# Patient Record
Sex: Female | Born: 1965
Health system: Southern US, Community
[De-identification: ages and names within clinical notes are randomized; demographics above are authoritative.]

## PROBLEM LIST (undated history)

## (undated) DIAGNOSIS — I509 Heart failure, unspecified: Secondary | ICD-10-CM

## (undated) DIAGNOSIS — K219 Gastro-esophageal reflux disease without esophagitis: Secondary | ICD-10-CM

## (undated) DIAGNOSIS — I82409 Acute embolism and thrombosis of unspecified deep veins of unspecified lower extremity: Secondary | ICD-10-CM

## (undated) DIAGNOSIS — I2699 Other pulmonary embolism without acute cor pulmonale: Secondary | ICD-10-CM

## (undated) DIAGNOSIS — R197 Diarrhea, unspecified: Secondary | ICD-10-CM

## (undated) DIAGNOSIS — J302 Other seasonal allergic rhinitis: Secondary | ICD-10-CM

## (undated) DIAGNOSIS — C801 Malignant (primary) neoplasm, unspecified: Secondary | ICD-10-CM

## (undated) DIAGNOSIS — I10 Essential (primary) hypertension: Secondary | ICD-10-CM

## (undated) HISTORY — PX: WISDOM TOOTH EXTRACTION: SHX21

---

## 2000-03-06 ENCOUNTER — Other Ambulatory Visit: Admission: RE | Admit: 2000-03-06 | Discharge: 2000-03-06 | Payer: Self-pay | Admitting: *Deleted

## 2001-08-01 ENCOUNTER — Other Ambulatory Visit: Admission: RE | Admit: 2001-08-01 | Discharge: 2001-08-01 | Payer: Self-pay | Admitting: *Deleted

## 2014-10-20 ENCOUNTER — Emergency Department
Admission: EM | Admit: 2014-10-20 | Discharge: 2014-10-20 | Disposition: A | Discharge status: Home / Self Care | Payer: Self-pay | Source: Home / Self Care | Attending: Family Medicine | Admitting: Family Medicine

## 2014-10-20 ENCOUNTER — Encounter: Payer: Self-pay | Admitting: *Deleted

## 2014-10-20 DIAGNOSIS — R300 Dysuria: Secondary | ICD-10-CM

## 2014-10-20 DIAGNOSIS — N3090 Cystitis, unspecified without hematuria: Secondary | ICD-10-CM

## 2014-10-20 HISTORY — DX: Other seasonal allergic rhinitis: J30.2

## 2014-10-20 HISTORY — DX: Gastro-esophageal reflux disease without esophagitis: K21.9

## 2014-10-20 LAB — POCT URINALYSIS DIP (MANUAL ENTRY)
Nitrite, UA: POSITIVE
Protein Ur, POC: >=300
SPEC GRAV UA: 1.015 (ref 1.005–1.03)
Urobilinogen, UA: 2 (ref 0–1)
pH, UA: 5 (ref 5–8)

## 2014-10-20 MED ORDER — SULFAMETHOXAZOLE-TRIMETHOPRIM 800-160 MG PO TABS: 1.0000 | ORAL_TABLET | Freq: Two times a day (BID) | ORAL | Status: DC

## 2014-10-20 NOTE — Discharge Instructions (Signed)
Increase fluid intake. If symptoms become significantly worse during the night or over the weekend, proceed to the local emergency room.    Urinary Tract Infection Urinary tract infections (UTIs) can develop anywhere along your urinary tract. Your urinary tract is your body's drainage system for removing wastes and extra water. Your urinary tract includes two kidneys, two ureters, a bladder, and a urethra. Your kidneys are a pair of bean-shaped organs. Each kidney is about the size of your fist. They are located below your ribs, one on each side of your spine. CAUSES Infections are caused by microbes, which are microscopic organisms, including fungi, viruses, and bacteria. These organisms are so small that they can only be seen through a microscope. Bacteria are the microbes that most commonly cause UTIs. SYMPTOMS  Symptoms of UTIs may vary by age and gender of the patient and by the location of the infection. Symptoms in young women typically include a frequent and intense urge to urinate and a painful, burning feeling in the bladder or urethra during urination. Older women and men are more likely to be tired, shaky, and weak and have muscle aches and abdominal pain. A fever may mean the infection is in your kidneys. Other symptoms of a kidney infection include pain in your back or sides below the ribs, nausea, and vomiting. DIAGNOSIS To diagnose a UTI, your caregiver will ask you about your symptoms. Your caregiver also will ask to provide a urine sample. The urine sample will be tested for bacteria and white blood cells. White blood cells are made by your body to help fight infection. TREATMENT  Typically, UTIs can be treated with medication. Because most UTIs are caused by a bacterial infection, they usually can be treated with the use of antibiotics. The choice of antibiotic and length of treatment depend on your symptoms and the type of bacteria causing your infection. HOME CARE  INSTRUCTIONS  If you were prescribed antibiotics, take them exactly as your caregiver instructs you. Finish the medication even if you feel better after you have only taken some of the medication.  Drink enough water and fluids to keep your urine clear or pale yellow.  Avoid caffeine, tea, and carbonated beverages. They tend to irritate your bladder.  Empty your bladder often. Avoid holding urine for long periods of time.  Empty your bladder before and after sexual intercourse.  After a bowel movement, women should cleanse from front to back. Use each tissue only once. SEEK MEDICAL CARE IF:   You have back pain.  You develop a fever.  Your symptoms do not begin to resolve within 3 days. SEEK IMMEDIATE MEDICAL CARE IF:   You have severe back pain or lower abdominal pain.  You develop chills.  You have nausea or vomiting.  You have continued burning or discomfort with urination. MAKE SURE YOU:   Understand these instructions.  Will watch your condition.  Will get help right away if you are not doing well or get worse. Document Released: 07/04/2005 Document Revised: 03/25/2012 Document Reviewed: 11/02/2011 [facility] Patient Information 2015 [place], [place]. This information is not intended to replace advice given to you by your health care provider. Make sure you discuss any questions you have with your health care provider.

## 2014-10-20 NOTE — ED Notes (Signed)
Pt c/o dysuria, pressure in her lower abd and urinary frequency x 3 days. Denies fever.

## 2014-10-20 NOTE — ED Provider Notes (Signed)
CSN: [id number]     Arrival date & time 10/20/14  1620 History   First MD Initiated Contact with Patient 10/20/14 1647     Chief Complaint  Patient presents with  . Dysuria      HPI Comments: Patient complains of 3 day history of dysuria, frequency, nocturia, and lower abdominal pressure.  Patient's last menstrual period was 09/30/2014.   Patient is a 49 y.o. female presenting with dysuria. The history is provided by the patient.  Dysuria Pain quality:  Burning Pain severity:  Mild Onset quality:  Gradual Duration:  3 days Timing:  Constant Progression:  Worsening Chronicity:  New Recent urinary tract infections: yes   Relieved by:  Nothing Worsened by:  Nothing tried Ineffective treatments:  Cranberry juice Urinary symptoms: frequent urination and hesitancy   Urinary symptoms: no discolored urine, no foul-smelling urine, no hematuria and no bladder incontinence   Associated symptoms: no abdominal pain, no fever, no flank pain, no genital lesions, no nausea, no vaginal discharge and no vomiting   Risk factors: recurrent urinary tract infections     Past Medical History  Diagnosis Date  . GERD (gastroesophageal reflux disease)   . Seasonal allergies    History reviewed. No pertinent past surgical history. Family History  Problem Relation Age of Onset  . COPD Mother   . Rheum arthritis Mother   . Hypertension Father    History  Substance Use Topics  . Smoking status: Current Every Day Smoker -- 1.00 packs/day    Types: Cigarettes  . Smokeless tobacco: Not on file  . Alcohol Use: Yes   OB History    No data available     Review of Systems  Constitutional: Negative for fever.  Gastrointestinal: Negative for nausea, vomiting and abdominal pain.  Genitourinary: Positive for dysuria. Negative for flank pain and vaginal discharge.  All other systems reviewed and are negative.   Allergies  Review of patient's allergies indicates no known allergies.  Home  Medications   Prior to Admission medications   Medication Sig Start Date End Date Taking? Authorizing Provider  lansoprazole (PREVACID) 30 MG capsule Take 30 mg by mouth daily at 12 noon.   Yes Historical Provider, MD  loratadine-pseudoephedrine (CLARITIN-D 24-HOUR) 10-240 MG per 24 hr tablet Take 1 tablet by mouth daily.   Yes Historical Provider, MD  sulfamethoxazole-trimethoprim (BACTRIM DS,SEPTRA DS) 800-160 MG per tablet Take 1 tablet by mouth 2 (two) times daily. 10/20/14   Kandra Nicolas, MD   BP 189/133 mmHg  Pulse 115  Temp(Src) 98 F (36.7 C) (Oral)  Resp 18  Ht 5\' 7"  (1.702 m)  Wt 184 lb (83.462 kg)  BMI 28.81 kg/m2  SpO2 98%  LMP 09/30/2014 Physical Exam Nursing notes and Vital Signs reviewed. Appearance:  Patient appears stated age, and in no acute distress Eyes:  Pupils are equal, round, and reactive to light and accomodation.  Extraocular movement is intact.  Conjunctivae are not inflamed  Pharynx:  Normal Neck:  Supple.  No adenopathy  Lungs:  Clear to auscultation.  Breath sounds are equal.  Heart:  Regular rate and rhythm without murmurs, rubs, or gallops.  Abdomen:  Nontender without masses or hepatosplenomegaly.  Bowel sounds are present.  No CVA or flank tenderness.  Extremities:  No edema.  No calf tenderness Skin:  No rash present.   ED Course  Procedures  None    Labs Reviewed  URINE CULTURE  POCT URINALYSIS DIP (MANUAL ENTRY):  Note that urinalysis dipstick  results may be inaccurate because of patient's use of AZO       MDM   1. Dysuria   2. Cystitis    Urine culture pending. Begin Bactrim DS BID for one week. Increase fluid intake. If symptoms become significantly worse during the night or over the weekend, proceed to the local emergency room.  Followup with Family Doctor if not improved in one week.     Kandra Nicolas, MD 10/24/14 2036

## 2014-10-22 LAB — URINE CULTURE

## 2014-10-25 ENCOUNTER — Telehealth: Payer: Self-pay | Admitting: *Deleted

## 2017-05-12 DIAGNOSIS — R030 Elevated blood-pressure reading, without diagnosis of hypertension: Secondary | ICD-10-CM | POA: Diagnosis not present

## 2017-05-12 DIAGNOSIS — J209 Acute bronchitis, unspecified: Secondary | ICD-10-CM | POA: Diagnosis not present

## 2017-05-12 DIAGNOSIS — R0609 Other forms of dyspnea: Secondary | ICD-10-CM | POA: Diagnosis not present

## 2017-05-12 DIAGNOSIS — R600 Localized edema: Secondary | ICD-10-CM | POA: Diagnosis not present

## 2017-05-21 ENCOUNTER — Encounter (HOSPITAL_BASED_OUTPATIENT_CLINIC_OR_DEPARTMENT_OTHER): Payer: Self-pay | Admitting: Respiratory Therapy

## 2017-05-21 ENCOUNTER — Inpatient Hospital Stay (HOSPITAL_BASED_OUTPATIENT_CLINIC_OR_DEPARTMENT_OTHER)
Admission: EM | Admit: 2017-05-21 | Discharge: 2017-06-01 | DRG: 286 | Disposition: A | Discharge status: Home / Self Care | Payer: Medicaid Other | Attending: Family Medicine | Admitting: Family Medicine

## 2017-05-21 ENCOUNTER — Emergency Department (HOSPITAL_BASED_OUTPATIENT_CLINIC_OR_DEPARTMENT_OTHER): Payer: Medicaid Other

## 2017-05-21 DIAGNOSIS — I5043 Acute on chronic combined systolic (congestive) and diastolic (congestive) heart failure: Secondary | ICD-10-CM | POA: Diagnosis present

## 2017-05-21 DIAGNOSIS — I82C11 Acute embolism and thrombosis of right internal jugular vein: Secondary | ICD-10-CM | POA: Diagnosis present

## 2017-05-21 DIAGNOSIS — N179 Acute kidney failure, unspecified: Secondary | ICD-10-CM | POA: Diagnosis present

## 2017-05-21 DIAGNOSIS — R490 Dysphonia: Secondary | ICD-10-CM

## 2017-05-21 DIAGNOSIS — J181 Lobar pneumonia, unspecified organism: Secondary | ICD-10-CM

## 2017-05-21 DIAGNOSIS — I5041 Acute combined systolic (congestive) and diastolic (congestive) heart failure: Secondary | ICD-10-CM | POA: Diagnosis not present

## 2017-05-21 DIAGNOSIS — N182 Chronic kidney disease, stage 2 (mild): Secondary | ICD-10-CM | POA: Diagnosis present

## 2017-05-21 DIAGNOSIS — I2699 Other pulmonary embolism without acute cor pulmonale: Secondary | ICD-10-CM | POA: Diagnosis present

## 2017-05-21 DIAGNOSIS — R599 Enlarged lymph nodes, unspecified: Secondary | ICD-10-CM | POA: Diagnosis not present

## 2017-05-21 DIAGNOSIS — I420 Dilated cardiomyopathy: Secondary | ICD-10-CM | POA: Diagnosis present

## 2017-05-21 DIAGNOSIS — E1122 Type 2 diabetes mellitus with diabetic chronic kidney disease: Secondary | ICD-10-CM | POA: Diagnosis present

## 2017-05-21 DIAGNOSIS — S91302A Unspecified open wound, left foot, initial encounter: Secondary | ICD-10-CM | POA: Diagnosis present

## 2017-05-21 DIAGNOSIS — Z1239 Encounter for other screening for malignant neoplasm of breast: Secondary | ICD-10-CM

## 2017-05-21 DIAGNOSIS — R590 Localized enlarged lymph nodes: Secondary | ICD-10-CM | POA: Diagnosis present

## 2017-05-21 DIAGNOSIS — I5021 Acute systolic (congestive) heart failure: Secondary | ICD-10-CM | POA: Diagnosis not present

## 2017-05-21 DIAGNOSIS — Z803 Family history of malignant neoplasm of breast: Secondary | ICD-10-CM

## 2017-05-21 DIAGNOSIS — I2510 Atherosclerotic heart disease of native coronary artery without angina pectoris: Secondary | ICD-10-CM | POA: Diagnosis not present

## 2017-05-21 DIAGNOSIS — R570 Cardiogenic shock: Secondary | ICD-10-CM | POA: Diagnosis not present

## 2017-05-21 DIAGNOSIS — Z79899 Other long term (current) drug therapy: Secondary | ICD-10-CM

## 2017-05-21 DIAGNOSIS — I509 Heart failure, unspecified: Secondary | ICD-10-CM

## 2017-05-21 DIAGNOSIS — I428 Other cardiomyopathies: Secondary | ICD-10-CM

## 2017-05-21 DIAGNOSIS — C50912 Malignant neoplasm of unspecified site of left female breast: Secondary | ICD-10-CM | POA: Diagnosis present

## 2017-05-21 DIAGNOSIS — K219 Gastro-esophageal reflux disease without esophagitis: Secondary | ICD-10-CM | POA: Diagnosis present

## 2017-05-21 DIAGNOSIS — I248 Other forms of acute ischemic heart disease: Secondary | ICD-10-CM | POA: Diagnosis present

## 2017-05-21 DIAGNOSIS — N171 Acute kidney failure with acute cortical necrosis: Secondary | ICD-10-CM | POA: Diagnosis not present

## 2017-05-21 DIAGNOSIS — E871 Hypo-osmolality and hyponatremia: Secondary | ICD-10-CM | POA: Diagnosis present

## 2017-05-21 DIAGNOSIS — Z8249 Family history of ischemic heart disease and other diseases of the circulatory system: Secondary | ICD-10-CM

## 2017-05-21 DIAGNOSIS — Z825 Family history of asthma and other chronic lower respiratory diseases: Secondary | ICD-10-CM

## 2017-05-21 DIAGNOSIS — Z6833 Body mass index (BMI) 33.0-33.9, adult: Secondary | ICD-10-CM | POA: Diagnosis not present

## 2017-05-21 DIAGNOSIS — I82409 Acute embolism and thrombosis of unspecified deep veins of unspecified lower extremity: Secondary | ICD-10-CM | POA: Diagnosis not present

## 2017-05-21 DIAGNOSIS — T17908A Unspecified foreign body in respiratory tract, part unspecified causing other injury, initial encounter: Secondary | ICD-10-CM

## 2017-05-21 DIAGNOSIS — R609 Edema, unspecified: Secondary | ICD-10-CM | POA: Diagnosis not present

## 2017-05-21 DIAGNOSIS — I82B11 Acute embolism and thrombosis of right subclavian vein: Secondary | ICD-10-CM | POA: Diagnosis present

## 2017-05-21 DIAGNOSIS — M7989 Other specified soft tissue disorders: Secondary | ICD-10-CM | POA: Diagnosis not present

## 2017-05-21 DIAGNOSIS — J189 Pneumonia, unspecified organism: Secondary | ICD-10-CM | POA: Diagnosis present

## 2017-05-21 DIAGNOSIS — R0602 Shortness of breath: Secondary | ICD-10-CM

## 2017-05-21 DIAGNOSIS — D6859 Other primary thrombophilia: Secondary | ICD-10-CM | POA: Diagnosis not present

## 2017-05-21 DIAGNOSIS — I2602 Saddle embolus of pulmonary artery with acute cor pulmonale: Secondary | ICD-10-CM | POA: Diagnosis not present

## 2017-05-21 DIAGNOSIS — I82629 Acute embolism and thrombosis of deep veins of unspecified upper extremity: Secondary | ICD-10-CM

## 2017-05-21 DIAGNOSIS — R000 Tachycardia, unspecified: Secondary | ICD-10-CM | POA: Diagnosis not present

## 2017-05-21 DIAGNOSIS — I341 Nonrheumatic mitral (valve) prolapse: Secondary | ICD-10-CM | POA: Diagnosis not present

## 2017-05-21 DIAGNOSIS — I82A11 Acute embolism and thrombosis of right axillary vein: Secondary | ICD-10-CM | POA: Diagnosis not present

## 2017-05-21 DIAGNOSIS — I130 Hypertensive heart and chronic kidney disease with heart failure and stage 1 through stage 4 chronic kidney disease, or unspecified chronic kidney disease: Secondary | ICD-10-CM | POA: Diagnosis present

## 2017-05-21 DIAGNOSIS — S91301A Unspecified open wound, right foot, initial encounter: Secondary | ICD-10-CM | POA: Diagnosis present

## 2017-05-21 DIAGNOSIS — I82A12 Acute embolism and thrombosis of left axillary vein: Secondary | ICD-10-CM | POA: Diagnosis not present

## 2017-05-21 DIAGNOSIS — Z87891 Personal history of nicotine dependence: Secondary | ICD-10-CM

## 2017-05-21 DIAGNOSIS — I472 Ventricular tachycardia: Secondary | ICD-10-CM | POA: Diagnosis present

## 2017-05-21 DIAGNOSIS — I5082 Biventricular heart failure: Secondary | ICD-10-CM | POA: Diagnosis present

## 2017-05-21 LAB — CBC
HEMATOCRIT: 41.5 % (ref 36.0–46.0)
HEMOGLOBIN: 12.7 g/dL (ref 12.0–15.0)
MCH: 27.1 pg (ref 26.0–34.0)
MCHC: 30.6 g/dL (ref 30.0–36.0)
MCV: 88.5 fL (ref 78.0–100.0)
Platelets: 269 10*3/uL (ref 150–400)
RBC: 4.69 MIL/uL (ref 3.87–5.11)
RDW: 16.2 % — AB (ref 11.5–15.5)
WBC: 22.7 10*3/uL — AB (ref 4.0–10.5)

## 2017-05-21 LAB — BASIC METABOLIC PANEL
Anion gap: 15 (ref 5–15)
BUN: 26 mg/dL — ABNORMAL HIGH (ref 6–20)
CO2: 25 mmol/L (ref 22–32)
Calcium: 8.6 mg/dL — ABNORMAL LOW (ref 8.9–10.3)
Chloride: 94 mmol/L — ABNORMAL LOW (ref 101–111)
Creatinine, Ser: 1.23 mg/dL — ABNORMAL HIGH (ref 0.44–1.00)
GFR calc Af Amer: 57 mL/min — ABNORMAL LOW (ref >60)
GFR, EST NON AFRICAN AMERICAN: 50 mL/min — AB (ref >60)
GLUCOSE: 166 mg/dL — AB (ref 65–99)
POTASSIUM: 4.3 mmol/L (ref 3.5–5.1)
Sodium: 134 mmol/L — ABNORMAL LOW (ref 135–145)

## 2017-05-21 LAB — URINALYSIS, ROUTINE W REFLEX MICROSCOPIC
Bilirubin Urine: NEGATIVE
Glucose, UA: NEGATIVE mg/dL
Ketones, ur: NEGATIVE mg/dL
Nitrite: NEGATIVE
Protein, ur: 30 mg/dL — AB
SPECIFIC GRAVITY, URINE: 1.01 (ref 1.005–1.030)
pH: 5.5 (ref 5.0–8.0)

## 2017-05-21 LAB — BRAIN NATRIURETIC PEPTIDE: B NATRIURETIC PEPTIDE 5: 2921.6 pg/mL — AB (ref 0.0–100.0)

## 2017-05-21 LAB — TROPONIN I: Troponin I: 0.48 ng/mL (ref <0.03)

## 2017-05-21 LAB — URINALYSIS, MICROSCOPIC (REFLEX)

## 2017-05-21 MED ORDER — FUROSEMIDE 10 MG/ML IJ SOLN
40.0000 mg | Freq: Once | INTRAMUSCULAR | Status: AC
  Administered 2017-05-21: 40 mg via INTRAVENOUS
  Filled 2017-05-21: qty 4

## 2017-05-21 NOTE — ED Notes (Signed)
Pt on monitor 

## 2017-05-21 NOTE — ED Provider Notes (Signed)
[facility] Provider Note   CSN: [id number] Arrival date & time: 05/21/17  2041     History   Chief Complaint Chief Complaint  Patient presents with  . Shortness of Breath    HPI Kylie Bennett is a 51 y.o. female.  Patient is a 51 year old female with no significant past medical history presenting for evaluation of shortness of breath and leg swelling. This has apparently worsened over the past 10 days. She is reporting dyspnea on exertion and orthopnea. She was initially seen in urgent care and diagnosed with bronchitis. She was treated with Augmentin, however has not improved. She returned to urgent care this evening, then sent here for further evaluation. She denies any fevers, chills, productive cough. She denies any chest pain.   The history is provided by the patient.  Shortness of Breath  This is a new problem. The average episode lasts 10 days. The problem occurs continuously.The problem has been gradually worsening. Pertinent negatives include no fever, no cough and no sputum production. Treatments tried: augmentin. Associated medical issues do not include asthma, COPD, PE or CAD.    Past Medical History:  Diagnosis Date  . GERD (gastroesophageal reflux disease)   . Seasonal allergies     There are no active problems to display for this patient.   History reviewed. No pertinent surgical history.  OB History    No data available       Home Medications    Prior to Admission medications   Medication Sig Start Date End Date Taking? Authorizing Provider  amoxicillin (AMOXIL) 500 MG capsule Take 500 mg by mouth 3 (three) times daily.   Yes [provider]  Phenylephrine-Chlorphen-DM (TUSSAFED PO) Take by mouth.   Yes [provider]  lansoprazole (PREVACID) 30 MG capsule Take 30 mg by mouth daily at 12 noon.    [provider]  loratadine-pseudoephedrine (CLARITIN-D 24-HOUR) 10-240 MG per 24 hr tablet Take 1 tablet by mouth  daily.    [provider]  sulfamethoxazole-trimethoprim (BACTRIM DS,SEPTRA DS) 800-160 MG per tablet Take 1 tablet by mouth 2 (two) times daily. 10/20/14   Kandra Nicolas, MD    Family History Family History  Problem Relation Age of Onset  . COPD Mother   . Rheum arthritis Mother   . Hypertension Father     Social History Social History  Substance Use Topics  . Smoking status: Former Smoker    Packs/day: 1.00    Types: Cigarettes  . Smokeless tobacco: Never Used  . Alcohol use Yes     Allergies   Patient has no known allergies.   Review of Systems Review of Systems  Constitutional: Negative for fever.  Respiratory: Positive for shortness of breath. Negative for cough and sputum production.   All other systems reviewed and are negative.    Physical Exam Updated Vital Signs BP (!) 145/90   Pulse (!) 125   Temp 99.7 F (37.6 C) (Oral)   Resp (!) 22   Ht 5\' 7"  (1.702 m)   SpO2 99%   Physical Exam  Constitutional: She is oriented to person, place, and time. She appears well-developed and well-nourished. No distress.  HENT:  Head: Normocephalic and atraumatic.  Neck: Normal range of motion. Neck supple.  Cardiovascular: Normal rate and regular rhythm.  Exam reveals no gallop and no friction rub.   No murmur heard. Pulmonary/Chest: Effort normal. No respiratory distress. She has no wheezes. She has rales.  There are rales in the  bases bilaterally.  Abdominal: Soft. Bowel sounds are normal. She exhibits no distension. There is no tenderness.  Musculoskeletal: Normal range of motion. She exhibits edema.  There is 3+ pitting edema of both lower extremities.  Neurological: She is alert and oriented to person, place, and time.  Skin: Skin is warm and dry. She is not diaphoretic.  Nursing note and vitals reviewed.    ED Treatments / Results  Labs (all labs ordered are listed, but only abnormal results are displayed) Labs Reviewed  CBC - Abnormal;  Notable for the following:       Result Value   WBC 22.7 (*)    RDW 16.2 (*)    All other components within normal limits  BASIC METABOLIC PANEL  TROPONIN I  BRAIN NATRIURETIC PEPTIDE    EKG  EKG Interpretation  Date/Time:  Tuesday May 21 2017 20:57:24 EDT Ventricular Rate:  129 PR Interval:    QRS Duration: 96 QT Interval:  323 QTC Calculation: 474 R Axis:   11 Text Interpretation:  Sinus tachycardia LAE, consider biatrial enlargement Borderline T wave abnormalities Confirmed by Veryl Speak [phone]) on 05/21/2017 9:01:03 PM       Radiology No results found.  Procedures Procedures (including critical care time)  Medications Ordered in ED Medications - No data to display   Initial Impression / Assessment and Plan / ED Course  I have reviewed the triage vital signs and the nursing notes.  Pertinent labs & imaging results that were available during my care of the patient were reviewed by me and considered in my medical decision making (see chart for details).  Patient sent from urgent care for further evaluation of leg swelling and shortness of breath. She has marked edema and crackles in the bases. She has an elevated troponin, markedly elevated BNP, and findings on her chest x-ray consistent with CHF/pulmonary edema. She has no history of this and I am uncertain as to what has caused it. She apparent has some sort of cardiomyopathy that I feel will require further workup. I've spoken with Dr. Loleta Books from the hospitalist service who agrees to admit. She was given IV Lasix here in the emergency department.  Final Clinical Impressions(s) / ED Diagnoses   Final diagnoses:  None    New Prescriptions New Prescriptions   No medications on file     Veryl Speak, MD 05/22/17 0001

## 2017-05-21 NOTE — Progress Notes (Signed)
51 yo F previously healthy presents with 10d progressive exertional SOB, orthopnea.  No chest pain.  Has significant new edema, sent from UC.  BP (!) 141/112   Pulse (!) 122   Temp 99.7 F (37.6 C) (Oral)   Resp (!) 22   Ht 5\' 7"  (1.702 m)   SpO2 98%   -Na 134, K 4.3, Cr 1.2 (baseline unknown), WBC 22.7K, Hgb 12.7 -CXR with effusions, cardiomegaly -Trop 0.48, BNP >3000 -To tele, inpatient for new CHF.  Got Lasix x1 at [facility]

## 2017-05-21 NOTE — ED Triage Notes (Signed)
Pt states dx with bronchitis 9 days ago, still feels SOB. She has had increased swelling in her legs since just before she was dx with bronchitis.  She went to [facility] today, they did a chest xray and said she has some fluid on her lungs.

## 2017-05-22 ENCOUNTER — Inpatient Hospital Stay (HOSPITAL_COMMUNITY): Payer: Medicaid Other

## 2017-05-22 ENCOUNTER — Encounter (HOSPITAL_COMMUNITY): Payer: Self-pay | Admitting: Family Medicine

## 2017-05-22 DIAGNOSIS — N171 Acute kidney failure with acute cortical necrosis: Secondary | ICD-10-CM

## 2017-05-22 DIAGNOSIS — I509 Heart failure, unspecified: Secondary | ICD-10-CM

## 2017-05-22 DIAGNOSIS — I341 Nonrheumatic mitral (valve) prolapse: Secondary | ICD-10-CM

## 2017-05-22 DIAGNOSIS — R000 Tachycardia, unspecified: Secondary | ICD-10-CM

## 2017-05-22 DIAGNOSIS — J181 Lobar pneumonia, unspecified organism: Secondary | ICD-10-CM

## 2017-05-22 DIAGNOSIS — E871 Hypo-osmolality and hyponatremia: Secondary | ICD-10-CM | POA: Diagnosis present

## 2017-05-22 DIAGNOSIS — R570 Cardiogenic shock: Secondary | ICD-10-CM

## 2017-05-22 DIAGNOSIS — J189 Pneumonia, unspecified organism: Secondary | ICD-10-CM | POA: Diagnosis present

## 2017-05-22 DIAGNOSIS — I420 Dilated cardiomyopathy: Secondary | ICD-10-CM

## 2017-05-22 LAB — CBC
HEMATOCRIT: 42.2 % (ref 36.0–46.0)
HEMOGLOBIN: 12.9 g/dL (ref 12.0–15.0)
MCH: 26.9 pg (ref 26.0–34.0)
MCHC: 30.6 g/dL (ref 30.0–36.0)
MCV: 88.1 fL (ref 78.0–100.0)
Platelets: 510 10*3/uL — ABNORMAL HIGH (ref 150–400)
RBC: 4.79 MIL/uL (ref 3.87–5.11)
RDW: 16 % — ABNORMAL HIGH (ref 11.5–15.5)
WBC: 23.6 10*3/uL — ABNORMAL HIGH (ref 4.0–10.5)

## 2017-05-22 LAB — ECHOCARDIOGRAM COMPLETE
CHL CUP RV SYS PRESS: 39 mmHg
E decel time: 119 msec
E/e' ratio: 11.24
FS: 12 % — AB (ref 28–44)
HEIGHTINCHES: 68 in
IVS/LV PW RATIO, ED: 1.02
LA ID, A-P, ES: 52 mm
LA diam end sys: 52 mm
LA vol A4C: 79.6 ml
LA vol index: 152.5 mL/m2
LADIAMINDEX: 8.48 cm/m2
LAVOL: 93.5 mL
LV E/e'average: 11.24
LV dias vol: 171 mL — AB (ref 46–106)
LV sys vol index: 196 mL/m2
LV sys vol: 120 mL — AB (ref 14–42)
LVDIAVOLIN: 279 mL/m2
LVEEMED: 11.24
LVELAT: 11.3 cm/s
LVOT VTI: 11.2 cm
LVOT area: 2.54 cm2
LVOT diameter: 18 mm
LVOT peak vel: 87.7 cm/s
LVOTSV: 28 mL
MV Dec: 119
MV pk E vel: 127 m/s
MVPG: 6 mmHg
PW: 10.4 mm — AB (ref 0.6–1.1)
RV LATERAL S' VELOCITY: 9.82 cm/s
RV TAPSE: 10.6 mm
Reg peak vel: 280 cm/s
Simpson's disk: 30
Stroke v: 51 ml
TDI e' lateral: 11.3
TRMAXVEL: 280 cm/s
WEIGHTICAEL: 3523.2 [oz_av]

## 2017-05-22 LAB — TSH: TSH: 4.246 u[IU]/mL (ref 0.350–4.500)

## 2017-05-22 LAB — MAGNESIUM: MAGNESIUM: 1.3 mg/dL — AB (ref 1.7–2.4)

## 2017-05-22 LAB — HIV ANTIBODY (ROUTINE TESTING W REFLEX): HIV Screen 4th Generation wRfx: NONREACTIVE

## 2017-05-22 LAB — PROCALCITONIN: Procalcitonin: 0.18 ng/mL

## 2017-05-22 LAB — TROPONIN I
Troponin I: 0.51 ng/mL (ref <0.03)
Troponin I: 0.46 ng/mL (ref <0.03)

## 2017-05-22 MED ORDER — ONDANSETRON HCL 4 MG PO TABS: 4.0000 mg | ORAL_TABLET | Freq: Four times a day (QID) | ORAL | Status: DC | PRN

## 2017-05-22 MED ORDER — PANTOPRAZOLE SODIUM 20 MG PO TBEC
20.0000 mg | DELAYED_RELEASE_TABLET | Freq: Every day | ORAL | Status: DC
  Administered 2017-05-22 – 2017-05-23 (×2): 20 mg via ORAL
  Filled 2017-05-22 (×3): qty 1

## 2017-05-22 MED ORDER — BISACODYL 10 MG RE SUPP: 10.0000 mg | Freq: Every day | RECTAL | Status: DC | PRN

## 2017-05-22 MED ORDER — CARVEDILOL 3.125 MG PO TABS: 3.1250 mg | ORAL_TABLET | Freq: Two times a day (BID) | ORAL | Status: DC

## 2017-05-22 MED ORDER — MAGNESIUM SULFATE 2 GM/50ML IV SOLN
2.0000 g | Freq: Once | INTRAVENOUS | Status: AC
  Administered 2017-05-22: 2 g via INTRAVENOUS
  Filled 2017-05-22: qty 50

## 2017-05-22 MED ORDER — PNEUMOCOCCAL VAC POLYVALENT 25 MCG/0.5ML IJ INJ
0.5000 mL | INJECTION | INTRAMUSCULAR | Status: DC
  Filled 2017-05-22: qty 0.5

## 2017-05-22 MED ORDER — GI COCKTAIL ~~LOC~~
30.0000 mL | Freq: Two times a day (BID) | ORAL | Status: DC | PRN
  Administered 2017-05-22: 30 mL via ORAL
  Filled 2017-05-22: qty 30

## 2017-05-22 MED ORDER — SPIRONOLACTONE 25 MG PO TABS
25.0000 mg | ORAL_TABLET | Freq: Every day | ORAL | Status: DC
  Administered 2017-05-22 – 2017-06-01 (×11): 25 mg via ORAL
  Filled 2017-05-22 (×11): qty 1

## 2017-05-22 MED ORDER — ONDANSETRON HCL 4 MG/2ML IJ SOLN: 4.0000 mg | Freq: Four times a day (QID) | INTRAMUSCULAR | Status: DC | PRN

## 2017-05-22 MED ORDER — ENOXAPARIN SODIUM 40 MG/0.4ML ~~LOC~~ SOLN: 40.0000 mg | SUBCUTANEOUS | Status: DC

## 2017-05-22 MED ORDER — SENNOSIDES-DOCUSATE SODIUM 8.6-50 MG PO TABS: 1.0000 | ORAL_TABLET | Freq: Every evening | ORAL | Status: DC | PRN

## 2017-05-22 MED ORDER — LISINOPRIL 5 MG PO TABS
5.0000 mg | ORAL_TABLET | Freq: Every day | ORAL | Status: DC
  Administered 2017-05-22: 5 mg via ORAL
  Filled 2017-05-22: qty 1

## 2017-05-22 MED ORDER — DIGOXIN 125 MCG PO TABS
0.1250 mg | ORAL_TABLET | Freq: Every day | ORAL | Status: DC
  Administered 2017-05-22 – 2017-06-01 (×11): 0.125 mg via ORAL
  Filled 2017-05-22 (×11): qty 1

## 2017-05-22 MED ORDER — ACETAMINOPHEN 650 MG RE SUPP: 650.0000 mg | Freq: Four times a day (QID) | RECTAL | Status: DC | PRN

## 2017-05-22 MED ORDER — FUROSEMIDE 10 MG/ML IJ SOLN
40.0000 mg | Freq: Two times a day (BID) | INTRAMUSCULAR | Status: DC
  Administered 2017-05-22: 40 mg via INTRAVENOUS
  Filled 2017-05-22: qty 4

## 2017-05-22 MED ORDER — PERFLUTREN LIPID MICROSPHERE
1.0000 mL | INTRAVENOUS | Status: AC | PRN
  Administered 2017-05-22: 2 mL via INTRAVENOUS
  Filled 2017-05-22: qty 10

## 2017-05-22 MED ORDER — POTASSIUM CHLORIDE CRYS ER 20 MEQ PO TBCR
20.0000 meq | EXTENDED_RELEASE_TABLET | Freq: Two times a day (BID) | ORAL | Status: DC
  Administered 2017-05-22 – 2017-05-27 (×13): 20 meq via ORAL
  Filled 2017-05-22 (×13): qty 1

## 2017-05-22 MED ORDER — FUROSEMIDE 10 MG/ML IJ SOLN
80.0000 mg | Freq: Three times a day (TID) | INTRAMUSCULAR | Status: DC
  Administered 2017-05-22 – 2017-05-26 (×12): 80 mg via INTRAVENOUS
  Filled 2017-05-22 (×13): qty 8

## 2017-05-22 MED ORDER — AMOXICILLIN 500 MG PO CAPS
500.0000 mg | ORAL_CAPSULE | Freq: Three times a day (TID) | ORAL | Status: AC
  Administered 2017-05-22 (×4): 500 mg via ORAL
  Filled 2017-05-22 (×4): qty 1

## 2017-05-22 MED ORDER — ACETAMINOPHEN 325 MG PO TABS
650.0000 mg | ORAL_TABLET | Freq: Four times a day (QID) | ORAL | Status: DC | PRN
  Administered 2017-05-26 – 2017-05-27 (×3): 650 mg via ORAL
  Filled 2017-05-22 (×3): qty 2

## 2017-05-22 NOTE — Progress Notes (Signed)
CRITICAL VALUE ALERT  Critical Value:  Troponin 0.51  Date & Time Notied:  05/22/2017 0321  Provider Notified: Lamar Blinks  Orders Received/Actions taken: awaiting

## 2017-05-22 NOTE — H&P (Addendum)
History and Physical  Patient Name: Kylie Bennett     GMW:[id number]    DOB: [birth year]    DOA: 05/21/2017 PCP: Patient, No Pcp Per  Patient coming from: Home --> [facility]  Chief Complaint: Dyspnea on exertion, leg swelling, orthopnea      HPI: Kylie Bennett is a 51 y.o. female with a past medical history significant for smoking who presents with subacute dyspnea on exertion.  She was in her usual state of health until about 6 weeks ago when she quit smoking. Initially she had some expected post brokers productive cough, but after about a week this developed into fever, malaise, and pretty bad cough productive of sometimes blood streaked sputum.  Around this time, she also started to notice leg swelling and dyspnea on exertion.  Over a week or two, this progressed to orthopnea, relieved with sleeping on pillows at 45 degrees.  Finally, the dyspnea progressed to be severe, she went to urgent care, diagnosed with bronchitis and started on Augmentin (no CXR was done).  Since then, her cough and sputum production resolved, but she has been left with a persistent dry cough, orthopnea, worsening leg swelling (to the point of weeping) and dyspnea on exertion, so finally she came to the ER today.  ED course: -Temp 99.44F, heart rate 122, respirations 22, blood pressure 141/112, pulse ox 98% on room air -Na 134, K 4.3, Cr 1.2 (baseline unknown), WBC 22.7K, Hgb 12.7 -CXR with effusions, cardiomegaly, small right sided opacity -ECG showed sinus tachycardia -Trop 0.48, BNP >3000 -She was given Lasix 40 mg IV and TRH were asked to evaluate for new onset CHF    She was previously a smoker, has no prior heart disease, never diagnozed with HTN, DM, stroke, MI.  Father had MI age 51s.  No family history of autoimmune disease, other CHF.         ROS: Review of Systems  Constitutional: Positive for fever (during previous episode).  Respiratory: Positive for cough, sputum production (during previous episode)  and shortness of breath.   Cardiovascular: Positive for orthopnea, leg swelling and PND. Negative for chest pain and palpitations.  All other systems reviewed and are negative.         Past Medical History:  Diagnosis Date  . GERD (gastroesophageal reflux disease)   . Seasonal allergies     History reviewed. No pertinent surgical history.  Social History: Patient lives with her father.  The patient walks unassisted.  She is unemployed.  Recent former smoker.  Drinks whiskey several times per week, maybe 1 drink, with coca-cola.  No IV drugs, no cocaine.  No Known Allergies  Family history: family history includes COPD in her mother; Hypertension in her father; Rheum arthritis in her mother.  Prior to Admission medications   Medication Sig Start Date End Date Taking? Authorizing Provider  amoxicillin (AMOXIL) 500 MG capsule Take 500 mg by mouth 3 (three) times daily.   Yes [provider]  Phenylephrine-Chlorphen-DM (TUSSAFED PO) Take by mouth.   Yes [provider]  lansoprazole (PREVACID) 30 MG capsule Take 30 mg by mouth daily at 12 noon.    [provider]  loratadine-pseudoephedrine (CLARITIN-D 24-HOUR) 10-240 MG per 24 hr tablet Take 1 tablet by mouth daily.    [provider]  sulfamethoxazole-trimethoprim (BACTRIM DS,SEPTRA DS) 800-160 MG per tablet Take 1 tablet by mouth 2 (two) times daily. 10/20/14   Kandra Nicolas, MD       Physical Exam: BP (!) 145/92 (  BP Location: Right Arm)   Pulse (!) 122   Temp 98.3 F (36.8 C) (Oral)   Resp 18   Ht 5\' 8"  (1.727 m)   Wt 99.9 kg (220 lb 3.2 oz) Comment: scale c  SpO2 100%   BMI 33.48 kg/m  General appearance: Well-developed, overweight adult female, alert and in no acute distress.   Eyes: Anicteric, conjunctiva pink, lids and lashes normal. PERRL.    ENT: No nasal deformity, discharge, epistaxis.  Hearing normal. OP moist without lesions.   Neck: No neck masses.  Trachea midline.  No  thyromegaly/tenderness. Lymph: No cervical or supraclavicular lymphadenopathy. Skin: Warm and dry.  No jaundice.  No suspicious rashes or lesions. Cardiac: Tachycardic, regular, nl S1-S2, no murmurs appreciated.  Capillary refill is brisk.  JVP not visible.  3+ LE edema.  Radial and DP pulses 2+ and symmetric. Respiratory: Tachypneic.  CTAB without rales or wheezes. Abdomen: Abdomen soft.  No TTP. No ascites, distension, hepatosplenomegaly.   MSK: No deformities or effusions.  No cyanosis or clubbing. Neuro: Cranial nerves normal.  Sensation intact to light touch. Speech is fluent.  Muscle strength normal.    Psych: Sensorium intact and responding to questions, attention normal.  Behavior appropriate.  Affect normal.  Judgment and insight appear normal.     Labs on Admission:  I have personally reviewed following labs and imaging studies: CBC:  Recent Labs Lab 05/21/17 2100  WBC 22.7*  HGB 12.7  HCT 41.5  MCV 88.5  PLT 026   Basic Metabolic Panel:  Recent Labs Lab 05/21/17 2100  NA 134*  K 4.3  CL 94*  CO2 25  GLUCOSE 166*  BUN 26*  CREATININE 1.23*  CALCIUM 8.6*   GFR: Estimated Creatinine Clearance: 66.1 mL/min (A) (by C-G formula based on SCr of 1.23 mg/dL (H)).  Cardiac Enzymes:  Recent Labs Lab 05/21/17 2100  TROPONINI 0.48*   BNP (last 3 results) BNP >3000      Radiological Exams on Admission: Personally reviewed CXR shows bilateral effusion, small, trace right lower opacity: Dg Chest 2 View  Result Date: 05/21/2017 CLINICAL DATA:  Shortness of breath EXAM: CHEST  2 VIEW COMPARISON:  None. FINDINGS: Small bilateral pleural effusions. Patchy atelectasis at the right base. Partial consolidation at the left lung base. Cardiomegaly with central vascular congestion and mild perihilar edema. No pneumothorax. IMPRESSION: 1. Small bilateral pleural effusions with left lung base atelectasis or pneumonia 2. Cardiomegaly with central vascular congestion and mild  perihilar edema Electronically Signed   By: Donavan Foil M.D.   On: 05/21/2017 22:00    EKG: Independently reviewed. Rate 129, QTc normal, lateral TW flattening, biatrial enlargement.        Assessment/Plan  1. Acute CHF:  EF unknown. Trop elevated but suspect type 2, never any chest pain, story seems nonischemic.  There does sound as if there was a bronchitis/viral URI preceding this, although it was being treated as pneumonia.  Also possible she was minimizing alcohol use.   -Furosemide 40 mg IV twice a day  -K supplement -Strict I/Os, daily weights, telemetry  -Daily monitoring renal function -Echocardiogram ordered -Check UDS -Check TSH, HIV -Trend troponin -Cardiology consultation for ischemic workup if needed, evaluation of new onset CHF   2. Community acquired pneumonia, right lower lobe:  -Continue Augmentin for 3 more doses -Check procalcitonin  3. Hyponatremia: Hypervolemic. -Monitor electrolytes with diuresis        DVT prophylaxis: Lovenox  Code Status: FULL  Family Communication: Boyfriend  and friends at bedside  Disposition Plan: Anticipate IV diuresis, echo, consult to Cardiology. Consults called: Cardiology, via inbasket Admission status: INPATIENT    Medical decision making: Patient seen at 12:30 AM on 05/22/2017.  The patient was discussed with Dr. Stark Jock.  What exists of the patient's chart was reviewed in depth and summarized above.  Clinical condition: stable.        Edwin Dada [facility] Pager [phone]     At the time of admission, it appears that the appropriate admission status for this patient is INPATIENT. This is judged to be reasonable and necessary in order to provide the required intensity of service to ensure the patient's safety given the presenting symptoms, physical exam findings, and initial radiographic and laboratory data in the context of their chronic comorbidities.  Together, these circumstances are  felt to place her at high risk for further clinical deterioration threatening life, limb, or organ.   Patient requires inpatient status due to high intensity of service, high risk for further deterioration and high frequency of surveillance required because of this new organ failure.  I certify that at the point of admission it is my clinical judgment that the patient will require inpatient hospital care spanning beyond 2 midnights from the point of admission and that early discharge would result in unnecessary risk of decompensation and readmission or threat to life, limb or bodily function.

## 2017-05-22 NOTE — Consult Note (Signed)
Cardiology Consultation:   Patient ID: Kylie Bennett; [id number]; 02-28-1966   Admit date: 05/21/2017 Date of Consult: 05/22/2017  Primary Care Provider: Patient, No Pcp Per Primary Cardiologist: new - Dr. Meda Coffee Primary Electrophysiologist:     Patient Profile:   Kylie Bennett is a 51 y.o. female with a hx of GERD who is being seen today for the evaluation of CHF at the request of Dr. Verlon Au.  History of Present Illness:   Ms. Gilcrest stopped smoking approximately 6 weeks ago. She developed productive cough, fever, malaise, occasional blood streaked sputum. She also reported bilateral leg swelling and worsening dyspnea on exertion. She reported to Urgent Care and was started on augmentin for bronchitis. This resolved her sputum production, but she continued with a dry cough, orthopnea, and worsening leg swelling and DOE. He reported to [facility].   On arrival to ED, BNP was elevated 2921.6, troponin 0.48 > 0.51 > 0.46, and CXR with cardiomegaly and vascular congestion.   Cardiology was consulted for evaluation of suspected new onset heart failure. On my interview, she states that she has had leg swelling for approximately 14 days and cough for approximately 5 weeks. She denies chest pain, palpitations, dizziness, and feelings of syncope. She states that she has had worsening dyspnea on exertion. She is currently unemployed, but completes house chores daily.    Past Medical History:  Diagnosis Date  . GERD (gastroesophageal reflux disease)   . Seasonal allergies     History reviewed. No pertinent surgical history.   Inpatient Medications: Scheduled Meds: . amoxicillin  500 mg Oral TID  . enoxaparin (LOVENOX) injection  40 mg Subcutaneous Q24H  . furosemide  40 mg Intravenous BID  . pantoprazole  20 mg Oral Daily  . [START ON 05/23/2017] pneumococcal 23 valent vaccine  0.5 mL Intramuscular Tomorrow-1000  . potassium chloride  20 mEq Oral BID   Continuous Infusions:  PRN  Meds: acetaminophen **OR** acetaminophen, bisacodyl, ondansetron **OR** ondansetron (ZOFRAN) IV, senna-docusate  Allergies:   No Known Allergies  Social History:   Social History   Social History  . Marital status: Single    Spouse name: N/A  . Number of children: N/A  . Years of education: N/A   Occupational History  . Not on file.   Social History Main Topics  . Smoking status: Former Smoker    Packs/day: 1.00    Types: Cigarettes  . Smokeless tobacco: Never Used  . Alcohol use Yes  . Drug use: No  . Sexual activity: Not on file   Other Topics Concern  . Not on file   Social History Narrative  . No narrative on file    Family History:   Family History  Problem Relation Age of Onset  . COPD Mother   . Rheum arthritis Mother   . Breast cancer Mother   . Hypertension Father   . Skin cancer Father   . Heart attack Father        age 98s  . Heart attack Paternal Grandfather   . Stroke Maternal Grandfather      ROS:  Please see the history of present illness.  ROS  All other ROS reviewed and negative.     Physical Exam/Data:   Vitals:   05/21/17 2300 05/22/17 0026 05/22/17 0555 05/22/17 0900  BP: (!) 144/112 (!) 145/92 (!) 130/91 133/88  Pulse: (!) 123 (!) 122 (!) 120 (!) 121  Resp: (!) 26 18 18 18   Temp:  98.3 F (36.8 C) 98.2  F (36.8 C) 98.9 F (37.2 C)  TempSrc:  Oral Oral Oral  SpO2: 100% 100% 100% 90%  Weight:  220 lb 3.2 oz (99.9 kg)    Height:  5\' 8"  (1.727 m)      Intake/Output Summary (Last 24 hours) at 05/22/17 1010 Last data filed at 05/22/17 0900  Gross per 24 hour  Intake              600 ml  Output              450 ml  Net              150 ml   Filed Weights   05/22/17 0026  Weight: 220 lb 3.2 oz (99.9 kg)   Body mass index is 33.48 kg/m.  General:  Well nourished, well developed, in no acute distress HEENT: normal Lymph: no adenopathy Neck: no JVD Endocrine:  No thryomegaly Vascular: No carotid bruits; FA pulses 2+  bilaterally without bruits  Cardiac:  normal rhythm, tachycardic rate, S1, S2; RRR; no murmur  Lungs:  Scattered crackles throughout, diminished in bases, respirations unlabored Abd: soft, nontender, no hepatomegaly  Ext: 2+ B LE edema Musculoskeletal:  No deformities, BUE and BLE strength normal and equal Skin: warm and dry  Neuro:  CNs 2-12 intact, no focal abnormalities noted Psych:  Normal affect   EKG:  The EKG was personally reviewed and demonstrates:  Sinus tachycardia Telemetry:  Telemetry was personally reviewed and demonstrates:  Sinus tachycardia in the 120s  Relevant CV Studies:  Echocardiogram 05/22/17: pending    Laboratory Data:  Chemistry Recent Labs Lab 05/21/17 2100  NA 134*  K 4.3  CL 94*  CO2 25  GLUCOSE 166*  BUN 26*  CREATININE 1.23*  CALCIUM 8.6*  GFRNONAA 50*  GFRAA 57*  ANIONGAP 15    No results for input(s): PROT, ALBUMIN, AST, ALT, ALKPHOS, BILITOT in the last 168 hours. Hematology Recent Labs Lab 05/21/17 2100 05/22/17 0508  WBC 22.7* 23.6*  RBC 4.69 4.79  HGB 12.7 12.9  HCT 41.5 42.2  MCV 88.5 88.1  MCH 27.1 26.9  MCHC 30.6 30.6  RDW 16.2* 16.0*  PLT 269 510*   Cardiac Enzymes Recent Labs Lab 05/21/17 2100 05/22/17 0203 05/22/17 0508  TROPONINI 0.48* 0.51* 0.46*   No results for input(s): TROPIPOC in the last 168 hours.  BNP Recent Labs Lab 05/21/17 2100  BNP 2,921.6*    DDimer No results for input(s): DDIMER in the last 168 hours.  Radiology/Studies:  Dg Chest 2 View  Result Date: 05/21/2017 CLINICAL DATA:  Shortness of breath EXAM: CHEST  2 VIEW COMPARISON:  None. FINDINGS: Small bilateral pleural effusions. Patchy atelectasis at the right base. Partial consolidation at the left lung base. Cardiomegaly with central vascular congestion and mild perihilar edema. No pneumothorax. IMPRESSION: 1. Small bilateral pleural effusions with left lung base atelectasis or pneumonia 2. Cardiomegaly with central vascular  congestion and mild perihilar edema Electronically Signed   By: Donavan Foil M.D.   On: 05/21/2017 22:00    Assessment and Plan:   1. B LE swelling - echo pending - suspect volume overload from new onset heart failure - echo will help guide medication selection - continue diuresing with 40 mg IV lasix BID - weight on admission was 220 lbs; dry weight is unknown - overall net negative 200 cc with little urine output yesterday - has only received one dose of lasix so far. Monitor output and adjust as needed -  daily BMP, weights, and strict I&Os - she denies chest pain with exertion and at rest decreasing suspicion for an ischemic cause; no arrhythmias noted on telemetry and she denies palpitations - question viral illness as a cause for this possible new onset cardiomyopathy - will start low dose of coreg for possible systolic heart failure and tachycardia - if echo has new wall motion abnormality, will consider heart catheterization tomorrow - NPO at MN until we are abel to evaluate her tomorrow  2. Elevated troponin - troponin  0.48 > 0.51 > 0.46, likely demand ischemia in the setting of elevated BNP  3. AKI - sCr 1.23 - baseline unknown - she denies previous kidney problems  4. CAP - right lower lobe - finish augmentin   5. Sinus tachycardia, HTN - tachycardia likely related to infection and anxiety (she is highly anxious) - start coreg as above - d-dimer pending  6. Elevated platelets - PLT 510 today from 269 - monitor daily labs and start ASA if they continue to trend up  Signed, Ledora Bottcher, PA  05/22/2017 10:10 AM   The patient was seen, examined and discussed with Minette Brine , PA-C and I agree with the above.   A very pleasant female with no significant PMH, FH of MI in her father and 30 years of smoking, quit 6 weeks ago. Her symptoms started approximately 6 weeks ago when she started to feel worsening shortness of breath, approximately 2 weeks ago she  went to urgent care complaining of cough lower extremity edema and fever, she is prescribed antibiotics that didn't improve any of her symptoms, she became progressively more short of breath to the point she can walk to the bathroom, she had 2 pillow orthopnea and paroxysmal nocturnal dyspnea, lower extremity edema to the point that they're losing fluid. She had mild pressure-like chest pain no palpitations or syncope.  On physical exam this is a female in mild respiratory distress while talking, sitting upright appears tachypneic, with elevated JVDs afterward jaws, crackles bilaterally up to the mid lungs, worse right than left, severe 3+ lower extremity edema with fluid leakage around her heels bilaterally.  Chest x-ray shows pulmonary edema. Labs show creatinine of 1.23, potassium 4.3, troponin 0.48, 0.51, 0.46. BNP of 2900. Her white count is elevated at 23,000. TSH 4.2.  She denies any family history of dilated cardiomyopathy no family history of sudden cardiac death.  I have personally reviewed her echocardiogram that shows: - Severe LV dilatation and systolic dysfunction with LVEF 15-20%,   diffuse hypokinesis, grade 3 diastolic dysfunction with elevated   filling pressures.   Severe RV dilatation with moderate systolic dysfunction, RVSP 46   mmHg.     Severely dilated left ventricle and left atrium together with   grade 3 diastolic dysfunction consistent with a chronic process   rather than acute etiology.   Assessment and plan:  Acute combined systolic and diastolic heart failure, vitals stable at this point but patient is at high risk for cardiogenic stroke, we will discontinue carvedilol, start Lasix 80 mg IV every 8 hours, start spironolactone 25 mg by mouth daily, start digoxin 0.125 mg by mouth daily. We will reevaluate in the morning and if she is able to lay flat we'll plan for left and right cardiac catheterization tomorrow.  Ena Dawley, MD 05/22/2017

## 2017-05-22 NOTE — Progress Notes (Signed)
  Echocardiogram 2D Echocardiogram has been performed.  Kaye Luoma L Androw 05/22/2017, 1:31 PM

## 2017-05-22 NOTE — Progress Notes (Signed)
Agree with history and physical as per Dr. and deferred earlier this morning 1/67   51 year old female with sparse medical history Recent smoker until 04/2017 Note that patient declined labs or chest x-ray at that visit Hypertension  For the past 5 weeks patient has been feeling poorly since quitting smoking She developed bronchitic-like symptoms Recently seen at no vomiting treated for bronchitis? Antibiotics-Augmentin  Documentation notes lower leg edema shortness of breath with activity and yellow-green sputum  Return to urgent care 8/15 a.m. x-ray revealed fluid in lungs Function be in sinus tachycardia and borderline T-wave abnormalities on EKG in the emergency room BUN/creatinine 26/1.2 ProBNP 2.9 K POC troponin 0.4 with flat trend WBC 22 Thrombocytosis of 510 TSH 4.24 CXR 2 view = cardiomegaly central vascular congestion or she marked atelectasis pneumonia left lung base   On exam ,BP (!) 130/91 (BP Location: Left Arm)   Pulse (!) 120   Temp 98.2 F (36.8 C) (Oral)   Resp 18   Ht 5\' 8"  (1.727 m)   Wt 99.9 kg (220 lb 3.2 oz) Comment: scale c  SpO2 100%   BMI 33.48 kg/m  Does not have JVD Anasarca Rales posteriorly at the bases Abdomen soft  Plan Acute undetermined CHF  Lasix 40 twice a day as per admitting physician, follow echocardiogram, follow UDS, cardiology consult  Pending  Expect will need at least 2-3 days of aggressive diuresis    Community-acquired pneumonia  Augmentin and continued-procalcitonin 0.186 can safely continue by mouth  Mild hyponatremia-probably related to CHF  Aquaresis with Lasix will help  Prior smoker  Congratulation on recent cessation efforts and encouraged to continue the same  Morbid obesity  Body mass index is 33.48 kg/m.   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   Discussed with fiance at  bedside Expect she will be here at least till the weekend diuresis actively   Verneita Griffes, [place] [phone]

## 2017-05-22 NOTE — Progress Notes (Signed)
Seen and examined by Dr. Loleta Books.

## 2017-05-22 NOTE — Progress Notes (Addendum)
Heart Failure Navigator Consult Note  Presentation: per Dr Loleta Books Kylie Bennett is a 51 y.o. female with a past medical history significant for smoking who presents with subacute dyspnea on exertion.  She was in her usual state of health until about 6 weeks ago when she quit smoking. Initially she had some expected post brokers productive cough, but after about a week this developed into fever, malaise, and pretty bad cough productive of sometimes blood streaked sputum.  Around this time, she also started to notice leg swelling and dyspnea on exertion.  Over a week or two, this progressed to orthopnea, relieved with sleeping on pillows at 45 degrees.  Finally, the dyspnea progressed to be severe, she went to urgent care, diagnosed with bronchitis and started on Augmentin (no CXR was done).  Since then, her cough and sputum production resolved, but she has been left with a persistent dry cough, orthopnea, worsening leg swelling (to the point of weeping) and dyspnea on exertion, so finally she came to the ER today.  Past Medical History:  Diagnosis Date  . GERD (gastroesophageal reflux disease)   . Seasonal allergies     Social History   Social History  . Marital status: Single    Spouse name: N/A  . Number of children: N/A  . Years of education: N/A   Social History Main Topics  . Smoking status: Former Smoker    Packs/day: 1.00    Types: Cigarettes  . Smokeless tobacco: Never Used  . Alcohol use Yes  . Drug use: No  . Sexual activity: Not Asked   Other Topics Concern  . None   Social History Narrative  . None    ECHO: pending  BNP    Component Value Date/Time   BNP 2,921.6 (H) 05/21/2017 2100    ProBNP No results found for: PROBNP   Education Assessment and Provision:  Detailed education and instructions provided on heart failure disease management including the following:  Signs and symptoms of Heart Failure When to call the physician Importance of daily  weights Low sodium diet Fluid restriction Medication management Anticipated future follow-up appointments  Patient education given on each of the above topics.  Patient acknowledges understanding and acceptance of all instructions.  I spoke with Kylie Bennett regarding her new HF diagnosis.  She tells me that she is quite overwhelmed with all the new information and "trying to process it as it comes".  She is awaiting an Echo today.  She asked appropriate questions and seems very motivated to make changes necessary for her health.  I reviewed the diagnosis and HF recommendations for home.  She has a scale at home and we discussed the importance of daily weights/ when to call the physician.  I reviewed a low sodium diet and high sodium foods to avoid.  She denies any foreseen issues getting or taking prescribed medications-(although she has no insurance noted).  She will follow at [facility].  Education Materials:  "Living Better With Heart Failure" Booklet, Daily Weight Tracker Tool .   High Risk Criteria for Readmission and/or Poor Patient Outcomes:    EF <30%- pending  2 or more admissions in 6 months- No new HF  Difficult social situation- No  Demonstrates medication noncompliance- No denies  Barriers of Care:  New HF- Knowledge and compliance  Discharge Planning:   Plans to return home with husband to [place]

## 2017-05-23 ENCOUNTER — Inpatient Hospital Stay (HOSPITAL_COMMUNITY): Payer: Medicaid Other

## 2017-05-23 LAB — HCG, SERUM, QUALITATIVE: PREG SERUM: NEGATIVE

## 2017-05-23 LAB — CBC WITH DIFFERENTIAL/PLATELET
BASOS PCT: 0 %
Basophils Absolute: 0 10*3/uL (ref 0.0–0.1)
EOS PCT: 0 %
Eosinophils Absolute: 0 10*3/uL (ref 0.0–0.7)
HCT: 41.5 % (ref 36.0–46.0)
Hemoglobin: 12.9 g/dL (ref 12.0–15.0)
LYMPHS ABS: 3.5 10*3/uL (ref 0.7–4.0)
Lymphocytes Relative: 16 %
MCH: 27.4 pg (ref 26.0–34.0)
MCHC: 31.1 g/dL (ref 30.0–36.0)
MCV: 88.1 fL (ref 78.0–100.0)
MONO ABS: 2.2 10*3/uL — AB (ref 0.1–1.0)
Monocytes Relative: 10 %
Neutro Abs: 16.4 10*3/uL — ABNORMAL HIGH (ref 1.7–7.7)
Neutrophils Relative %: 74 %
PLATELETS: 478 10*3/uL — AB (ref 150–400)
RBC: 4.71 MIL/uL (ref 3.87–5.11)
RDW: 16.1 % — AB (ref 11.5–15.5)
WBC: 22.1 10*3/uL — AB (ref 4.0–10.5)

## 2017-05-23 LAB — D-DIMER, QUANTITATIVE: D-Dimer, Quant: 19.27 ug/mL-FEU — ABNORMAL HIGH (ref 0.00–0.50)

## 2017-05-23 LAB — BASIC METABOLIC PANEL
Anion gap: 17 — ABNORMAL HIGH (ref 5–15)
BUN: 19 mg/dL (ref 6–20)
CHLORIDE: 93 mmol/L — AB (ref 101–111)
CO2: 28 mmol/L (ref 22–32)
CREATININE: 0.88 mg/dL (ref 0.44–1.00)
Calcium: 8.7 mg/dL — ABNORMAL LOW (ref 8.9–10.3)
GFR calc Af Amer: >60 mL/min (ref >60)
GFR calc non Af Amer: >60 mL/min (ref >60)
Glucose, Bld: 121 mg/dL — ABNORMAL HIGH (ref 65–99)
Potassium: 3.5 mmol/L (ref 3.5–5.1)
SODIUM: 138 mmol/L (ref 135–145)

## 2017-05-23 LAB — PROTIME-INR
INR: 1.33
Prothrombin Time: 16.6 seconds — ABNORMAL HIGH (ref 11.4–15.2)

## 2017-05-23 LAB — HEPARIN LEVEL (UNFRACTIONATED)

## 2017-05-23 LAB — MAGNESIUM: Magnesium: 1.5 mg/dL — ABNORMAL LOW (ref 1.7–2.4)

## 2017-05-23 MED ORDER — IOPAMIDOL (ISOVUE-370) INJECTION 76%
INTRAVENOUS | Status: AC
  Filled 2017-05-23: qty 100

## 2017-05-23 MED ORDER — TECHNETIUM TC 99M DIETHYLENETRIAME-PENTAACETIC ACID
30.0000 | Freq: Once | INTRAVENOUS | Status: AC | PRN
  Administered 2017-05-23: 30 via RESPIRATORY_TRACT

## 2017-05-23 MED ORDER — HEPARIN BOLUS VIA INFUSION
2000.0000 [IU] | Freq: Once | INTRAVENOUS | Status: AC
Start: 2017-05-23 — End: 2017-05-23
  Administered 2017-05-23: 2000 [IU] via INTRAVENOUS
  Filled 2017-05-23: qty 2000

## 2017-05-23 MED ORDER — HEPARIN (PORCINE) IN NACL 100-0.45 UNIT/ML-% IJ SOLN
1750.0000 [IU]/h | INTRAMUSCULAR | Status: DC
  Administered 2017-05-23: 1400 [IU]/h via INTRAVENOUS
  Administered 2017-05-24: 1700 [IU]/h via INTRAVENOUS
  Administered 2017-05-24 – 2017-05-25 (×2): 2000 [IU]/h via INTRAVENOUS
  Administered 2017-05-27: 1750 [IU]/h via INTRAVENOUS
  Filled 2017-05-23 (×7): qty 250

## 2017-05-23 MED ORDER — TECHNETIUM TC 99M MERTIATIDE
4.0000 | Freq: Once | INTRAVENOUS | Status: AC | PRN
  Administered 2017-05-23: 4 via INTRAVENOUS

## 2017-05-23 MED ORDER — HEPARIN BOLUS VIA INFUSION
4000.0000 [IU] | INTRAVENOUS | Status: AC
  Administered 2017-05-23: 4000 [IU] via INTRAVENOUS
  Filled 2017-05-23: qty 4000

## 2017-05-23 MED ORDER — AMOXICILLIN-POT CLAVULANATE 875-125 MG PO TABS
1.0000 | ORAL_TABLET | Freq: Two times a day (BID) | ORAL | Status: DC
  Administered 2017-05-23 – 2017-05-25 (×5): 1 via ORAL
  Filled 2017-05-23 (×5): qty 1

## 2017-05-23 MED ORDER — LORAZEPAM 2 MG/ML IJ SOLN
0.5000 mg | Freq: Four times a day (QID) | INTRAMUSCULAR | Status: DC | PRN
  Administered 2017-05-23: 0.5 mg via INTRAVENOUS
  Filled 2017-05-23 (×2): qty 1

## 2017-05-23 NOTE — Progress Notes (Signed)
ANTICOAGULATION CONSULT NOTE - Follow Up Consult  Pharmacy Consult for Heparin  Indication: High prob PE per VQ No Known Allergies  Patient Measurements: Height: 5\' 8"  (172.7 cm) Weight: 212 lb 4.8 oz (96.3 kg) (scale c) IBW/kg (Calculated) : 63.9 Heparin Dosing Weight: 85 kg  Vital Signs: Temp: 98 F (36.7 C) (08/16 2020) Temp Source: Oral (08/16 2020) BP: 138/93 (08/16 2020) Pulse Rate: 124 (08/16 2020)  Labs:  Recent Labs  05/21/17 2100 05/22/17 0203 05/22/17 0508 05/23/17 0408 05/23/17 1958  HGB 12.7  --  12.9 12.9  --   HCT 41.5  --  42.2 41.5  --   PLT 269  --  510* 478*  --   LABPROT  --   --   --  16.6*  --   INR  --   --   --  1.33  --   HEPARINUNFRC  --   --   --   --  <0.10*  CREATININE 1.23*  --   --  0.88  --   TROPONINI 0.48* 0.51* 0.46*  --   --     Estimated Creatinine Clearance: 90.8 mL/min (by C-G formula based on SCr of 0.88 mg/dL).   Medical History: Past Medical History:  Diagnosis Date  . GERD (gastroesophageal reflux disease)   . Seasonal allergies     Medications:  Prescriptions Prior to Admission  Medication Sig Dispense Refill Last Dose  . [EXPIRED] amoxicillin-clavulanate (AUGMENTIN) 875-125 MG tablet Take 1 tablet by mouth every 12 (twelve) hours.   05/21/2017 at 1000  . fluticasone (FLONASE) 50 MCG/ACT nasal spray Place 2 sprays into both nostrils daily as needed for allergies.    Past Week at Unknown time  . omeprazole (PRILOSEC) 20 MG capsule Take 20 mg by mouth daily.   05/21/2017 at Unknown time  . [DISCONTINUED] loratadine-pseudoephedrine (CLARITIN-D 24-HOUR) 10-240 MG per 24 hr tablet Take 1 tablet by mouth daily.     . [DISCONTINUED] Phenylephrine-Chlorphen-DM (TUSSAFED PO) Take by mouth.     . [DISCONTINUED] sulfamethoxazole-trimethoprim (BACTRIM DS,SEPTRA DS) 800-160 MG per tablet Take 1 tablet by mouth 2 (two) times daily. 14 tablet 0    Scheduled:  . amoxicillin-clavulanate  1 tablet Oral Q12H  . digoxin  0.125 mg Oral  Daily  . furosemide  80 mg Intravenous Q8H  . iopamidol      . pantoprazole  20 mg Oral Daily  . pneumococcal 23 valent vaccine  0.5 mL Intramuscular Tomorrow-1000  . potassium chloride  20 mEq Oral BID  . spironolactone  25 mg Oral Daily    Assessment: 51 y.o female started on IV heparin  VTE Treatment for High prob PE per VQ.  D-Dimer is 19.27.    INR baseline is 1.33, Patient was not on anticoagulation PTA. H/H is wnl, pltc is elevated.  No bleeding reported currently.  Recent h/o occasional blood streaked sputum noted with productive cough, fever, malaise.  Heparin drip 1400 uts/hr running with no problem per RN - HL < 0.1 less than goal  Goal of Therapy:  Heparin level 0.3-0.7 units/ml Monitor platelets by anticoagulation protocol: Yes   Plan:  rebolus Heparin 2000 units IV x1 Increase Heparin drip rate 1700 units/hr 6 hr heparin level Daily HL, CBC while on IV heparin infusion.  Bonnita Nasuti Pharm.D. CPP, BCPS Clinical Pharmacist [phone] 05/23/2017 9:41 PM

## 2017-05-23 NOTE — Progress Notes (Signed)
Progress Note  Patient Name: Kylie Bennett Date of Encounter: 05/23/2017  Primary Cardiologist: new patient  Subjective   She feels better today, mildly improved breathing and less tightness in legs, she was finally able to sleep.    Inpatient Medications    Scheduled Meds: . digoxin  0.125 mg Oral Daily  . enoxaparin (LOVENOX) injection  40 mg Subcutaneous Q24H  . furosemide  80 mg Intravenous Q8H  . pantoprazole  20 mg Oral Daily  . pneumococcal 23 valent vaccine  0.5 mL Intramuscular Tomorrow-1000  . potassium chloride  20 mEq Oral BID  . spironolactone  25 mg Oral Daily   Continuous Infusions:  PRN Meds: acetaminophen **OR** acetaminophen, bisacodyl, gi cocktail, ondansetron **OR** ondansetron (ZOFRAN) IV, senna-docusate   Vital Signs    Vitals:   05/22/17 1955 05/22/17 2338 05/23/17 0630 05/23/17 0811  BP: (!) 136/97 131/89 (!) 130/97 131/88  Pulse: (!) 121 (!) 123 (!) 117 (!) 120  Resp: 20 18 18 20   Temp: 98.8 F (37.1 C)  98.5 F (36.9 C) 97.6 F (36.4 C)  TempSrc: Oral  Oral Oral  SpO2: 95% 94% 95% 96%  Weight:   212 lb 4.8 oz (96.3 kg)   Height:        Intake/Output Summary (Last 24 hours) at 05/23/17 0844 Last data filed at 05/23/17 0411  Gross per 24 hour  Intake              840 ml  Output             4300 ml  Net            -3460 ml   Filed Weights   05/22/17 0026 05/23/17 0630  Weight: 220 lb 3.2 oz (99.9 kg) 212 lb 4.8 oz (96.3 kg)    Telemetry    ST - Personally Reviewed  ECG    ST - Personally Reviewed  Physical Exam   GEN: No acute distress.   Neck: No JVD Cardiac: RRR, no murmurs, rubs, or gallops.  Respiratory: crackles up to mid lungs bilaterally. GI: Soft, nontender, distended  MS: B/L 3+ edema with oozing; No deformity. Neuro:  Nonfocal  Psych: Normal affect   Labs    Chemistry Recent Labs Lab 05/21/17 2100 05/23/17 0408  NA 134* 138  K 4.3 3.5  CL 94* 93*  CO2 25 28  GLUCOSE 166* 121*  BUN 26* 19    CREATININE 1.23* 0.88  CALCIUM 8.6* 8.7*  GFRNONAA 50* >60  GFRAA 57* >60  ANIONGAP 15 17*     Hematology Recent Labs Lab 05/21/17 2100 05/22/17 0508 05/23/17 0408  WBC 22.7* 23.6* 22.1*  RBC 4.69 4.79 4.71  HGB 12.7 12.9 12.9  HCT 41.5 42.2 41.5  MCV 88.5 88.1 88.1  MCH 27.1 26.9 27.4  MCHC 30.6 30.6 31.1  RDW 16.2* 16.0* 16.1*  PLT 269 510* 478*    Cardiac Enzymes Recent Labs Lab 05/21/17 2100 05/22/17 0203 05/22/17 0508  TROPONINI 0.48* 0.51* 0.46*   No results for input(s): TROPIPOC in the last 168 hours.   BNP Recent Labs Lab 05/21/17 2100  BNP 2,921.6*     DDimer  Recent Labs Lab 05/23/17 0133  DDIMER 19.27*     Radiology    Dg Chest 2 View  Result Date: 05/21/2017 CLINICAL DATA:  Shortness of breath EXAM: CHEST  2 VIEW COMPARISON:  None. FINDINGS: Small bilateral pleural effusions. Patchy atelectasis at the right base. Partial consolidation at the left lung  base. Cardiomegaly with central vascular congestion and mild perihilar edema. No pneumothorax. IMPRESSION: 1. Small bilateral pleural effusions with left lung base atelectasis or pneumonia 2. Cardiomegaly with central vascular congestion and mild perihilar edema Electronically Signed   By: Donavan Foil M.D.   On: 05/21/2017 22:00    Cardiac Studies   - Left ventricle: The cavity size was severely dilated. There was   mild concentric hypertrophy. Systolic function was normal. The   estimated ejection fraction was in the range of 15% to 20%.   Diffuse hypokinesis. Doppler parameters are consistent with   restrictive physiology, indicative of decreased left ventricular   diastolic compliance and/or increased left atrial pressure.   Doppler parameters are consistent with elevated ventricular   end-diastolic filling pressure. - Mitral valve: There was mild regurgitation. - Left atrium: The atrium was severely dilated. - Right ventricle: The cavity size was severely dilated. Wall   thickness  was normal. Systolic function was moderately reduced. - Right atrium: The atrium was moderately dilated. - Tricuspid valve: There was severe regurgitation. - Pulmonary arteries: Systolic pressure was moderately increased.   PA peak pressure: 46 mm Hg (S).  Impressions:  - Severe LV dilatation and systolic dysfunction with LVEF 15-20%,   diffuse hypokinesis, grade 3 diastolic dysfunction with elevated   filling pressures.   Severe RV dilatation with moderate systolic dysfunction, RVSP 46   mmHg.     Severely dilated left ventricle and left atrium together with   grade 3 diastolic dysfunction consistent with a chronic process   rather than acute etiology.  Patient Profile     51 y.o. 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 Her symptoms started approximately 6 weeks ago when she started to feel worsening shortness of breath, approximately 2 weeks ago she went to urgent care complaining of cough lower extremity edema and fever, she is prescribed antibiotics that didn't improve any of her symptoms, she became progressively more short of breath to the point she can walk to the bathroom, she had 2 pillow orthopnea and paroxysmal nocturnal dyspnea, lower extremity edema to the point that they're losing fluid. She had mild pressure-like chest pain no palpitations or syncope.  On physical exam this is a female in mild respiratory distress while talking, sitting upright appears tachypneic, with elevated JVDs afterward jaws, crackles bilaterally up to the mid lungs, worse right than left, severe 3+ lower extremity edema with fluid leakage around her heels bilaterally.  Chest x-ray shows pulmonary edema. Labs show creatinine of 1.23, potassium 4.3, troponin 0.48, 0.51, 0.46. BNP of 2900. Her white count is elevated at 23,000. TSH  4.2.  She denies any family history of dilated cardiomyopathy no family history of sudden cardiac death.  I have personally reviewed her echocardiogram that shows: - Severe LV dilatation and systolic dysfunction with LVEF 15-20%, diffuse hypokinesis, grade 3 diastolic dysfunction with elevated filling pressures. Severe RV dilatation with moderate systolic dysfunction, RVSP 46 mmHg.  Severely dilated left ventricle and left atrium together with grade 3 diastolic dysfunction consistent with a chronic process rather than acute etiology.   Assessment and plan:  Acute combined systolic and diastolic heart failure, RV failure  She was started on iv Lasix with good response overnight - 8 lbs 220-->212 lbs. Some improvement in symptoms, still  significant fluid overload (at least 20 lbs) and symptoms. D dimet 19, plan is for VQ scan and cardiac cath R+ L based on PE presence. Start Heparin for now. Continue lasix 80 mg iv Q6H, spironolactone, avoid BB for now, continue digoxin.   Signed, Ena Dawley, MD  05/23/2017, 8:44 AM

## 2017-05-23 NOTE — Care Management Note (Signed)
Case Management Note  Patient Details  Name: Iliza Blankenbeckler MRN: [id number] Date of Birth: [birth year]  Subjective/Objective:                 Spoke w patient at bedside. Admitted from home for CHF, new onset. Lives in [place]. Patient follow up appointment made at Patient care Center ([facility]) Aug 28 at 1:00. Patient will likely need [place] letter, however can follow up with [facility] for Rx at time of DC if M-F by 5:00. CM provided patient with Medicaid application. Watch for oxygen needs at DC.    Action/Plan:   Expected Discharge Date:  05/25/17               Expected Discharge Plan:     In-House Referral:     Discharge planning Services  CM Consult  Post Acute Care Choice:    Choice offered to:     DME Arranged:    DME Agency:     HH Arranged:    HH Agency:     Status of Service:  In process, will continue to follow  If discussed at Long Length of Stay Meetings, dates discussed:    Additional Comments:  Carles Collet, RN 05/23/2017, 11:54 AM

## 2017-05-23 NOTE — Progress Notes (Signed)
ANTICOAGULATION CONSULT NOTE - Initial Consult  Pharmacy Consult for Heparin  Indication: r/o PE No Known Allergies  Patient Measurements: Height: 5\' 8"  (172.7 cm) Weight: 212 lb 4.8 oz (96.3 kg) (scale c) IBW/kg (Calculated) : 63.9 Heparin Dosing Weight: 85 kg  Vital Signs: Temp: 97.9 F (36.6 C) (08/16 1207) Temp Source: Oral (08/16 1207) BP: 138/90 (08/16 1207) Pulse Rate: 110 (08/16 1207)  Labs:  Recent Labs  05/21/17 2100 05/22/17 0203 05/22/17 0508 05/23/17 0408  HGB 12.7  --  12.9 12.9  HCT 41.5  --  42.2 41.5  PLT 269  --  510* 478*  LABPROT  --   --   --  16.6*  INR  --   --   --  1.33  CREATININE 1.23*  --   --  0.88  TROPONINI 0.48* 0.51* 0.46*  --     Estimated Creatinine Clearance: 90.8 mL/min (by C-G formula based on SCr of 0.88 mg/dL).   Medical History: Past Medical History:  Diagnosis Date  . GERD (gastroesophageal reflux disease)   . Seasonal allergies     Medications:  Prescriptions Prior to Admission  Medication Sig Dispense Refill Last Dose  . [EXPIRED] amoxicillin-clavulanate (AUGMENTIN) 875-125 MG tablet Take 1 tablet by mouth every 12 (twelve) hours.   05/21/2017 at 1000  . fluticasone (FLONASE) 50 MCG/ACT nasal spray Place 2 sprays into both nostrils daily as needed for allergies.    Past Week at Unknown time  . omeprazole (PRILOSEC) 20 MG capsule Take 20 mg by mouth daily.   05/21/2017 at Unknown time  . [DISCONTINUED] loratadine-pseudoephedrine (CLARITIN-D 24-HOUR) 10-240 MG per 24 hr tablet Take 1 tablet by mouth daily.     . [DISCONTINUED] Phenylephrine-Chlorphen-DM (TUSSAFED PO) Take by mouth.     . [DISCONTINUED] sulfamethoxazole-trimethoprim (BACTRIM DS,SEPTRA DS) 800-160 MG per tablet Take 1 tablet by mouth 2 (two) times daily. 14 tablet 0    Scheduled:  . digoxin  0.125 mg Oral Daily  . enoxaparin (LOVENOX) injection  40 mg Subcutaneous Q24H  . furosemide  80 mg Intravenous Q8H  . iopamidol      . pantoprazole  20 mg Oral  Daily  . pneumococcal 23 valent vaccine  0.5 mL Intramuscular Tomorrow-1000  . potassium chloride  20 mEq Oral BID  . spironolactone  25 mg Oral Daily    Assessment: 51 y.o female to start on IV heparin  VTE Treatment for presumed PE.  D-Dimer is 19.27.  VQ scan pending.  INR baseline is 1.33, Patient was not on anticoagulation PTA. H/H is wnl, pltc is elevated.  No bleeding reported currently.  Recent h/o occasional blood streaked sputum noted with productive cough, fever, malaise.    Goal of Therapy:  Heparin level 0.3-0.7 units/ml Monitor platelets by anticoagulation protocol: Yes   Plan:  Heparin 4000 units IV x1 Heparin drip rate 1400 units/hr 6 hr heparin level Daily HL, CBC while on IV heparin infusion.  Thank you for allowing pharmacy to be part of this patients care team. Nicole Cella, [place] Clinical Pharmacist Pager: [phone] 8A-4P [phone]-10P [phone] [place] [phone] 05/23/2017,1:09 PM

## 2017-05-23 NOTE — Progress Notes (Signed)
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

## 2017-05-23 NOTE — Progress Notes (Signed)
PROGRESS NOTE    Kylie Bennett  MOQ:[id number] DOB: 1966-07-29 DOA: 05/21/2017 PCP: Patient, No Pcp Per  Outpatient Specialists:     Brief Narrative:   51 year old female with sparse medical history Recent smoker until 04/2017 Note that patient declined labs or chest x-ray at that visit Hypertension  For the past 5 weeks patient has been feeling poorly since quitting smoking She developed bronchitic-like symptoms Recently seen at no vomiting treated for bronchitis? Antibiotics-Augmentin Has had multiple short trips out of town, none specifically > 4 hours duration             Documentation notes lower leg edema shortness of breath with activity and yellow-green sputum   Assessment & Plan:   Principal Problem:   Acute CHF (congestive heart failure) ([place]) Active Problems:   Hyponatremia   Community acquired pneumonia of right lower lobe of lung ([place])   Acute systolic + diastolic CHF Ef 35%--? NICM 2/2 to PNA? Grade 3 Diastolic parameter   Lasix 40 twice a day as per admitting physician,   Cardiac cath r/o ischemia when able to lie flat [unable to so far]  I/o so far = -5.3, weight uncleaR  D Dimer 19 Recent long travels, smoker-not on estrogen replacement  R/o PE  Vq scan> CT chest as will be exposed to contrast for Cardiac cath  --if neg would initiate possible AutoImune vs malignancy work-up--PNA on its own usually wouldn't be etiology    Reasonable to start low-dose IV heparin for presumed pulmonary embolism and monitor             Community-acquired pneumonia  Still afebrile             Augmentin and continued-procalcitonin 0.186 can safely continue by mouth  Mild hyponatremia-probably related to CHF             Aquaresis with Lasix will help  Sodium 133--138 now  Prior smoker             Congratulation on recent cessation efforts and encouraged to continue the same  Morbid obesity  Body mass index is 33.48 kg/m.              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   Objective: Vitals:   05/22/17 1955 05/22/17 2338 05/23/17 0630 05/23/17 0811  BP: (!) 136/97 131/89 (!) 130/97 131/88  Pulse: (!) 121 (!) 123 (!) 117 (!) 120  Resp: 20 18 18 20   Temp: 98.8 F (37.1 C)  98.5 F (36.9 C) 97.6 F (36.4 C)  TempSrc: Oral  Oral Oral  SpO2: 95% 94% 95% 96%  Weight:   96.3 kg (212 lb 4.8 oz)   Height:        Intake/Output Summary (Last 24 hours) at 05/23/17 1137 Last data filed at 05/23/17 1022  Gross  per 24 hour  Intake              480 ml  Output             5100 ml  Net            -4620 ml   Filed Weights   05/22/17 0026 05/23/17 0630  Weight: 99.9 kg (220 lb 3.2 oz) 96.3 kg (212 lb 4.8 oz)    Examination:  General exam: Appears calm and comfortable  Respiratory system: Clear to auscultation. Respiratory effort normal. Cardiovascular system: S1 & S2 heard, RRR. No JVD, murmurs, rubs, gallops or clicks. No pedal edema. Gastrointestinal system: Abdomen is nondistended, soft and nontender. No organomegaly or masses felt. Normal bowel sounds heard. Central nervous system: Alert and oriented. No focal neurological deficits. Extremities: Symmetric 5 x 5 power. Skin: No rashes, lesions or ulcers Psychiatry: Judgement and insight appear normal. Mood & affect appropriate.     Data Reviewed: I  have personally reviewed following labs and imaging studies  CBC:  Recent Labs Lab 05/21/17 2100 05/22/17 0508 05/23/17 0408  WBC 22.7* 23.6* 22.1*  NEUTROABS  --   --  16.4*  HGB 12.7 12.9 12.9  HCT 41.5 42.2 41.5  MCV 88.5 88.1 88.1  PLT 269 510* 063*   Basic Metabolic Panel:  Recent Labs Lab 05/21/17 2100 05/22/17 0203 05/23/17 0408  NA 134*  --  138  K 4.3  --  3.5  CL 94*  --  93*  CO2 25  --  28  GLUCOSE 166*  --  121*  BUN 26*  --  19  CREATININE 1.23*  --  0.88  CALCIUM 8.6*  --  8.7*  MG  --  1.3* 1.5*   GFR: Estimated Creatinine Clearance: 90.8 mL/min (by C-G formula based on SCr of 0.88 mg/dL). Liver Function Tests: No results for input(s): AST, ALT, ALKPHOS, BILITOT, PROT, ALBUMIN in the last 168 hours. No results for input(s): LIPASE, AMYLASE in the last 168 hours. No results for input(s): AMMONIA in the last 168 hours. Coagulation Profile:  Recent Labs Lab 05/23/17 0408  INR 1.33   Cardiac Enzymes:  Recent Labs Lab 05/21/17 2100 05/22/17 0203 05/22/17 0508  TROPONINI 0.48* 0.51* 0.46*   BNP (last 3 results) No results for input(s): PROBNP in the last 8760 hours. HbA1C: No results for input(s): HGBA1C in the last 72 hours. CBG: No results for input(s): GLUCAP in the last 168 hours. Lipid Profile: No results for input(s): CHOL, HDL, LDLCALC, TRIG, CHOLHDL, LDLDIRECT in the last 72 hours. Thyroid Function Tests:  Recent Labs  05/22/17 0203  TSH 4.246   Anemia Panel: No results for input(s): VITAMINB12, FOLATE, FERRITIN, TIBC, IRON, RETICCTPCT in the last 72 hours. Urine analysis:    Component Value Date/Time   COLORURINE YELLOW 05/21/2017 2320   APPEARANCEUR CLOUDY (A) 05/21/2017 2320   LABSPEC 1.010 05/21/2017 2320   PHURINE 5.5 05/21/2017 2320   GLUCOSEU NEGATIVE 05/21/2017 2320   HGBUR SMALL (A) 05/21/2017 2320   BILIRUBINUR NEGATIVE 05/21/2017 2320   BILIRUBINUR moderate 10/20/2014 1654   KETONESUR NEGATIVE 05/21/2017  2320   PROTEINUR 30 (A) 05/21/2017 2320   UROBILINOGEN 2.0 10/20/2014 1654   NITRITE NEGATIVE 05/21/2017 2320   LEUKOCYTESUR SMALL (A) 05/21/2017 2320   Sepsis Labs: @LABRCNTIP (procalcitonin:4,lacticidven:4)  )No results found for this or any previous visit (from the past 240 hour(s)).       Radiology Studies: Dg Chest 2 View  Result Date: 05/21/2017  CLINICAL DATA:  Shortness of breath EXAM: CHEST  2 VIEW COMPARISON:  None. FINDINGS: Small bilateral pleural effusions. Patchy atelectasis at the right base. Partial consolidation at the left lung base. Cardiomegaly with central vascular congestion and mild perihilar edema. No pneumothorax. IMPRESSION: 1. Small bilateral pleural effusions with left lung base atelectasis or pneumonia 2. Cardiomegaly with central vascular congestion and mild perihilar edema Electronically Signed   By: Donavan Foil M.D.   On: 05/21/2017 22:00        Scheduled Meds: . digoxin  0.125 mg Oral Daily  . enoxaparin (LOVENOX) injection  40 mg Subcutaneous Q24H  . furosemide  80 mg Intravenous Q8H  . iopamidol      . pantoprazole  20 mg Oral Daily  . pneumococcal 23 valent vaccine  0.5 mL Intramuscular Tomorrow-1000  . potassium chloride  20 mEq Oral BID  . spironolactone  25 mg Oral Daily   Continuous Infusions:   LOS: 2 days    Time spent: [place], MD [facility] ([facility]   If 7PM-7AM, please contact night-coverage [email or web address] Password [facility] 05/23/2017, 11:37 AM

## 2017-05-24 DIAGNOSIS — I2602 Saddle embolus of pulmonary artery with acute cor pulmonale: Secondary | ICD-10-CM

## 2017-05-24 LAB — COMPREHENSIVE METABOLIC PANEL
ALBUMIN: 2 g/dL — AB (ref 3.5–5.0)
ALT: 22 U/L (ref 14–54)
ANION GAP: 19 — AB (ref 5–15)
AST: 47 U/L — AB (ref 15–41)
Alkaline Phosphatase: 75 U/L (ref 38–126)
BUN: 19 mg/dL (ref 6–20)
CHLORIDE: 93 mmol/L — AB (ref 101–111)
CO2: 26 mmol/L (ref 22–32)
Calcium: 8.2 mg/dL — ABNORMAL LOW (ref 8.9–10.3)
Creatinine, Ser: 0.88 mg/dL (ref 0.44–1.00)
GFR calc Af Amer: >60 mL/min (ref >60)
GFR calc non Af Amer: >60 mL/min (ref >60)
GLUCOSE: 124 mg/dL — AB (ref 65–99)
POTASSIUM: 3.7 mmol/L (ref 3.5–5.1)
Sodium: 138 mmol/L (ref 135–145)
TOTAL PROTEIN: 5.9 g/dL — AB (ref 6.5–8.1)
Total Bilirubin: 1.8 mg/dL — ABNORMAL HIGH (ref 0.3–1.2)

## 2017-05-24 LAB — HEPARIN LEVEL (UNFRACTIONATED)
HEPARIN UNFRACTIONATED: 0.42 [IU]/mL (ref 0.30–0.70)
Heparin Unfractionated: 0.47 IU/mL (ref 0.30–0.70)

## 2017-05-24 LAB — CBC
HCT: 42 % (ref 36.0–46.0)
Hemoglobin: 12.8 g/dL (ref 12.0–15.0)
MCH: 26.6 pg (ref 26.0–34.0)
MCHC: 30.5 g/dL (ref 30.0–36.0)
MCV: 87.3 fL (ref 78.0–100.0)
PLATELETS: 473 10*3/uL — AB (ref 150–400)
RBC: 4.81 MIL/uL (ref 3.87–5.11)
RDW: 16.1 % — AB (ref 11.5–15.5)
WBC: 18.1 10*3/uL — AB (ref 4.0–10.5)

## 2017-05-24 LAB — MAGNESIUM: MAGNESIUM: 1.2 mg/dL — AB (ref 1.7–2.4)

## 2017-05-24 LAB — PROCALCITONIN: PROCALCITONIN: 0.17 ng/mL

## 2017-05-24 MED ORDER — MAGNESIUM OXIDE 400 (241.3 MG) MG PO TABS
400.0000 mg | ORAL_TABLET | Freq: Two times a day (BID) | ORAL | Status: DC
  Administered 2017-05-24 (×2): 400 mg via ORAL
  Filled 2017-05-24 (×2): qty 1

## 2017-05-24 MED ORDER — MAGNESIUM SULFATE 2 GM/50ML IV SOLN
2.0000 g | Freq: Once | INTRAVENOUS | Status: AC
Start: 2017-05-24 — End: 2017-05-24
  Administered 2017-05-24: 2 g via INTRAVENOUS
  Filled 2017-05-24: qty 50

## 2017-05-24 MED ORDER — OMEPRAZOLE 20 MG PO CPDR
20.0000 mg | DELAYED_RELEASE_CAPSULE | Freq: Every day | ORAL | Status: DC
  Administered 2017-05-24 – 2017-06-01 (×5): 20 mg via ORAL
  Filled 2017-05-24 (×9): qty 1

## 2017-05-24 MED ORDER — HEPARIN BOLUS VIA INFUSION
2000.0000 [IU] | Freq: Once | INTRAVENOUS | Status: AC
  Administered 2017-05-24: 2000 [IU] via INTRAVENOUS
  Filled 2017-05-24: qty 2000

## 2017-05-24 MED ORDER — METOPROLOL TARTRATE 12.5 MG HALF TABLET: 12.5000 mg | ORAL_TABLET | Freq: Two times a day (BID) | ORAL | Status: DC

## 2017-05-24 NOTE — Progress Notes (Addendum)
Progress Note  Patient Name: Kylie Bennett Date of Encounter: 05/24/2017  Primary Cardiologist: new patient  Subjective   She feels better today, improvement of swelling in the legs.    Inpatient Medications    Scheduled Meds: . amoxicillin-clavulanate  1 tablet Oral Q12H  . digoxin  0.125 mg Oral Daily  . furosemide  80 mg Intravenous Q8H  . pantoprazole  20 mg Oral Daily  . pneumococcal 23 valent vaccine  0.5 mL Intramuscular Tomorrow-1000  . potassium chloride  20 mEq Oral BID  . spironolactone  25 mg Oral Daily   Continuous Infusions: . heparin 2,000 Units/hr (05/24/17 0700)   PRN Meds: acetaminophen **OR** acetaminophen, bisacodyl, gi cocktail, LORazepam, ondansetron **OR** ondansetron (ZOFRAN) IV, senna-docusate   Vital Signs    Vitals:   05/23/17 0811 05/23/17 1207 05/23/17 2020 05/24/17 0446  BP: 131/88 138/90 (!) 138/93 (!) 141/98  Pulse: (!) 120 (!) 110 (!) 124 (!) 103  Resp: 20 16 18 20   Temp: 97.6 F (36.4 C) 97.9 F (36.6 C) 98 F (36.7 C) 99.1 F (37.3 C)  TempSrc: Oral Oral Oral Oral  SpO2: 96% 91% 96% 96%  Weight:    204 lb 9.6 oz (92.8 kg)  Height:        Intake/Output Summary (Last 24 hours) at 05/24/17 0816 Last data filed at 05/24/17 0804  Gross per 24 hour  Intake            54.83 ml  Output             5350 ml  Net         -5295.17 ml   Filed Weights   05/22/17 0026 05/23/17 0630 05/24/17 0446  Weight: 220 lb 3.2 oz (99.9 kg) 212 lb 4.8 oz (96.3 kg) 204 lb 9.6 oz (92.8 kg)    Telemetry    ST, 9 beats of nsVT  ECG    ST - Personally Reviewed  Physical Exam   GEN: No acute distress.   Neck: No JVD Cardiac: RRR, no murmurs, rubs, or gallops.  Respiratory: crackles up to mid lungs bilaterally. GI: Soft, nontender, distended  MS: B/L 2+ edema no oozing; No deformity. Neuro:  Nonfocal  Psych: Normal affect   Labs    Chemistry  Recent Labs Lab 05/21/17 2100 05/23/17 0408 05/24/17 0457  NA 134* 138 138  K 4.3 3.5  3.7  CL 94* 93* 93*  CO2 25 28 26   GLUCOSE 166* 121* 124*  BUN 26* 19 19  CREATININE 1.23* 0.88 0.88  CALCIUM 8.6* 8.7* 8.2*  PROT  --   --  5.9*  ALBUMIN  --   --  2.0*  AST  --   --  47*  ALT  --   --  22  ALKPHOS  --   --  75  BILITOT  --   --  1.8*  GFRNONAA 50* >60 >60  GFRAA 57* >60 >60  ANIONGAP 15 17* 19*     Hematology  Recent Labs Lab 05/22/17 0508 05/23/17 0408 05/24/17 0457  WBC 23.6* 22.1* 18.1*  RBC 4.79 4.71 4.81  HGB 12.9 12.9 12.8  HCT 42.2 41.5 42.0  MCV 88.1 88.1 87.3  MCH 26.9 27.4 26.6  MCHC 30.6 31.1 30.5  RDW 16.0* 16.1* 16.1*  PLT 510* 478* 473*    Cardiac Enzymes  Recent Labs Lab 05/21/17 2100 05/22/17 0203 05/22/17 0508  TROPONINI 0.48* 0.51* 0.46*   No results for input(s): TROPIPOC in the last  168 hours.   BNP  Recent Labs Lab 05/21/17 2100  BNP 2,921.6*     DDimer   Recent Labs Lab 05/23/17 0133  DDIMER 19.27*     Radiology    Dg Chest 2 View  Result Date: 05/23/2017 CLINICAL DATA:  Shortness of breath EXAM: CHEST  2 VIEW COMPARISON:  05/21/2017 FINDINGS: Small bilateral effusions.  Right lung grossly clear. Persistent consolidation in the left lower lobe with streaky opacity at the lingula. No significant change. Borderline cardiomegaly. No pneumothorax. IMPRESSION: 1. Small bilateral pleural effusions 2. No significant interval change in lingular and left lower lobe consolidation Electronically Signed   By: Donavan Foil M.D.   On: 05/23/2017 19:03   Nm Pulmonary Perf And Vent  Result Date: 05/23/2017 CLINICAL DATA:  Positive D-dimer suspect pulmonary embolus EXAM: NUCLEAR MEDICINE VENTILATION - PERFUSION LUNG SCAN TECHNIQUE: Ventilation images were obtained in multiple projections using inhaled aerosol Tc-66m DTPA. Perfusion images were obtained in multiple projections after intravenous injection of Tc-38m MAA. RADIOPHARMACEUTICALS:  30 mCi Technetium-69m DTPA aerosol inhalation and 4 mCi Technetium-83m MAA IV  COMPARISON:  Chest radiograph 05/23/2017 FINDINGS: Ventilation: Large defect in the left lower lobe, corresponding to radiographic abnormality. Perfusion: At least 2 large peripheral defects in the apical and lateral aspect of the right lung with relatively normal perfusion and no corresponding opacity on radiograph. Large matched defect in the left lower lobe as well. IMPRESSION: Findings are consistent with high probability for pulmonary embolus. Critical Value/emergent results were called by telephone at the time of interpretation on 05/23/2017 at 7:14 pm to Dr. Lovey Newcomer, who verbally acknowledged these results. Electronically Signed   By: Donavan Foil M.D.   On: 05/23/2017 19:14    Cardiac Studies   - Left ventricle: The cavity size was severely dilated. There was   mild concentric hypertrophy. Systolic function was normal. The   estimated ejection fraction was in the range of 15% to 20%.   Diffuse hypokinesis. Doppler parameters are consistent with   restrictive physiology, indicative of decreased left ventricular   diastolic compliance and/or increased left atrial pressure.   Doppler parameters are consistent with elevated ventricular   end-diastolic filling pressure. - Mitral valve: There was mild regurgitation. - Left atrium: The atrium was severely dilated. - Right ventricle: The cavity size was severely dilated. Wall   thickness was normal. Systolic function was moderately reduced. - Right atrium: The atrium was moderately dilated. - Tricuspid valve: There was severe regurgitation. - Pulmonary arteries: Systolic pressure was moderately increased.   PA peak pressure: 46 mm Hg (S).  Impressions:  - Severe LV dilatation and systolic dysfunction with LVEF 15-20%,   diffuse hypokinesis, grade 3 diastolic dysfunction with elevated   filling pressures.   Severe RV dilatation with moderate systolic dysfunction, RVSP 46   mmHg.     Severely dilated left ventricle and left atrium  together with   grade 3 diastolic dysfunction consistent with a chronic process   rather than acute etiology.  Patient Profile     51 y.o. 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 Her symptoms started approximately 6 weeks ago when she started to feel worsening shortness of breath, approximately 2 weeks ago she went to urgent care complaining of cough lower extremity edema and fever, she is  prescribed antibiotics that didn't improve any of her symptoms, she became progressively more short of breath to the point she can walk to the bathroom, she had 2 pillow orthopnea and paroxysmal nocturnal dyspnea, lower extremity edema to the point that they're losing fluid. She had mild pressure-like chest pain no palpitations or syncope.  On physical exam this is a female in mild respiratory distress while talking, sitting upright appears tachypneic, with elevated JVDs afterward jaws, crackles bilaterally up to the mid lungs, worse right than left, severe 3+ lower extremity edema with fluid leakage around her heels bilaterally.  Chest x-ray shows pulmonary edema. Labs show creatinine of 1.23, potassium 4.3, troponin 0.48, 0.51, 0.46. BNP of 2900. Her white count is elevated at 23,000. TSH 4.2.  She denies any family history of dilated cardiomyopathy no family history of sudden cardiac death.  I have personally reviewed her echocardiogram that shows: - Severe LV dilatation and systolic dysfunction with LVEF 15-20%, diffuse hypokinesis, grade 3 diastolic dysfunction with elevated filling pressures. Severe RV dilatation with moderate systolic dysfunction, RVSP 46 mmHg.  Severely dilated left ventricle and left atrium together with grade 3 diastolic dysfunction consistent with a chronic process rather than acute  etiology.   Assessment and plan:  Acute combined systolic and diastolic heart failure, RV failure  She was started on iv Lasix with good response overnight - 8 lbs 220-->212 -->204 lbs. Some improvement in symptoms, still significant fluid overload (at least 20 lbs) and symptoms.  D dimet 19, VQ scan consistent with PE, she was started on Heparin drip, we will continue till Monday, when we will plan for a left heart catheterization.  Continue lasix 80 mg iv Q6H, spironolactone, avoid BB for now, continue digoxin. Add losartan 12.5 mg po daily. She had an episode of 9 beats of nsVT this am, hold BB in te settings of an acute decompensated CHF, if ongoing frequent ectopy, we will arrange for a Lifevest at discharge. K, MG will be corrected.  Signed, Ena Dawley, MD  05/24/2017, 8:16 AM

## 2017-05-24 NOTE — Progress Notes (Addendum)
PROGRESS NOTE    Kylie Bennett  DJM:[id number] DOB: [birth year] DOA: 05/21/2017 PCP: Patient, No Pcp Per  Outpatient Specialists:     Brief Narrative:   51 year old female with sparse medical history Recent smoker until 04/2017 Note that patient declined labs or chest x-ray at that visit Hypertension  For the past 5 weeks patient has been feeling poorly since quitting smoking She developed bronchitic-like symptoms Recently seen at no vomiting treated for bronchitis? Antibiotics-Augmentin Has had multiple short trips out of town, none specifically > 4 hours duration             Documentation notes lower leg edema shortness of breath with activity and yellow-green sputum   Assessment & Plan:   Principal Problem:   Acute CHF (congestive heart failure) ([place]) Active Problems:   Hyponatremia   Community acquired pneumonia of right lower lobe of lung ([place])   Acute systolic + diastolic CHF Ef 22%--? NICM 2/2 to PNA? Grade 3 Diastolic parameter  Lasix 40 ii/day-->80ii/day/cardiology 8/17  Aldactone 25 started 8/50  Digoxin 0.125 started 8/15--patient also has had episodic 9 beats of V. tach-agree patient should probably not be on a beta blocker in no EF status acutely but cardiac irritability may be worsened by low magnesium-replacing aggressively with IV magnesium 2 g today plus by mouth 400 twice a day with recheck a.m. 8/18  Cardiac cath planned for 8/20?  I/o so far = -8.7--weight 212-->204  Creatinine is stable with diuresis however magnesium is 1.2 so will replace relatively aggressively as below  Appreciate cardiology input  D Dimer 19 Recent long travels, smoker-not on estrogen replacement + PE on V/Q scan    Continue  IV heparin for pulmonary embolism   Will need noveal oral anti-coagulant after catheterization which will probably be on 8/20  Would consider this as a provoked DVT given recent travel , immobility -given her young age however a 90 , recommend as an out  ration  autoimmune panel and screening as well as workup for more rare causes of procoagulant state such as malignancy etc. etc.              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              Needs outpatient counseling regarding the same  might benefit from bariatric referral versus structured weight loss program   Discussed with fiance at bedside We'll be here over the weekend diuresis and will probably need cardiac including potential other workup 8/20   Consultants:   Cardiology  Procedures:   VQ scan8/16 high probability PE   Cardiac cath is pending  Antimicrobials:   Augmentin    Subjective:  Desats to high 80s but has improved  Lower extremity swelling is slightly better A little bit anxious and somewhat frustrated No chest pain No nausea nor vomiting but is requesting omeprazole >pantoprazole as this is her home medication No sputum No diarrhea nor dark stool   Objective: Vitals:   05/23/17 0811 05/23/17 1207 05/23/17 2020 05/24/17 0446  BP: 131/88 138/90 (!) 138/93 (!) 141/98  Pulse: (!) 120 (!) 110 (!) 124 (!) 103  Resp: 20 16 18 20   Temp: 97.6 F (36.4 C) 97.9 F (36.6 C) 98 F (36.7 C) 99.1 F (37.3 C)  TempSrc: Oral Oral Oral Oral  SpO2: 96% 91% 96% 96%  Weight:    92.8 kg (204  lb 9.6 oz)  Height:        Intake/Output Summary (Last 24 hours) at 05/24/17 1016 Last data filed at 05/24/17 0930  Gross per 24 hour  Intake           294.83 ml  Output             4850 ml  Net         -4555.17 ml   Filed Weights   05/22/17 0026 05/23/17 0630 05/24/17 0446  Weight: 99.9 kg (220 lb 3.2 oz) 96.3 kg (212 lb 4.8 oz) 92.8 kg (204 lb 9.6 oz)    Examination:  Alert obese pleasant no distress EOMI Mallampati 3 Cannot appreciate JVD secondary to habitus-no bruit M3-N3 holosystolic murmur Chest is clinically clear without added sound Abdomen soft nontender no rebound no guarding Grade 3 lower extremity edema Range of motion intact euthymic and congruent  Data Reviewed: I have personally reviewed following labs and imaging studies  CBC:  Recent Labs Lab 05/21/17 2100 05/22/17 0508 05/23/17 0408 05/24/17 0457  WBC 22.7* 23.6* 22.1* 18.1*  NEUTROABS  --   --  16.4*  --   HGB 12.7 12.9 12.9 12.8  HCT 41.5 42.2 41.5 42.0  MCV 88.5 88.1 88.1 87.3  PLT 269 510* 478* 614*   Basic Metabolic Panel:  Recent Labs Lab 05/21/17 2100 05/22/17 0203 05/23/17 0408 05/24/17 0457  NA 134*  --  138 138  K 4.3  --  3.5 3.7  CL 94*  --  93* 93*  CO2 25  --  28 26  GLUCOSE 166*  --  121* 124*  BUN 26*  --  19 19  CREATININE 1.23*  --  0.88 0.88  CALCIUM 8.6*  --  8.7* 8.2*  MG  --  1.3* 1.5* 1.2*   GFR: Estimated Creatinine Clearance: 89.1 mL/min (by C-G formula based on SCr of 0.88 mg/dL). Liver Function Tests:  Recent Labs Lab 05/24/17 0457  AST 47*  ALT 22  ALKPHOS 75  BILITOT 1.8*  PROT 5.9*  ALBUMIN 2.0*   No results for input(s): LIPASE, AMYLASE in the last 168 hours. No results for input(s): AMMONIA in the last 168 hours. Coagulation Profile:  Recent Labs Lab 05/23/17 0408  INR 1.33   Cardiac Enzymes:  Recent Labs Lab 05/21/17 2100 05/22/17 0203 05/22/17 0508  TROPONINI 0.48* 0.51* 0.46*   BNP (last 3 results) No results for  input(s): PROBNP in the last 8760 hours. HbA1C: No results for input(s): HGBA1C in the last 72 hours. CBG: No results for input(s): GLUCAP in the last 168 hours. Lipid Profile: No results for input(s): CHOL, HDL, LDLCALC, TRIG, CHOLHDL, LDLDIRECT in the last 72 hours. Thyroid Function Tests:  Recent Labs  05/22/17 0203  TSH 4.246   Anemia Panel: No results for input(s): VITAMINB12,  FOLATE, FERRITIN, TIBC, IRON, RETICCTPCT in the last 72 hours. Urine analysis:    Component Value Date/Time   COLORURINE YELLOW 05/21/2017 2320   APPEARANCEUR CLOUDY (A) 05/21/2017 2320   LABSPEC 1.010 05/21/2017 2320   PHURINE 5.5 05/21/2017 2320   GLUCOSEU NEGATIVE 05/21/2017 2320   HGBUR SMALL (A) 05/21/2017 2320   BILIRUBINUR NEGATIVE 05/21/2017 2320   BILIRUBINUR moderate 10/20/2014 1654   KETONESUR NEGATIVE 05/21/2017 2320   PROTEINUR 30 (A) 05/21/2017 2320   UROBILINOGEN 2.0 10/20/2014 1654   NITRITE NEGATIVE 05/21/2017 2320   LEUKOCYTESUR SMALL (A) 05/21/2017 2320   Sepsis Labs: @LABRCNTIP (procalcitonin:4,lacticidven:4)  )No results found for this or any previous visit (from the past 240 hour(s)).       Radiology Studies: Dg Chest 2 View  Result Date: 05/23/2017 CLINICAL DATA:  Shortness of breath EXAM: CHEST  2 VIEW COMPARISON:  05/21/2017 FINDINGS: Small bilateral effusions.  Right lung grossly clear. Persistent consolidation in the left lower lobe with streaky opacity at the lingula. No significant change. Borderline cardiomegaly. No pneumothorax. IMPRESSION: 1. Small bilateral pleural effusions 2. No significant interval change in lingular and left lower lobe consolidation Electronically Signed   By: Donavan Foil M.D.   On: 05/23/2017 19:03   Nm Pulmonary Perf And Vent  Result Date: 05/23/2017 CLINICAL DATA:  Positive D-dimer suspect pulmonary embolus EXAM: NUCLEAR MEDICINE VENTILATION - PERFUSION LUNG SCAN TECHNIQUE: Ventilation images were obtained in multiple projections  using inhaled aerosol Tc-71m DTPA. Perfusion images were obtained in multiple projections after intravenous injection of Tc-64m MAA. RADIOPHARMACEUTICALS:  30 mCi Technetium-7m DTPA aerosol inhalation and 4 mCi Technetium-18m MAA IV COMPARISON:  Chest radiograph 05/23/2017 FINDINGS: Ventilation: Large defect in the left lower lobe, corresponding to radiographic abnormality. Perfusion: At least 2 large peripheral defects in the apical and lateral aspect of the right lung with relatively normal perfusion and no corresponding opacity on radiograph. Large matched defect in the left lower lobe as well. IMPRESSION: Findings are consistent with high probability for pulmonary embolus. Critical Value/emergent results were called by telephone at the time of interpretation on 05/23/2017 at 7:14 pm to Dr. Lovey Newcomer, who verbally acknowledged these results. Electronically Signed   By: Donavan Foil M.D.   On: 05/23/2017 19:14        Scheduled Meds: . amoxicillin-clavulanate  1 tablet Oral Q12H  . digoxin  0.125 mg Oral Daily  . furosemide  80 mg Intravenous Q8H  . metoprolol tartrate  12.5 mg Oral BID  . omeprazole  20 mg Oral Daily  . pneumococcal 23 valent vaccine  0.5 mL Intramuscular Tomorrow-1000  . potassium chloride  20 mEq Oral BID  . spironolactone  25 mg Oral Daily   Continuous Infusions: . heparin 2,000 Units/hr (05/24/17 0700)     LOS: 3 days    Time spent: [place], MD [facility] ([facility]   If 7PM-7AM, please contact night-coverage [email or web address] Password [email or web address] 05/24/2017, 10:16 AM

## 2017-05-24 NOTE — Psychosocial Assessment (Signed)
Patient says protonix does not work for her at all and wants to know if we carry omeprazole. If not she would like to bring her own from home. Nursing will notify MD.

## 2017-05-24 NOTE — Progress Notes (Signed)
ANTICOAGULATION CONSULT NOTE - Follow Up Consult  Pharmacy Consult for Heparin  Indication: pulmonary embolus  No Known Allergies  Patient Measurements: Height: 5\' 8"  (172.7 cm) Weight: 204 lb 9.6 oz (92.8 kg) IBW/kg (Calculated) : 63.9  Vital Signs: Temp: 99.1 F (37.3 C) (08/17 0446) Temp Source: Oral (08/17 0446) BP: 141/98 (08/17 0446) Pulse Rate: 103 (08/17 0446)  Labs:  Recent Labs  05/21/17 2100 05/22/17 0203 05/22/17 0508 05/23/17 0408 05/23/17 1958 05/24/17 0457  HGB 12.7  --  12.9 12.9  --  12.8  HCT 41.5  --  42.2 41.5  --  42.0  PLT 269  --  510* 478*  --  473*  LABPROT  --   --   --  16.6*  --   --   INR  --   --   --  1.33  --   --   HEPARINUNFRC  --   --   --   --  <0.10* <0.10*  CREATININE 1.23*  --   --  0.88  --  0.88  TROPONINI 0.48* 0.51* 0.46*  --   --   --     Estimated Creatinine Clearance: 89.1 mL/min (by C-G formula based on SCr of 0.88 mg/dL).   Assessment: Heparin for new onset PE, heparin level remains low, no issues per RN.   Goal of Therapy:  Heparin level 0.3-0.7 units/ml Monitor platelets by anticoagulation protocol: Yes   Plan:  Heparin 2000 units [employer] heparin drip to 2000 units/hr 1300 HL  Kylie Bennett 05/24/2017,6:30 AM

## 2017-05-24 NOTE — Progress Notes (Signed)
Called by RN for NSVT run 9 beats. Resting HR > 100. Will add low dose beta blocker.  Kerin Ransom PA-C 05/24/2017 9:50 AM

## 2017-05-24 NOTE — Progress Notes (Signed)
Patient states she wants her night dose of lasix scheduled earlier than 10pm, pharmacy notified.

## 2017-05-24 NOTE — Progress Notes (Signed)
ANTICOAGULATION CONSULT NOTE - Follow Up Consult  Pharmacy Consult for Heparin  Indication: pulmonary embolus  No Known Allergies  Patient Measurements: Height: 5\' 8"  (172.7 cm) Weight: 204 lb 9.6 oz (92.8 kg) IBW/kg (Calculated) : 63.9 kg Heparin Dosing Weight: 85 kg  Vital Signs: Temp: 98 F (36.7 C) (08/17 1356) Temp Source: Oral (08/17 1356) BP: 124/82 (08/17 1356) Pulse Rate: 121 (08/17 1356)  Labs:  Recent Labs  05/21/17 2100 05/22/17 0203 05/22/17 0508 05/23/17 0408 05/23/17 1958 05/24/17 0457 05/24/17 1345  HGB 12.7  --  12.9 12.9  --  12.8  --   HCT 41.5  --  42.2 41.5  --  42.0  --   PLT 269  --  510* 478*  --  473*  --   LABPROT  --   --   --  16.6*  --   --   --   INR  --   --   --  1.33  --   --   --   HEPARINUNFRC  --   --   --   --  <0.10* <0.10* 0.47  CREATININE 1.23*  --   --  0.88  --  0.88  --   TROPONINI 0.48* 0.51* 0.46*  --   --   --   --     Estimated Creatinine Clearance: 89.1 mL/min (by C-G formula based on SCr of 0.88 mg/dL).   Assessment: Heparin for new onset PE, heparin levels were low <0.1 previously and heparin drip rebolus given & rate increased to 2000 units/hr this AM.   RN later notes IV possibly infiltrated and started a new IV site with heparin infusing at new site ~ 10:30 this AM.   Heparin level = 0.47 on 2000 units/hr, level drawn ~ 3.25 hr after new IV site obtained. This level is therapeutic.  Will recheck level at 18:00 tonight for ~ 6hr level.   Goal of Therapy:  Heparin level 0.3-0.7 units/ml Monitor platelets by anticoagulation protocol: Yes   Plan:  Continue IV Heparin drip to 2000 units/hr Recheck heparin level @ 18:00 tonight (~ 6hr after new IV site obtained) Daily HL, CBC.   Nicole Cella, RPh Clinical Pharmacist Pager: [phone] 05/24/2017,4:18 PM

## 2017-05-24 NOTE — Progress Notes (Signed)
Patient off monitor due to telemetry box not working, will find another box for patient.

## 2017-05-24 NOTE — Progress Notes (Signed)
ANTICOAGULATION CONSULT NOTE - Follow Up Consult  Pharmacy Consult for Heparin  Indication: pulmonary embolus  No Known Allergies  Patient Measurements: Height: 5\' 8"  (172.7 cm) Weight: 204 lb 9.6 oz (92.8 kg) IBW/kg (Calculated) : 63.9 kg Heparin Dosing Weight: 85 kg  Vital Signs: Temp: 98 F (36.7 C) (08/17 1356) Temp Source: Oral (08/17 1356) BP: 124/82 (08/17 1356) Pulse Rate: 121 (08/17 1356)  Labs:  Recent Labs  05/22/17 0203  05/22/17 0508 05/23/17 0408  05/24/17 0457 05/24/17 1345 05/24/17 1938  HGB  --   < > 12.9 12.9  --  12.8  --   --   HCT  --   --  42.2 41.5  --  42.0  --   --   PLT  --   --  510* 478*  --  473*  --   --   LABPROT  --   --   --  16.6*  --   --   --   --   INR  --   --   --  1.33  --   --   --   --   HEPARINUNFRC  --   --   --   --   < > <0.10* 0.47 0.42  CREATININE  --   --   --  0.88  --  0.88  --   --   TROPONINI 0.51*  --  0.46*  --   --   --   --   --   < > = values in this interval not displayed.  Estimated Creatinine Clearance: 89.1 mL/min (by C-G formula based on SCr of 0.88 mg/dL).   Assessment: Heparin for new onset PE, heparin levels were low <0.1 previously and heparin drip rebolus given & rate increased to 2000 units/hr this AM.   RN later notes IV possibly infiltrated and started a new IV site with heparin infusing at new site ~ 10:30 this AM.   Heparin level = 0.42 on 2000 units/hr, This level is therapeutic.   Goal of Therapy:  Heparin level 0.3-0.7 units/ml Monitor platelets by anticoagulation protocol: Yes   Plan:  Continue IV Heparin drip to 2000 units/hr Daily HL, CBC.   Bonnita Nasuti Pharm.D. CPP, BCPS Clinical Pharmacist [phone] 05/24/2017 9:43 PM

## 2017-05-25 ENCOUNTER — Inpatient Hospital Stay (HOSPITAL_COMMUNITY): Payer: Medicaid Other

## 2017-05-25 DIAGNOSIS — R609 Edema, unspecified: Secondary | ICD-10-CM

## 2017-05-25 DIAGNOSIS — I5021 Acute systolic (congestive) heart failure: Secondary | ICD-10-CM

## 2017-05-25 DIAGNOSIS — I2699 Other pulmonary embolism without acute cor pulmonale: Secondary | ICD-10-CM

## 2017-05-25 LAB — CBC
HEMATOCRIT: 42.9 % (ref 36.0–46.0)
Hemoglobin: 13.6 g/dL (ref 12.0–15.0)
MCH: 27.3 pg (ref 26.0–34.0)
MCHC: 31.7 g/dL (ref 30.0–36.0)
MCV: 86 fL (ref 78.0–100.0)
PLATELETS: 604 10*3/uL — AB (ref 150–400)
RBC: 4.99 MIL/uL (ref 3.87–5.11)
RDW: 16 % — AB (ref 11.5–15.5)
WBC: 20.4 10*3/uL — AB (ref 4.0–10.5)

## 2017-05-25 LAB — COMPREHENSIVE METABOLIC PANEL
ALT: 22 U/L (ref 14–54)
AST: 40 U/L (ref 15–41)
Albumin: 2.3 g/dL — ABNORMAL LOW (ref 3.5–5.0)
Alkaline Phosphatase: 76 U/L (ref 38–126)
Anion gap: 15 (ref 5–15)
BILIRUBIN TOTAL: 1.6 mg/dL — AB (ref 0.3–1.2)
BUN: 13 mg/dL (ref 6–20)
CHLORIDE: 90 mmol/L — AB (ref 101–111)
CO2: 31 mmol/L (ref 22–32)
CREATININE: 0.87 mg/dL (ref 0.44–1.00)
Calcium: 8.6 mg/dL — ABNORMAL LOW (ref 8.9–10.3)
Glucose, Bld: 144 mg/dL — ABNORMAL HIGH (ref 65–99)
POTASSIUM: 3.4 mmol/L — AB (ref 3.5–5.1)
Sodium: 136 mmol/L (ref 135–145)
TOTAL PROTEIN: 7.3 g/dL (ref 6.5–8.1)

## 2017-05-25 LAB — HEPARIN LEVEL (UNFRACTIONATED)
HEPARIN UNFRACTIONATED: 0.84 [IU]/mL — AB (ref 0.30–0.70)
Heparin Unfractionated: 0.68 IU/mL (ref 0.30–0.70)

## 2017-05-25 LAB — HEMOGLOBIN A1C
HEMOGLOBIN A1C: 7.1 % — AB (ref 4.8–5.6)
MEAN PLASMA GLUCOSE: 157.07 mg/dL

## 2017-05-25 LAB — MAGNESIUM: Magnesium: 1.3 mg/dL — ABNORMAL LOW (ref 1.7–2.4)

## 2017-05-25 MED ORDER — SODIUM CHLORIDE 0.9 % IV SOLN
3.0000 g | Freq: Three times a day (TID) | INTRAVENOUS | Status: DC
  Administered 2017-05-25: 3 g via INTRAVENOUS
  Filled 2017-05-25 (×4): qty 3

## 2017-05-25 MED ORDER — METHOCARBAMOL 500 MG PO TABS
750.0000 mg | ORAL_TABLET | Freq: Four times a day (QID) | ORAL | Status: DC | PRN
  Administered 2017-05-25 – 2017-05-26 (×2): 750 mg via ORAL
  Filled 2017-05-25: qty 2

## 2017-05-25 MED ORDER — MAGNESIUM SULFATE 2 GM/50ML IV SOLN
2.0000 g | Freq: Two times a day (BID) | INTRAVENOUS | Status: DC
  Administered 2017-05-25 – 2017-05-30 (×12): 2 g via INTRAVENOUS
  Filled 2017-05-25 (×14): qty 50

## 2017-05-25 MED ORDER — LOSARTAN POTASSIUM 25 MG PO TABS
12.5000 mg | ORAL_TABLET | Freq: Two times a day (BID) | ORAL | Status: DC
  Administered 2017-05-25 – 2017-05-26 (×2): 12.5 mg via ORAL
  Filled 2017-05-25 (×3): qty 1

## 2017-05-25 MED ORDER — CYCLOBENZAPRINE HCL 10 MG PO TABS
10.0000 mg | ORAL_TABLET | Freq: Once | ORAL | Status: AC
  Administered 2017-05-25: 10 mg via ORAL
  Filled 2017-05-25: qty 1

## 2017-05-25 NOTE — Progress Notes (Signed)
VASCULAR LAB PRELIMINARY  PRELIMINARY  PRELIMINARY  PRELIMINARY  Bilateral upper extremity venous duplex completed.    Preliminary report:  There is acute, non occlusive DVT noted in the right internal jugular and subclavian veins.  All other veins appear to be thrombus free. There is interstitial fluid noted throughout the left upper extremity.   Lanyla Costello, RVT 05/25/2017, 9:33 AM

## 2017-05-25 NOTE — Progress Notes (Addendum)
Advanced Heart Failure Consult Note   HPI    51 y/o smoker with h/o GERD admitted with acute systolic HF. Advanced HF team asked to consult by Dr. Meda Coffee,   ~ 6 week  h/o fatigue and progressive SOB after stopping smoking. Initially felt to have bronchitis (had fever and chills)  but then developed progressive HF symptoms.  On admit BMP 2921, Echo EF 15-20% with biventricular failure. Also got d-dimer ~19. VQ with large bilateral defects concerning for PE. UE ultrasound with acute, non occlusive DVT noted in the right internal jugular and subclavian veins. Remains on heparin.   Has been diuresing well with lasix. Weight 220 -> 194. Breathing much better. No CP     Past Medical History:  Diagnosis Date  . GERD (gastroesophageal reflux disease)   . Seasonal allergies    Social History  Substance Use Topics  . Smoking status: Former Smoker    Packs/day: 1.00    Types: Cigarettes  . Smokeless tobacco: Never Used  . Alcohol use Yes    Family History  Problem Relation Age of Onset  . COPD Mother   . Rheum arthritis Mother   . Breast cancer Mother   . Hypertension Father   . Skin cancer Father   . Heart attack Father        age 6s  . Heart attack Paternal Grandfather   . Stroke Maternal Grandfather       Objective:   Weight Range:  Vital Signs:   Temp:  [98.6 F (37 C)-98.9 F (37.2 C)] 98.9 F (37.2 C) (08/18 0424) Pulse Rate:  [120-127] 120 (08/18 1201) Resp:  [20-22] 20 (08/18 0424) BP: (120-141)/(90-96) 129/90 (08/18 1201) SpO2:  [90 %-97 %] 94 % (08/18 1201) Weight:  [88.1 kg (194 lb 4.8 oz)] 88.1 kg (194 lb 4.8 oz) (08/18 0424) Last BM Date: 05/23/17  Weight change: Filed Weights   05/23/17 0630 05/24/17 0446 05/25/17 0424  Weight: 96.3 kg (212 lb 4.8 oz) 92.8 kg (204 lb 9.6 oz) 88.1 kg (194 lb 4.8 oz)    Intake/Output:   Intake/Output Summary (Last 24 hours) at 05/25/17 1511 Last data filed at 05/25/17 1500  Gross per 24 hour  Intake           1134.35 ml  Output             6501 ml  Net         -5366.65 ml     Physical Exam: General:  Sitting in bed. Hoarse. NAD  HEENT: normal Neck: supple. JVP hard to see. Looks elevated  . Carotids 2+ bilat; no bruits. No lymphadenopathy or thryomegaly appreciated. Cor: PMI laterally displaced. Tachy regular +s3 Lungs: clear Abdomen: obese soft, nontender, nondistended. No hepatosplenomegaly. No bruits or masses. Good bowel sounds. Extremities: no cyanosis, clubbing, rash, 2+ edema Neuro: alert & orientedx3, cranial nerves grossly intact. moves all 4 extremities w/o difficulty. Affect pleasant  Telemetry: Sinus tach 100-110 Personally reviewed   ECG: sinus tach 129 No ST-T wave abnormalities. Narrow QRS Personally reviewed  Labs: Basic Metabolic Panel:  Recent Labs Lab 05/21/17 2100 05/22/17 0203 05/23/17 0408 05/24/17 0457 05/25/17 0520  NA 134*  --  138 138 136  K 4.3  --  3.5 3.7 3.4*  CL 94*  --  93* 93* 90*  CO2 25  --  28 26 31   GLUCOSE 166*  --  121* 124* 144*  BUN 26*  --  19 19 13   CREATININE  1.23*  --  0.88 0.88 0.87  CALCIUM 8.6*  --  8.7* 8.2* 8.6*  MG  --  1.3* 1.5* 1.2* 1.3*    Liver Function Tests:  Recent Labs Lab 05/24/17 0457 05/25/17 0520  AST 47* 40  ALT 22 22  ALKPHOS 75 76  BILITOT 1.8* 1.6*  PROT 5.9* 7.3  ALBUMIN 2.0* 2.3*   No results for input(s): LIPASE, AMYLASE in the last 168 hours. No results for input(s): AMMONIA in the last 168 hours.  CBC:  Recent Labs Lab 05/21/17 2100 05/22/17 0508 05/23/17 0408 05/24/17 0457 05/25/17 0520  WBC 22.7* 23.6* 22.1* 18.1* 20.4*  NEUTROABS  --   --  16.4*  --   --   HGB 12.7 12.9 12.9 12.8 13.6  HCT 41.5 42.2 41.5 42.0 42.9  MCV 88.5 88.1 88.1 87.3 86.0  PLT 269 510* 478* 473* 604*    Cardiac Enzymes:  Recent Labs Lab 05/21/17 2100 05/22/17 0203 05/22/17 0508  TROPONINI 0.48* 0.51* 0.46*    BNP: BNP (last 3 results)  Recent Labs  05/21/17 2100  BNP 2,921.6*     ProBNP (last 3 results) No results for input(s): PROBNP in the last 8760 hours.    Other results:  Imaging: Dg Chest 2 View  Result Date: 05/23/2017 CLINICAL DATA:  Shortness of breath EXAM: CHEST  2 VIEW COMPARISON:  05/21/2017 FINDINGS: Small bilateral effusions.  Right lung grossly clear. Persistent consolidation in the left lower lobe with streaky opacity at the lingula. No significant change. Borderline cardiomegaly. No pneumothorax. IMPRESSION: 1. Small bilateral pleural effusions 2. No significant interval change in lingular and left lower lobe consolidation Electronically Signed   By: Donavan Foil M.D.   On: 05/23/2017 19:03   Nm Pulmonary Perf And Vent  Result Date: 05/23/2017 CLINICAL DATA:  Positive D-dimer suspect pulmonary embolus EXAM: NUCLEAR MEDICINE VENTILATION - PERFUSION LUNG SCAN TECHNIQUE: Ventilation images were obtained in multiple projections using inhaled aerosol Tc-24m DTPA. Perfusion images were obtained in multiple projections after intravenous injection of Tc-74m MAA. RADIOPHARMACEUTICALS:  30 mCi Technetium-13m DTPA aerosol inhalation and 4 mCi Technetium-8m MAA IV COMPARISON:  Chest radiograph 05/23/2017 FINDINGS: Ventilation: Large defect in the left lower lobe, corresponding to radiographic abnormality. Perfusion: At least 2 large peripheral defects in the apical and lateral aspect of the right lung with relatively normal perfusion and no corresponding opacity on radiograph. Large matched defect in the left lower lobe as well. IMPRESSION: Findings are consistent with high probability for pulmonary embolus. Critical Value/emergent results were called by telephone at the time of interpretation on 05/23/2017 at 7:14 pm to Dr. Lovey Newcomer, who verbally acknowledged these results. Electronically Signed   By: Donavan Foil M.D.   On: 05/23/2017 19:14      Medications:     Scheduled Medications: . amoxicillin-clavulanate  1 tablet Oral Q12H  . digoxin  0.125  mg Oral Daily  . furosemide  80 mg Intravenous Q8H  . omeprazole  20 mg Oral Daily  . pneumococcal 23 valent vaccine  0.5 mL Intramuscular Tomorrow-1000  . potassium chloride  20 mEq Oral BID  . spironolactone  25 mg Oral Daily     Infusions: . heparin 1,900 Units/hr (05/25/17 1100)  . magnesium sulfate 1 - 4 g bolus IVPB Stopped (05/25/17 1119)     PRN Medications:  acetaminophen **OR** acetaminophen, bisacodyl, gi cocktail, LORazepam, ondansetron **OR** ondansetron (ZOFRAN) IV, senna-docusate   Assessment:   51 y/o smoke admitted with acute systolic HF with biventricular  dysfunction EF 15-20%. Also note to have markedly positive VQ suggestive of PE.   Plan/Discussion:     1. Acute systolic HF with biventricular dysfunction - EF 15-20% by echo with severe RV dysfunction - remains very tenuous with persistent volume overload and prominent sinus tach - continue digoxin and spiro. Increase losartan. No b-blocker yet - continue diuresis - place TED hose - will need R/L heart cath next week   2. Acute RIJ/sublcavian DVT with bilateral PE  - d-dimer 19.   - VQ with bilateral perfusion defects  - continue heparin - mother had multiple clots as well. Consider hypercoag w/u - agree with chest CT per Dr. Verlon Au  3. Tobacco use -now quit  4. NSVT - keep K> 4.0 Mg > 2.0  Length of Stay: 4   Bensimhon, Daniel MD 05/25/2017, 3:11 PM  Advanced Heart Failure Team Pager [phone] (M-F; [place])  Please contact [facility] for night-coverage after hours (4p -7a ) and weekends on [email or web address]

## 2017-05-25 NOTE — Progress Notes (Signed)
ANTICOAGULATION CONSULT NOTE - Follow Up Consult  Pharmacy Consult for Heparin  Indication: pulmonary embolus  No Known Allergies  Patient Measurements: Height: 5\' 8"  (172.7 cm) Weight: 194 lb 4.8 oz (88.1 kg) IBW/kg (Calculated) : 63.9 kg Heparin Dosing Weight: 85 kg  Vital Signs: Temp: 98.9 F (37.2 C) (08/18 0424) Temp Source: Oral (08/18 0424) BP: 141/96 (08/18 0424) Pulse Rate: 121 (08/18 1017)  Labs:  Recent Labs  05/23/17 0408  05/24/17 0457 05/24/17 1345 05/24/17 1938 05/25/17 0520  HGB 12.9  --  12.8  --   --  13.6  HCT 41.5  --  42.0  --   --  42.9  PLT 478*  --  473*  --   --  604*  LABPROT 16.6*  --   --   --   --   --   INR 1.33  --   --   --   --   --   HEPARINUNFRC  --   < > <0.10* 0.47 0.42 0.68  CREATININE 0.88  --  0.88  --   --  0.87  < > = values in this interval not displayed.  Estimated Creatinine Clearance: 87.9 mL/min (by C-G formula based on SCr of 0.87 mg/dL).  Assessment: Heparin for new onset PE, heparin levels were low <0.1 previously and heparin drip rebolus given & rate increased to 2000 units/hr this AM.   RN later notes IV possibly infiltrated and started a new IV site with heparin infusing at new site ~ 10:30 this AM.   Heparin level = 0.68 on 2000 units/hr; will reduce infusion rate to prevent level from going above goal. RN reports no issues with new access.  Goal of Therapy:  Heparin level 0.3-0.7 units/ml Monitor platelets by anticoagulation protocol: Yes   Plan:  Reduce heparin gtt to 1900 units/hr  Daily heparin level and CBC  Monitor for s/sx of bleeding  Georga Bora, PharmD Clinical Pharmacist 05/25/2017 10:47 AM

## 2017-05-25 NOTE — Progress Notes (Signed)
Pt is alert and oriented in bed up to bathroom frequently. With no distress. Swelling in left upper extremity elevated and plan to have doppler study. Vitals stable

## 2017-05-25 NOTE — Progress Notes (Signed)
VASCULAR LAB PRELIMINARY  PRELIMINARY  PRELIMINARY  PRELIMINARY  Bilateral lower extremity venous duplex completed.    Preliminary report:  There is no DVT or SVT noted in the bilateral lower extremities.  There is significant interstitial fluid noted throughout the bilateral calves.  Casie Sturgeon, RVT 05/25/2017, 9:32 AM

## 2017-05-25 NOTE — Progress Notes (Signed)
ANTICOAGULATION CONSULT NOTE - Follow Up Consult  Pharmacy Consult for Heparin  Indication: pulmonary embolus  No Known Allergies  Patient Measurements: Height: 5\' 8"  (172.7 cm) Weight: 194 lb 4.8 oz (88.1 kg) IBW/kg (Calculated) : 63.9 kg Heparin Dosing Weight: 85 kg  Vital Signs: BP: 111/85 (08/18 1807) Pulse Rate: 123 (08/18 1807)  Labs:  Recent Labs  05/23/17 0408  05/24/17 0457  05/24/17 1938 05/25/17 0520 05/25/17 1907  HGB 12.9  --  12.8  --   --  13.6  --   HCT 41.5  --  42.0  --   --  42.9  --   PLT 478*  --  473*  --   --  604*  --   LABPROT 16.6*  --   --   --   --   --   --   INR 1.33  --   --   --   --   --   --   HEPARINUNFRC  --   < > <0.10*  < > 0.42 0.68 0.84*  CREATININE 0.88  --  0.88  --   --  0.87  --   < > = values in this interval not displayed.  Estimated Creatinine Clearance: 87.9 mL/min (by C-G formula based on SCr of 0.87 mg/dL).  Assessment: Heparin for new onset PE, heparin levels were low <0.1 previously and heparin drip rebolus given & rate increased to 2000 units/hr this AM.   RN later notes IV possibly infiltrated and started a new IV site with heparin infusing at new site ~ 10:30 this AM.   Heparin level supratherapeutic= 0.84 on 1900 units/hr. RN reports no bleeding or infusion issues.  Goal of Therapy:  Heparin level 0.3-0.7 units/ml Monitor platelets by anticoagulation protocol: Yes   Plan:  Reduce heparin gtt to 1750 units/hr  Daily heparin level and CBC  Monitor for s/sx of bleeding  Georga Bora, PharmD Clinical Pharmacist 05/25/2017 7:55 PM

## 2017-05-25 NOTE — Progress Notes (Signed)
Having some back spasm and discomfort since this pm 16:00 VSS but ambulated and hypoxic--needing 2 liters again BP 111/85 (BP Location: Left Arm)   Pulse (!) 123   Temp 98.9 F (37.2 C) (Oral)   Resp 20   Ht 5\' 8"  (1.727 m)   Wt 88.1 kg (194 lb 4.8 oz)   SpO2 96%   BMI 29.54 kg/m  O/e winded, slight tired, some spasm l paravert msucles No cp Some sputum earlier  Her monitors and ekg show sinus tach with no st-t changes I do not think this is ACS currently Troponin would be ?  Regardless with a CM and EF 15 and compensatory tachycardia  Will ask [facility] NP to review later if further issues IF any changes, get stat ekg, cxr and troponin Will change Augmentin to usnasyn for now  Verneita Griffes, MD [facility] [phone]

## 2017-05-25 NOTE — Progress Notes (Signed)
Patient stable and knowledgeable about plan of care, new RN will be taking over care for the night.

## 2017-05-25 NOTE — Progress Notes (Signed)
PROGRESS NOTE    Kylie Bennett  VOH:[id number] DOB: 05-07-1966 DOA: 05/21/2017 PCP: Patient, No Pcp Per  Outpatient Specialists:     Brief Narrative:   51 year old female with sparse medical history Recent smoker until 04/2017 Note that patient declined labs or chest x-ray at that visit Hypertension  For the past 5 weeks patient has been feeling poorly since quitting smoking She developed bronchitic-like symptoms Recently seen at no vomiting treated for bronchitis? Antibiotics-Augmentin Has had multiple short trips out of town, none specifically > 4 hours duration             Documentation notes lower leg edema shortness of breath with activity and yellow-green sputum   Assessment & Plan:   Principal Problem:   Acute CHF (congestive heart failure) ([place]) Active Problems:   Hyponatremia   Community acquired pneumonia of right lower lobe of lung ([place])   Acute systolic + diastolic CHF Ef 69%--? NICM of unknown etiology Grade 3 Diastolic parameter  Lasix 40 ii/day-->80ii/day--->80iii/day 8/18  Aldactone 25 started 8/15  Digoxin 0.125 started 8/15--- continue same dose  Cardiac cath planned for 8/20?  I/o so far = -8.7--weight 212-->204    Appreciate cardiology input  D Dimer 18 Recent long travels, smoker-not on estrogen replacement + PE on V/Q scan  ? Underlying malignancy   Continue  IV heparin for pulmonary embolism   Right upper extremity subclavian and internal jugular DVT concerning for possible malignancy  Has relatives who have had breast cancer, father had colon cancer skin cancer and lung cancer  Patient herself has never had screening colonoscopy nor mammogram  Discussed contrast use with cardiology-obtaining CT scan chest to start              Community-acquired pneumonia  Still afebrile             Augmentin and continued-procalcitonin 0.186 can safely continue by mouth has dropped to 0.17   White count 20, toxic granulations earlier as needed  Mild  hyponatremia-probably related to CHF 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              Needs outpatient counseling regarding the same  might benefit from bariatric referral versus structured weight loss program  Defer aggressive management of DM today--will let patient think about med options--wouldn't start metfromin for now with liekly contrast load and prefer not to poke her qid for needle sticks  Discussed with fiance at bedside We'll be here over the weekend diuresis and will probably need cardiac including potential other workup 8/20   Consultants:   Cardiology  Procedures:   VQ scan8/16 high probability PE   Cardiac cath is pending  Antimicrobials:   Augmentin    Subjective:  Desats to high 80s but has improved Lower extremity swelling is slightly better A little bit anxious and somewhat frustrated No chest pain No nausea nor vomiting but is requesting omeprazole >pantoprazole as this is her home medication No sputum No diarrhea nor dark stool   Objective: Vitals:  05/24/17 0446 05/24/17 1356 05/24/17 2150 05/25/17 0424  BP: (!) 141/98 124/82 (!) 120/92 (!) 141/96  Pulse: (!) 103 (!) 121 (!) 126 (!) 127  Resp: 20 (!) 21 (!) 22 20  Temp: 99.1 F (37.3 C) 98 F (36.7 C) 98.6 F (37 C) 98.9 F (37.2 C)  TempSrc: Oral Oral Oral Oral  SpO2: 96% 96% 90% 97%  Weight: 92.8 kg (204 lb 9.6 oz)   88.1 kg (194 lb 4.8 oz)  Height:        Intake/Output Summary (Last 24 hours) at 05/25/17 0823 Last data filed at 05/25/17 0415  Gross per 24 hour  Intake           1418.35 ml  Output             6641 ml  Net         -5222.65 ml   Filed Weights   05/23/17 0630 05/24/17 0446 05/25/17 0424  Weight: 96.3 kg (212 lb 4.8 oz) 92.8 kg (204 lb 9.6 oz) 88.1 kg (194 lb 4.8 oz)    Examination:    Data Reviewed: I have personally reviewed following labs and imaging studies  CBC:  Recent Labs Lab 05/21/17 2100 05/22/17 0508 05/23/17 0408 05/24/17 0457 05/25/17 0520  WBC 22.7* 23.6* 22.1* 18.1* 20.4*  NEUTROABS  --   --  16.4*  --   --   HGB 12.7 12.9 12.9 12.8 13.6  HCT 41.5 42.2 41.5 42.0 42.9  MCV 88.5 88.1 88.1 87.3 86.0  PLT 269 510* 478* 473* 562*   Basic Metabolic Panel:  Recent Labs Lab 05/21/17 2100 05/22/17 0203 05/23/17 0408 05/24/17 0457 05/25/17 0520  NA 134*  --  138 138 136  K 4.3  --  3.5 3.7 3.4*  CL 94*  --  93* 93* 90*  CO2 25  --  28 26 31   GLUCOSE 166*  --  121* 124* 144*  BUN 26*  --  19 19 13   CREATININE 1.23*  --  0.88 0.88 0.87  CALCIUM 8.6*  --  8.7* 8.2* 8.6*  MG  --  1.3* 1.5* 1.2* 1.3*   GFR: Estimated Creatinine Clearance: 87.9 mL/min (by C-G formula based on SCr of 0.87 mg/dL). Liver Function Tests:  Recent Labs Lab 05/24/17 0457 05/25/17 0520  AST 47* 40  ALT 22 22  ALKPHOS 75 76  BILITOT 1.8* 1.6*  PROT 5.9* 7.3  ALBUMIN 2.0* 2.3*   No results for input(s): LIPASE, AMYLASE in the last 168 hours. No results for input(s): AMMONIA in the last 168 hours. Coagulation Profile:  Recent Labs Lab 05/23/17 0408  INR 1.33   Cardiac Enzymes:  Recent Labs Lab 05/21/17 2100 05/22/17 0203 05/22/17 0508  TROPONINI 0.48* 0.51* 0.46*   BNP (last 3 results) No results for input(s): PROBNP in the last 8760 hours. HbA1C:  Recent Labs  05/25/17 0520  HGBA1C 7.1*   CBG: No results for input(s): GLUCAP in the last 168 hours. Lipid Profile: No results for input(s): CHOL, HDL, LDLCALC, TRIG, CHOLHDL, LDLDIRECT in the last 72 hours. Thyroid Function Tests: No results for input(s): TSH,  T4TOTAL, FREET4, T3FREE, THYROIDAB in the last 72 hours. Anemia Panel: No results for input(s): VITAMINB12, FOLATE, FERRITIN, TIBC, IRON, RETICCTPCT in the last 72 hours. Urine analysis:    Component Value Date/Time   COLORURINE YELLOW 05/21/2017 2320   APPEARANCEUR CLOUDY (A) 05/21/2017 2320   LABSPEC 1.010 05/21/2017 2320   PHURINE 5.5 05/21/2017 2320   GLUCOSEU NEGATIVE  05/21/2017 2320   HGBUR SMALL (A) 05/21/2017 2320   BILIRUBINUR NEGATIVE 05/21/2017 2320   BILIRUBINUR moderate 10/20/2014 1654   KETONESUR NEGATIVE 05/21/2017 2320   PROTEINUR 30 (A) 05/21/2017 2320   UROBILINOGEN 2.0 10/20/2014 1654   NITRITE NEGATIVE 05/21/2017 2320   LEUKOCYTESUR SMALL (A) 05/21/2017 2320   Sepsis Labs: @LABRCNTIP (procalcitonin:4,lacticidven:4)  )No results found for this or any previous visit (from the past 240 hour(s)).       Radiology Studies: Reviewed personally by MD  Scheduled Meds: . amoxicillin-clavulanate  1 tablet Oral Q12H  . digoxin  0.125 mg Oral Daily  . furosemide  80 mg Intravenous Q8H  . magnesium oxide  400 mg Oral BID  . omeprazole  20 mg Oral Daily  . pneumococcal 23 valent vaccine  0.5 mL Intramuscular Tomorrow-1000  . potassium chloride  20 mEq Oral BID  . spironolactone  25 mg Oral Daily   Continuous Infusions: . heparin 2,000 Units/hr (05/25/17 0546)     LOS: 4 days    Time spent: [place], MD [facility]   If 7PM-7AM, please contact night-coverage [email or web address] Password [facility] 05/25/2017, 8:23 AM

## 2017-05-26 ENCOUNTER — Inpatient Hospital Stay (HOSPITAL_COMMUNITY): Payer: Medicaid Other

## 2017-05-26 ENCOUNTER — Encounter (HOSPITAL_COMMUNITY): Payer: Self-pay

## 2017-05-26 LAB — CBC
HEMATOCRIT: 39.4 % (ref 36.0–46.0)
HEMOGLOBIN: 12.4 g/dL (ref 12.0–15.0)
MCH: 26.8 pg (ref 26.0–34.0)
MCHC: 31.5 g/dL (ref 30.0–36.0)
MCV: 85.3 fL (ref 78.0–100.0)
Platelets: 570 10*3/uL — ABNORMAL HIGH (ref 150–400)
RBC: 4.62 MIL/uL (ref 3.87–5.11)
RDW: 16.2 % — AB (ref 11.5–15.5)
WBC: 25.3 10*3/uL — ABNORMAL HIGH (ref 4.0–10.5)

## 2017-05-26 LAB — BASIC METABOLIC PANEL
ANION GAP: 13 (ref 5–15)
BUN: 20 mg/dL (ref 6–20)
CO2: 30 mmol/L (ref 22–32)
Calcium: 8.2 mg/dL — ABNORMAL LOW (ref 8.9–10.3)
Chloride: 91 mmol/L — ABNORMAL LOW (ref 101–111)
Creatinine, Ser: 0.87 mg/dL (ref 0.44–1.00)
GFR calc Af Amer: >60 mL/min (ref >60)
GFR calc non Af Amer: >60 mL/min (ref >60)
GLUCOSE: 191 mg/dL — AB (ref 65–99)
Potassium: 3.4 mmol/L — ABNORMAL LOW (ref 3.5–5.1)
Sodium: 134 mmol/L — ABNORMAL LOW (ref 135–145)

## 2017-05-26 LAB — HEPARIN LEVEL (UNFRACTIONATED)
HEPARIN UNFRACTIONATED: 0.56 [IU]/mL (ref 0.30–0.70)
Heparin Unfractionated: 0.69 IU/mL (ref 0.30–0.70)

## 2017-05-26 LAB — PROCALCITONIN: Procalcitonin: 0.38 ng/mL

## 2017-05-26 LAB — MAGNESIUM: Magnesium: 2 mg/dL (ref 1.7–2.4)

## 2017-05-26 MED ORDER — SODIUM CHLORIDE 0.9 % IV SOLN
1.0000 g | Freq: Three times a day (TID) | INTRAVENOUS | Status: AC
  Administered 2017-05-26 – 2017-05-31 (×15): 1 g via INTRAVENOUS
  Filled 2017-05-26 (×15): qty 1

## 2017-05-26 MED ORDER — SODIUM CHLORIDE 0.9 % IV SOLN
INTRAVENOUS | Status: DC
  Administered 2017-05-27: 01:00:00 via INTRAVENOUS

## 2017-05-26 MED ORDER — GUAIFENESIN ER 600 MG PO TB12
600.0000 mg | ORAL_TABLET | Freq: Two times a day (BID) | ORAL | Status: DC
  Administered 2017-05-26 – 2017-06-01 (×13): 600 mg via ORAL
  Filled 2017-05-26 (×13): qty 1

## 2017-05-26 MED ORDER — ASPIRIN 81 MG PO CHEW
81.0000 mg | CHEWABLE_TABLET | ORAL | Status: AC
  Administered 2017-05-27: 81 mg via ORAL
  Filled 2017-05-26: qty 1

## 2017-05-26 MED ORDER — SODIUM CHLORIDE 0.9% FLUSH: 3.0000 mL | INTRAVENOUS | Status: DC | PRN

## 2017-05-26 MED ORDER — CYCLOBENZAPRINE HCL 5 MG PO TABS
7.5000 mg | ORAL_TABLET | Freq: Three times a day (TID) | ORAL | Status: DC
  Administered 2017-05-26 – 2017-06-01 (×15): 7.5 mg via ORAL
  Filled 2017-05-26 (×21): qty 2

## 2017-05-26 MED ORDER — POTASSIUM CHLORIDE CRYS ER 20 MEQ PO TBCR
40.0000 meq | EXTENDED_RELEASE_TABLET | Freq: Once | ORAL | Status: AC
  Administered 2017-05-26: 40 meq via ORAL
  Filled 2017-05-26: qty 2

## 2017-05-26 MED ORDER — SODIUM CHLORIDE 0.9% FLUSH
3.0000 mL | Freq: Two times a day (BID) | INTRAVENOUS | Status: DC
  Administered 2017-05-27: 3 mL via INTRAVENOUS

## 2017-05-26 MED ORDER — FUROSEMIDE 10 MG/ML IJ SOLN: 80.0000 mg | Freq: Two times a day (BID) | INTRAMUSCULAR | Status: DC

## 2017-05-26 MED ORDER — LOSARTAN POTASSIUM 25 MG PO TABS
25.0000 mg | ORAL_TABLET | Freq: Two times a day (BID) | ORAL | Status: DC
  Administered 2017-05-26 – 2017-05-27 (×3): 25 mg via ORAL
  Filled 2017-05-26 (×3): qty 1

## 2017-05-26 MED ORDER — SODIUM CHLORIDE 0.9 % IV SOLN
1.0000 g | Freq: Three times a day (TID) | INTRAVENOUS | Status: DC
  Administered 2017-05-26: 1 g via INTRAVENOUS
  Filled 2017-05-26 (×2): qty 1

## 2017-05-26 MED ORDER — SODIUM CHLORIDE 0.9 % IV SOLN: 1000.0000 mg | Freq: Three times a day (TID) | INTRAVENOUS | Status: DC

## 2017-05-26 MED ORDER — SODIUM CHLORIDE 0.9 % IV SOLN: 250.0000 mL | INTRAVENOUS | Status: DC | PRN

## 2017-05-26 MED ORDER — IOPAMIDOL (ISOVUE-300) INJECTION 61%
INTRAVENOUS | Status: AC
  Administered 2017-05-26: 75 mL
  Filled 2017-05-26: qty 75

## 2017-05-26 NOTE — Progress Notes (Signed)
PROGRESS NOTE    Kylie Bennett  TKW:[id number] DOB: January 22, 1966 DOA: 05/21/2017 PCP: Patient, No Pcp Per  Outpatient Specialists:     Brief Narrative:   51 year old female with sparse medical history Recent smoker until 04/2017 Note that patient declined labs or chest x-ray at that visit Hypertension  For the past 5 weeks patient has been feeling poorly since quitting smoking She developed bronchitic-like symptoms Recently seen at no vomiting treated for bronchitis? Antibiotics-Augmentin Has had multiple short trips out of town, none specifically > 4 hours duration             Documentation notes lower leg edema shortness of breath with activity and yellow-green sputum   Assessment & Plan:   Principal Problem:   Acute CHF (congestive heart failure) ([place]) Active Problems:   Hyponatremia   Community acquired pneumonia of right lower lobe of lung ([place])   Acute systolic + diastolic CHF Ef 92%--? NICM of unknown etiology Grade 3 Diastolic parameter  Lasix 40 ii/day-->80ii/day--->80iii/day-further adj per cardiology  Aldactone 25 started 8/15  Digoxin 0.125 started 8/15--- continue same dose  Creat starting to creep upwards 8.19  I/o so far = - 20 liters so far--weight 212-->204-->187  ?Cardiac cath per Adv HF team   D Dimer 19 Recent long travels, smoker-not on estrogen replacement + PE on V/Q scan  CTA 8/19 as below ? 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              Needs outpatient counseling regarding the same  might benefit from bariatric referral versus structured weight loss program  Defer aggressive management of DM for right now--If sugars abive 200 initate SSI   Consultants:   Cardiology  Procedures:   VQ scan8/16 high probability PE   Cardiac cath is pending  Antimicrobials:   Augmentin    Subjective:  Spasm in back better with flexeril slept 3 hours today so far and rested Sob improved Still swollen No cp some cough, some rales and sputum   Objective: Vitals:   05/25/17 1807 05/25/17 2102 05/26/17 0647 05/26/17 1040  BP: 111/85 111/86 117/80 121/79  Pulse: (!) 123 (!) 124 (!) 109 (!) 110  Resp:  18 18   Temp:  98.6 F (37 C) 98.4 F (36.9 C)   TempSrc:  Oral Oral   SpO2: 96% 91% 94% 91%  Weight:   85.2 kg (187 lb 14.4 oz)   Height:        Intake/Output Summary (Last 24 hours) at 05/26/17 1239 Last data filed at 05/26/17 1058  Gross per 24 hour  Intake              366 ml  Output  4450 ml  Net            -4084 ml   Filed Weights   05/24/17 0446 05/25/17 0424 05/26/17 0647  Weight: 92.8 kg (204 lb 9.6 oz) 88.1 kg (194 lb 4.8 oz) 85.2 kg (187 lb 14.4 oz)    Examination:  Tired looking but overall less c-r distress than yesterday Rales on R post lung fields,  s1  s2 tachy-sinus tach JVD ?  With hepatojug reflex, no LAN, no thyromegally  grade 3 Le edema still No rash ROM intact cn grossly iontact   Data Reviewed: I have personally reviewed following labs and imaging studies  CBC:  Recent Labs Lab 05/22/17 0508 05/23/17 0408 05/24/17 0457 05/25/17 0520 05/26/17 0408  WBC 23.6* 22.1* 18.1* 20.4* 25.3*  NEUTROABS  --   16.4*  --   --   --   HGB 12.9 12.9 12.8 13.6 12.4  HCT 42.2 41.5 42.0 42.9 39.4  MCV 88.1 88.1 87.3 86.0 85.3  PLT 510* 478* 473* 604* 275*   Basic Metabolic Panel:  Recent Labs Lab 05/21/17 2100 05/22/17 0203 05/23/17 0408 05/24/17 0457 05/25/17 0520 05/26/17 0408  NA 134*  --  138 138 136 134*  K 4.3  --  3.5 3.7 3.4* 3.4*  CL 94*  --  93* 93* 90* 91*  CO2 25  --  28 26 31 30   GLUCOSE 166*  --  121* 124* 144* 191*  BUN 26*  --  19 19 13 20   CREATININE 1.23*  --  0.88 0.88 0.87 0.87  CALCIUM 8.6*  --  8.7* 8.2* 8.6* 8.2*  MG  --  1.3* 1.5* 1.2* 1.3* 2.0   GFR: Estimated Creatinine Clearance: 86.5 mL/min (by C-G formula based on SCr of 0.87 mg/dL). Liver Function Tests:  Recent Labs Lab 05/24/17 0457 05/25/17 0520  AST 47* 40  ALT 22 22  ALKPHOS 75 76  BILITOT 1.8* 1.6*  PROT 5.9* 7.3  ALBUMIN 2.0* 2.3*   No results for input(s): LIPASE, AMYLASE in the last 168 hours. No results for input(s): AMMONIA in the last 168 hours. Coagulation Profile:  Recent Labs Lab 05/23/17 0408  INR 1.33   Cardiac Enzymes:  Recent Labs Lab 05/21/17 2100 05/22/17 0203 05/22/17 0508  TROPONINI 0.48* 0.51* 0.46*   BNP (last 3 results) No results for input(s): PROBNP in the last 8760 hours. HbA1C:  Recent Labs  05/25/17 0520  HGBA1C 7.1*   CBG: No results for input(s): GLUCAP in the last 168 hours. Lipid Profile: No results for input(s): CHOL, HDL, LDLCALC, TRIG, CHOLHDL, LDLDIRECT in the last 72 hours. Thyroid Function Tests: No results for input(s): TSH, T4TOTAL, FREET4, T3FREE, THYROIDAB in the last 72 hours. Anemia Panel: No results for input(s): VITAMINB12, FOLATE, FERRITIN, TIBC, IRON, RETICCTPCT in the last 72 hours. Urine analysis:    Component Value Date/Time   COLORURINE YELLOW 05/21/2017 2320   APPEARANCEUR CLOUDY (A) 05/21/2017 2320   LABSPEC 1.010 05/21/2017 2320   PHURINE 5.5 05/21/2017 2320   GLUCOSEU NEGATIVE 05/21/2017 2320   HGBUR SMALL  (A) 05/21/2017 2320   BILIRUBINUR NEGATIVE 05/21/2017 2320   BILIRUBINUR moderate 10/20/2014 1654   KETONESUR NEGATIVE 05/21/2017 2320   PROTEINUR 30 (A) 05/21/2017 2320   UROBILINOGEN 2.0 10/20/2014 1654   NITRITE NEGATIVE 05/21/2017 2320   LEUKOCYTESUR SMALL (A) 05/21/2017 2320   Sepsis Labs: @LABRCNTIP (procalcitonin:4,lacticidven:4)  )No results found for this or any previous visit (from the past 240 hour(s)).  Radiology Studies: Reviewed personally by MD  Scheduled Meds: . cyclobenzaprine  7.5 mg Oral TID  . digoxin  0.125 mg Oral Daily  . furosemide  80 mg Intravenous Q8H  . losartan  12.5 mg Oral BID  . omeprazole  20 mg Oral Daily  . pneumococcal 23 valent vaccine  0.5 mL Intramuscular Tomorrow-1000  . potassium chloride  20 mEq Oral BID  . spironolactone  25 mg Oral Daily   Continuous Infusions: . heparin 1,750 Units/hr (05/25/17 2017)  . magnesium sulfate 1 - 4 g bolus IVPB 2 g (05/26/17 1053)  . meropenem (MERREM) IV       LOS: 5 days    Time spent: [place], MD [facility]   If 7PM-7AM, please contact night-coverage [email or web address] Password [facility] 05/26/2017, 12:39 PM

## 2017-05-26 NOTE — Progress Notes (Addendum)
Advanced Heart Failure Rounding Note   Subjective:    Remains somewhat dyspneic but improved. Remains on IV lasix. Out 5L. Weight down another 7 pounds.   No bleeding on heparin. Low back pain improved with flexeril.   Underwent CT chest this am (reviewed personally):  IMPRESSION: 1. Bilateral pulmonary emboli in the lower lobes and perhaps the right upper lobe. No heart strain identified. 2. Bibasilar pulmonary opacities. These opacities could represent pneumonia. Aspiration is possible. Pulmonary infarcts cannot be excluded given the pulmonary emboli. Recommend follow-up to resolution. 3. Coronary artery calcifications. 4. Mildly enlarged nodes and increased attenuation in the fat of the left axilla. This is a nonspecific finding. Recommend clinical correlation and short-term follow-up to resolution. 5. Shotty nodes in the mediastinum may be reactive given the findings in the lungs. 6. Small pleural effusions.  Small pericardial effusion.   Objective:   Weight Range:  Vital Signs:   Temp:  [98.4 F (36.9 C)-98.6 F (37 C)] 98.4 F (36.9 C) (08/19 0647) Pulse Rate:  [109-124] 110 (08/19 1040) Resp:  [18] 18 (08/19 0647) BP: (111-121)/(79-86) 121/79 (08/19 1040) SpO2:  [91 %-96 %] 91 % (08/19 1040) Weight:  [85.2 kg (187 lb 14.4 oz)] 85.2 kg (187 lb 14.4 oz) (08/19 0647) Last BM Date: 05/25/17  Weight change: Filed Weights   05/24/17 0446 05/25/17 0424 05/26/17 0647  Weight: 92.8 kg (204 lb 9.6 oz) 88.1 kg (194 lb 4.8 oz) 85.2 kg (187 lb 14.4 oz)    Intake/Output:   Intake/Output Summary (Last 24 hours) at 05/26/17 1358 Last data filed at 05/26/17 1058  Gross per 24 hour  Intake              366 ml  Output             4450 ml  Net            -4084 ml     Physical Exam: General: lying in bed. No resp difficulty. hoarse HEENT: normal Neck: supple. JVP 9-10 . Carotids 2+ bilat; no bruits. No lymphadenopathy or thryomegaly appreciated. Cor: PMI  laterally displaced. Tachy regular. +s3 Lungs: crackles at bases.  Abdomen: obese soft, nontender, nondistended. No hepatosplenomegaly. No bruits or masses. Good bowel sounds. Extremities: no cyanosis, clubbing, rash, 2+ edema Neuro: alert & orientedx3, cranial nerves grossly intact. moves all 4 extremities w/o difficulty. Affect pleasant  Telemetry: sinus tach 100-115 brief NSVT overnight Personally reviewed   Labs: Basic Metabolic Panel:  Recent Labs Lab 05/21/17 2100 05/22/17 0203 05/23/17 0408 05/24/17 0457 05/25/17 0520 05/26/17 0408  NA 134*  --  138 138 136 134*  K 4.3  --  3.5 3.7 3.4* 3.4*  CL 94*  --  93* 93* 90* 91*  CO2 25  --  28 26 31 30   GLUCOSE 166*  --  121* 124* 144* 191*  BUN 26*  --  19 19 13 20   CREATININE 1.23*  --  0.88 0.88 0.87 0.87  CALCIUM 8.6*  --  8.7* 8.2* 8.6* 8.2*  MG  --  1.3* 1.5* 1.2* 1.3* 2.0    Liver Function Tests:  Recent Labs Lab 05/24/17 0457 05/25/17 0520  AST 47* 40  ALT 22 22  ALKPHOS 75 76  BILITOT 1.8* 1.6*  PROT 5.9* 7.3  ALBUMIN 2.0* 2.3*   No results for input(s): LIPASE, AMYLASE in the last 168 hours. No results for input(s): AMMONIA in the last 168 hours.  CBC:  Recent Labs Lab 05/22/17  1962 05/23/17 0408 05/24/17 0457 05/25/17 0520 05/26/17 0408  WBC 23.6* 22.1* 18.1* 20.4* 25.3*  NEUTROABS  --  16.4*  --   --   --   HGB 12.9 12.9 12.8 13.6 12.4  HCT 42.2 41.5 42.0 42.9 39.4  MCV 88.1 88.1 87.3 86.0 85.3  PLT 510* 478* 473* 604* 570*    Cardiac Enzymes:  Recent Labs Lab 05/21/17 2100 05/22/17 0203 05/22/17 0508  TROPONINI 0.48* 0.51* 0.46*    BNP: BNP (last 3 results)  Recent Labs  05/21/17 2100  BNP 2,921.6*    ProBNP (last 3 results) No results for input(s): PROBNP in the last 8760 hours.    Other results:  Imaging: Ct Chest W Contrast  Addendum Date: 05/26/2017   ADDENDUM REPORT: 05/26/2017 10:25 ADDENDUM: The findings were called to Dr. Verlon Au. The patient had an  ultrasound yesterday which is not available for my review. After discussing with the patient's referring physician, I suspect DVT in the right jugular vein which is expanded and unopacified. I also suspect the increased attenuation of fat in the left axilla, which in retrospect extends along the left subclavian vein, is likely secondary to a left subclavian DVT. This could also explain the mild adenopathy in the left axilla. No anatomic causes for the DVTs is identified. Electronically Signed   By: Dorise Bullion III M.D   On: 05/26/2017 10:25   Result Date: 05/26/2017 CLINICAL DATA:  Concern for pneumonia. EXAM: CT CHEST WITH CONTRAST TECHNIQUE: Multidetector CT imaging of the chest was performed during intravenous contrast administration. CONTRAST:  32mL ISOVUE-300 IOPAMIDOL (ISOVUE-300) INJECTION 61% COMPARISON:  Multiple chest x-rays since May 21, 2017 FINDINGS: Cardiovascular: Coronary artery calcifications are identified. Cardiomegaly. The thoracic aorta is unremarkable. This is not a study tailored to evaluate for pulmonary emboli. However, there are emboli located in the lower lobe pulmonary arteries. Evaluation of the proximal right upper lobe pulmonary artery is not well assessed due to streak artifact off the SVC but an embolus is not excluded. No emboli in the main pulmonary arteries. The left ventricle demonstrates a diameter of 7.3 cm and the right ventricle demonstrates a diameter of 3.9 cm, with a ratio of well less than 0.9. No evidence of heart strain. Mediastinum/Nodes: Prominent nodes are seen in the left axilla, asymmetric to the right with a E representative node on series 5, image 41 measuring 12 mm. There is mild increased attenuation of fat of the left axilla is while. Shotty nodes are seen in the mediastinum without overt adenopathy. Tiny effusions are seen in the pleural spaces. A small pericardial effusion is noted posteriorly. The esophagus is normal. The thyroid is unremarkable.  Lungs/Pleura: The central airways are normal. No pneumothorax. Bibasilar infiltrates are noted in in medial aspect of the right lower lobe and more diffusely in the left lower lobe. No other abnormalities are seen within the lungs. Upper Abdomen: Cholelithiasis. No other abnormalities in the upper abdomen. Musculoskeletal: No chest wall abnormality. No acute or significant osseous findings. IMPRESSION: 1. Bilateral pulmonary emboli in the lower lobes and perhaps the right upper lobe. No heart strain identified. 2. Bibasilar pulmonary opacities. These opacities could represent pneumonia. Aspiration is possible. Pulmonary infarcts cannot be excluded given the pulmonary emboli. Recommend follow-up to resolution. 3. Coronary artery calcifications. 4. Mildly enlarged nodes and increased attenuation in the fat of the left axilla. This is a nonspecific finding. Recommend clinical correlation and short-term follow-up to resolution. 5. Shotty nodes in the mediastinum may be reactive  given the findings in the lungs. 6. Small pleural effusions.  Small pericardial effusion. Electronically Signed: By: Dorise Bullion III M.D On: 05/26/2017 10:08   Dg Chest Port 1 View  Result Date: 05/26/2017 CLINICAL DATA:  Aspiration into airway EXAM: PORTABLE CHEST 1 VIEW COMPARISON:  Three days ago FINDINGS: Left lower lobe consolidation is unchanged. No visible pleural fluid. Mildly low lung volumes. Stable generous heart size. IMPRESSION: Unchanged left lower lobe pneumonia. Electronically Signed   By: Monte Fantasia M.D.   On: 05/26/2017 07:29      Medications:     Scheduled Medications: . cyclobenzaprine  7.5 mg Oral TID  . digoxin  0.125 mg Oral Daily  . furosemide  80 mg Intravenous Q8H  . losartan  12.5 mg Oral BID  . omeprazole  20 mg Oral Daily  . pneumococcal 23 valent vaccine  0.5 mL Intramuscular Tomorrow-1000  . potassium chloride  20 mEq Oral BID  . spironolactone  25 mg Oral Daily     Infusions: .  heparin 1,750 Units/hr (05/25/17 2017)  . magnesium sulfate 1 - 4 g bolus IVPB 2 g (05/26/17 1053)  . meropenem (MERREM) IV       PRN Medications:  acetaminophen **OR** acetaminophen, bisacodyl, gi cocktail, LORazepam, ondansetron **OR** ondansetron (ZOFRAN) IV, senna-docusate   Assessment:   51 y/o smoke admitted with acute systolic HF with biventricular dysfunction EF 15-20%. Also note to have markedly positive VQ suggestive of PE.     Plan/Discussion:     1. Acute systolic HF with biventricular dysfunction - EF 15-20% by echo with severe RV dysfunction - improving somewhat but still with persistent volume overload and prominent sinus tach - continue digoxin and spiro and losartan. Increase losartan to 25 bid No b-blocker yet - continue diuresis - cut lasix back to 80iv bid - place TED hose - Plan R/L heart cath tomorrow (R radial and R groin due to RIJ and subclavian DVT)  2. Acute RIJ/sublcavian DVT with bilateral PE  - VQ with bilateral perfusion defects  - CT chest (reviewed personally) with bilateral PE but no clot in main PAs. No RV strain - continue heparin. Level 0.56 today.  - mother had multiple clots as well. Hypercoag panel ordered for am. Will ask Dr. Beryle Beams to weigh in as well.   3. Tobacco use -now quit  4. NSVT - keep K> 4.0 Mg > 2.0  5. Bilateral pulmonary infiltrates.  - treating for PNA. On meropenem per primary team - PCT only 0.38 - Check ANA, ANCA, RF    Long discussion with patient and family about CT findings and cath.   Total time spent 35 minutes. Over half that time spent discussing above.    Length of Stay: 5   Landry Kamath MD 05/26/2017, 1:58 PM  Advanced Heart Failure Team Pager [phone] (M-F; [place])  Please contact [facility] for night-coverage after hours (4p -7a ) and weekends on [email or web address]

## 2017-05-26 NOTE — Progress Notes (Signed)
Call received from Central Telemetry that patient had 7 beat run of V Tach, RN checked on patient who is sleeping with no s/s complications. RN paged provider to make her aware and will continue to assess patient.  P.J. Linus Mako, RN

## 2017-05-26 NOTE — Progress Notes (Signed)
ANTICOAGULATION CONSULT NOTE - Follow Up Consult  Pharmacy Consult for Heparin  Indication: pulmonary embolus  Allergies  Allergen Reactions  . Unasyn [Ampicillin-Sulbactam Sodium] Other (See Comments)    Pt gets hot/flushed with infusion     Patient Measurements: Height: 5\' 8"  (172.7 cm) Weight: 187 lb 14.4 oz (85.2 kg) IBW/kg (Calculated) : 63.9 kg Heparin Dosing Weight: 85 kg  Vital Signs: Temp: 98.4 F (36.9 C) (08/19 0647) Temp Source: Oral (08/19 0647) BP: 117/80 (08/19 0647) Pulse Rate: 109 (08/19 0647)  Labs:  Recent Labs  05/24/17 0457  05/25/17 0520 05/25/17 1907 05/26/17 0408  HGB 12.8  --  13.6  --  12.4  HCT 42.0  --  42.9  --  39.4  PLT 473*  --  604*  --  570*  HEPARINUNFRC <0.10*  < > 0.68 0.84* 0.69  CREATININE 0.88  --  0.87  --  0.87  < > = values in this interval not displayed.  Estimated Creatinine Clearance: 86.5 mL/min (by C-G formula based on SCr of 0.87 mg/dL).  Assessment: Heparin for new onset PE, heparin levels were low <0.1 previously and heparin drip rebolus given & rate increased to 2000 units/hr this AM.   RN later notes IV possibly infiltrated and started a new IV site with heparin infusing at new site ~ 10:30 this AM.   Heparin level therapeutic after rate reduction: 0.69 No bleeding or infusion issues documented  Goal of Therapy:  Heparin level 0.3-0.7 units/ml Monitor platelets by anticoagulation protocol: Yes   Plan:  Continue heparin gtt at 1750 units/hr  Daily heparin level and CBC  Monitor for s/sx of bleeding  Georga Bora, PharmD Clinical Pharmacist 05/26/2017 10:39 AM

## 2017-05-26 NOTE — Progress Notes (Addendum)
ANTICOAGULATION CONSULT NOTE - Follow Up Consult  Pharmacy Consult for Heparin  Indication: pulmonary embolus  Allergies  Allergen Reactions  . Unasyn [Ampicillin-Sulbactam Sodium] Other (See Comments)    Pt gets hot/flushed with infusion     Patient Measurements: Height: 5\' 8"  (172.7 cm) Weight: 187 lb 14.4 oz (85.2 kg) IBW/kg (Calculated) : 63.9 kg Heparin Dosing Weight: 85 kg  Vital Signs: Temp: 98.4 F (36.9 C) (08/19 0647) Temp Source: Oral (08/19 0647) BP: 121/79 (08/19 1040) Pulse Rate: 110 (08/19 1040)  Labs:  Recent Labs  05/24/17 0457  05/25/17 0520 05/25/17 1907 05/26/17 0408 05/26/17 1357  HGB 12.8  --  13.6  --  12.4  --   HCT 42.0  --  42.9  --  39.4  --   PLT 473*  --  604*  --  570*  --   HEPARINUNFRC <0.10*  < > 0.68 0.84* 0.69 0.56  CREATININE 0.88  --  0.87  --  0.87  --   < > = values in this interval not displayed.  Estimated Creatinine Clearance: 86.5 mL/min (by C-G formula based on SCr of 0.87 mg/dL).  Assessment: Heparin gtt for new onset PE. Confirmatory heparin level is therapeutic at 0.56. Hgb and PLTc stable.   Goal of Therapy:  Heparin level 0.3-0.7 units/ml Monitor platelets by anticoagulation protocol: Yes   Plan:  1. Continue heparin gtt at 1750 units/hr  2. Daily heparin level and CBC  3. Monitor for s/sx of bleeding   Vincenza Hews, PharmD, BCPS 05/26/2017, 2:55 PM

## 2017-05-26 NOTE — Plan of Care (Signed)
Problem: Tissue Perfusion: Goal: Risk factors for ineffective tissue perfusion will decrease Outcome: Progressing Maintains oxygen saturation in the low 90's, oxygen used as needed for shortness of breath low saturations   Problem: Fluid Volume: Goal: Ability to maintain a balanced intake and output will improve Outcome: Progressing Continues to diurese

## 2017-05-26 NOTE — Progress Notes (Signed)
Patient stable 7 a to 7 p, no complaints other than Tylenol for her back.  Patient states edema in legs and shortness of breath continues to improve.  Patient verbalizes understanding that she is nothing by mouth after midnight for cath 05/27/17.

## 2017-05-27 ENCOUNTER — Encounter (HOSPITAL_COMMUNITY)
Admission: EM | Disposition: A | Discharge status: Home / Self Care | Payer: Self-pay | Source: Home / Self Care | Attending: Family Medicine

## 2017-05-27 ENCOUNTER — Encounter (HOSPITAL_COMMUNITY): Payer: Self-pay | Admitting: Internal Medicine

## 2017-05-27 DIAGNOSIS — R590 Localized enlarged lymph nodes: Secondary | ICD-10-CM

## 2017-05-27 DIAGNOSIS — I2510 Atherosclerotic heart disease of native coronary artery without angina pectoris: Secondary | ICD-10-CM

## 2017-05-27 DIAGNOSIS — I2699 Other pulmonary embolism without acute cor pulmonale: Secondary | ICD-10-CM

## 2017-05-27 DIAGNOSIS — R490 Dysphonia: Secondary | ICD-10-CM

## 2017-05-27 DIAGNOSIS — I428 Other cardiomyopathies: Secondary | ICD-10-CM

## 2017-05-27 DIAGNOSIS — I5021 Acute systolic (congestive) heart failure: Secondary | ICD-10-CM

## 2017-05-27 DIAGNOSIS — I5041 Acute combined systolic (congestive) and diastolic (congestive) heart failure: Secondary | ICD-10-CM

## 2017-05-27 HISTORY — PX: RIGHT/LEFT HEART CATH AND CORONARY ANGIOGRAPHY: CATH118266

## 2017-05-27 LAB — CBC
HCT: 41.2 % (ref 36.0–46.0)
Hemoglobin: 12.9 g/dL (ref 12.0–15.0)
MCH: 27 pg (ref 26.0–34.0)
MCHC: 31.3 g/dL (ref 30.0–36.0)
MCV: 86.4 fL (ref 78.0–100.0)
PLATELETS: 577 10*3/uL — AB (ref 150–400)
RBC: 4.77 MIL/uL (ref 3.87–5.11)
RDW: 16.2 % — AB (ref 11.5–15.5)
WBC: 17.9 10*3/uL — AB (ref 4.0–10.5)

## 2017-05-27 LAB — POCT I-STAT 3, ART BLOOD GAS (G3+)
ACID-BASE EXCESS: 6 mmol/L — AB (ref 0.0–2.0)
Bicarbonate: 28.6 mmol/L — ABNORMAL HIGH (ref 20.0–28.0)
O2 Saturation: 95 %
TCO2: 30 mmol/L (ref 0–100)
pCO2 arterial: 35.8 mmHg (ref 32.0–48.0)
pH, Arterial: 7.512 — ABNORMAL HIGH (ref 7.350–7.450)
pO2, Arterial: 66 mmHg — ABNORMAL LOW (ref 83.0–108.0)

## 2017-05-27 LAB — POCT I-STAT 3, VENOUS BLOOD GAS (G3P V)
Acid-Base Excess: 7 mmol/L — ABNORMAL HIGH (ref 0.0–2.0)
Acid-Base Excess: 9 mmol/L — ABNORMAL HIGH (ref 0.0–2.0)
Bicarbonate: 31.4 mmol/L — ABNORMAL HIGH (ref 20.0–28.0)
Bicarbonate: 33 mmol/L — ABNORMAL HIGH (ref 20.0–28.0)
O2 SAT: 68 %
O2 Saturation: 65 %
PCO2 VEN: 41.1 mmHg — AB (ref 44.0–60.0)
PCO2 VEN: 41.5 mmHg — AB (ref 44.0–60.0)
PH VEN: 7.492 — AB (ref 7.250–7.430)
TCO2: 34 mmol/L (ref 0–100)
TCO2: 33 mmol/L (ref 0–100)
pH, Ven: 7.509 — ABNORMAL HIGH (ref 7.250–7.430)
pO2, Ven: 31 mmHg — CL (ref 32.0–45.0)
pO2, Ven: 32 mmHg (ref 32.0–45.0)

## 2017-05-27 LAB — BASIC METABOLIC PANEL
ANION GAP: 10 (ref 5–15)
BUN: 17 mg/dL (ref 6–20)
CALCIUM: 8.5 mg/dL — AB (ref 8.9–10.3)
CO2: 31 mmol/L (ref 22–32)
Chloride: 94 mmol/L — ABNORMAL LOW (ref 101–111)
Creatinine, Ser: 0.83 mg/dL (ref 0.44–1.00)
GFR calc Af Amer: >60 mL/min (ref >60)
GLUCOSE: 165 mg/dL — AB (ref 65–99)
Potassium: 4.1 mmol/L (ref 3.5–5.1)
SODIUM: 135 mmol/L (ref 135–145)

## 2017-05-27 LAB — RAPID URINE DRUG SCREEN, HOSP PERFORMED
Amphetamines: NEGATIVE — AB
BARBITURATES: NEGATIVE — AB
Benzodiazepines: NEGATIVE — AB
COCAINE: NEGATIVE — AB
OPIATES: POSITIVE — AB
Tetrahydrocannabinol: NEGATIVE — AB

## 2017-05-27 LAB — HEPARIN LEVEL (UNFRACTIONATED): Heparin Unfractionated: 0.39 IU/mL (ref 0.30–0.70)

## 2017-05-27 LAB — MAGNESIUM: MAGNESIUM: 2.2 mg/dL (ref 1.7–2.4)

## 2017-05-27 LAB — ANTITHROMBIN III: ANTITHROMB III FUNC: 72 % — AB (ref 75–120)

## 2017-05-27 LAB — SAVE SMEAR

## 2017-05-27 LAB — POCT ACTIVATED CLOTTING TIME: Activated Clotting Time: 164 seconds

## 2017-05-27 SURGERY — RIGHT/LEFT HEART CATH AND CORONARY ANGIOGRAPHY
Anesthesia: LOCAL

## 2017-05-27 MED ORDER — FENTANYL CITRATE (PF) 100 MCG/2ML IJ SOLN
INTRAMUSCULAR | Status: AC
  Filled 2017-05-27: qty 2

## 2017-05-27 MED ORDER — ONDANSETRON HCL 4 MG/2ML IJ SOLN: 4.0000 mg | Freq: Four times a day (QID) | INTRAMUSCULAR | Status: DC | PRN

## 2017-05-27 MED ORDER — FENTANYL CITRATE (PF) 100 MCG/2ML IJ SOLN
INTRAMUSCULAR | Status: DC | PRN
  Administered 2017-05-27: 25 ug via INTRAVENOUS

## 2017-05-27 MED ORDER — APIXABAN 5 MG PO TABS
10.0000 mg | ORAL_TABLET | Freq: Two times a day (BID) | ORAL | Status: DC
  Administered 2017-05-27: 10 mg via ORAL
  Filled 2017-05-27 (×3): qty 2

## 2017-05-27 MED ORDER — MIDAZOLAM HCL 2 MG/2ML IJ SOLN
INTRAMUSCULAR | Status: DC | PRN
  Administered 2017-05-27: 2 mg via INTRAVENOUS

## 2017-05-27 MED ORDER — HEPARIN SODIUM (PORCINE) 1000 UNIT/ML IJ SOLN
INTRAMUSCULAR | Status: AC
  Filled 2017-05-27: qty 1

## 2017-05-27 MED ORDER — SODIUM CHLORIDE 0.9% FLUSH
3.0000 mL | Freq: Two times a day (BID) | INTRAVENOUS | Status: DC
  Administered 2017-05-27 – 2017-06-01 (×9): 3 mL via INTRAVENOUS

## 2017-05-27 MED ORDER — MIDAZOLAM HCL 2 MG/2ML IJ SOLN
INTRAMUSCULAR | Status: AC
Start: 2017-05-27 — End: ?
  Filled 2017-05-27: qty 2

## 2017-05-27 MED ORDER — IOPAMIDOL (ISOVUE-370) INJECTION 76%
INTRAVENOUS | Status: DC | PRN
  Administered 2017-05-27: 55 mL via INTRA_ARTERIAL

## 2017-05-27 MED ORDER — SODIUM CHLORIDE 0.9 % IV SOLN: INTRAVENOUS | Status: AC

## 2017-05-27 MED ORDER — IOPAMIDOL (ISOVUE-370) INJECTION 76%
INTRAVENOUS | Status: AC
  Filled 2017-05-27: qty 100

## 2017-05-27 MED ORDER — SODIUM CHLORIDE 0.9% FLUSH: 3.0000 mL | INTRAVENOUS | Status: DC | PRN

## 2017-05-27 MED ORDER — LIDOCAINE HCL (PF) 1 % IJ SOLN
INTRAMUSCULAR | Status: DC | PRN
  Administered 2017-05-27: 15 mL via INTRADERMAL
  Administered 2017-05-27: 2 mL via INTRADERMAL

## 2017-05-27 MED ORDER — HEPARIN (PORCINE) IN NACL 2-0.9 UNIT/ML-% IJ SOLN
INTRAMUSCULAR | Status: DC | PRN
  Administered 2017-05-27: 10 mL via INTRA_ARTERIAL

## 2017-05-27 MED ORDER — ACETAMINOPHEN 325 MG PO TABS: 650.0000 mg | ORAL_TABLET | ORAL | Status: DC | PRN

## 2017-05-27 MED ORDER — LIDOCAINE HCL (PF) 1 % IJ SOLN
INTRAMUSCULAR | Status: AC
  Filled 2017-05-27: qty 30

## 2017-05-27 MED ORDER — HEPARIN SODIUM (PORCINE) 1000 UNIT/ML IJ SOLN
INTRAMUSCULAR | Status: DC | PRN
  Administered 2017-05-27: 4000 [IU] via INTRAVENOUS

## 2017-05-27 MED ORDER — SODIUM CHLORIDE 0.9 % IV SOLN: 250.0000 mL | INTRAVENOUS | Status: DC | PRN

## 2017-05-27 MED ORDER — HEPARIN (PORCINE) IN NACL 2-0.9 UNIT/ML-% IJ SOLN
INTRAMUSCULAR | Status: AC
  Filled 2017-05-27: qty 1000

## 2017-05-27 MED ORDER — HEPARIN (PORCINE) IN NACL 2-0.9 UNIT/ML-% IJ SOLN
INTRAMUSCULAR | Status: AC | PRN
  Administered 2017-05-27: 1000 mL

## 2017-05-27 MED ORDER — VERAPAMIL HCL 2.5 MG/ML IV SOLN
INTRAVENOUS | Status: AC
  Filled 2017-05-27: qty 2

## 2017-05-27 SURGICAL SUPPLY — 12 items
CATH 5FR JL3.5 JR4 ANG PIG MP (CATHETERS) ×2 IMPLANT
CATH SWAN GANZ 7F STRAIGHT (CATHETERS) ×2 IMPLANT
DEVICE RAD COMP TR BAND LRG (VASCULAR PRODUCTS) ×2 IMPLANT
GLIDESHEATH SLEND SS 6F .021 (SHEATH) ×2 IMPLANT
GUIDEWIRE INQWIRE 1.5J.035X260 (WIRE) ×1 IMPLANT
INQWIRE 1.5J .035X260CM (WIRE) ×2
KIT HEART LEFT (KITS) ×2 IMPLANT
PACK CARDIAC CATHETERIZATION (CUSTOM PROCEDURE TRAY) ×2 IMPLANT
SHEATH GLIDE SLENDER 4/5FR (SHEATH) ×2 IMPLANT
SHEATH PINNACLE 7F 10CM (SHEATH) ×2 IMPLANT
TRANSDUCER W/STOPCOCK (MISCELLANEOUS) ×2 IMPLANT
TUBING CIL FLEX 10 FLL-RA (TUBING) ×2 IMPLANT

## 2017-05-27 NOTE — Progress Notes (Signed)
Text paged Kylie Bennett to notify patient had an episode of 6 beats of V-tach. Patient denies chest pain . Vital signs documented in flow sheet. Awaiting respose

## 2017-05-27 NOTE — Progress Notes (Signed)
Site area: rt groin fv sheath Site Prior to Removal:  Level 0 Pressure Applied For: 10 minutes Manual:   yes Patient Status During Pull:  stable Post Pull Site:  Level 0 Post Pull Instructions Given:  yes Post Pull Pulses Present: dopplered Dressing Applied:  Gauze and tegaderm Bedrest begins @ 0935 Comments:

## 2017-05-27 NOTE — Consult Note (Signed)
Referring MD: Dr. Alice Rieger  PCP:  Patient, No Pcp Per   Reason for Referral:  Hematology oncology consultation:  Acute bilateral pulmonary emboli and possible left subclavian vein thrombosis in a lady who presents with acute biventricular dilated cardiomyopathy  Chief Complaint  Patient presents with  . Shortness of Breath    HPI:  51 year old woman who has been in overall excellent health without any major medical or surgical illness.  About 1 month ago she developed a bronchitis.  Up until that time she was a 1 pack per day cigarette smoker.  She went to an urgent care.  She was treated with amoxicillin.  Cough improved but she developed the indolent onset of increasing dyspnea and lower extremity edema over the next few weeks.  She came to the emergency department for further evaluation.  Chest x-ray showed cardiomegaly with vascular congestion.  BNP 2921.6 units.  Troponin elevated 0.48, 0.51, 0.46.  EKG with sinus tachycardia.  No gross acute ischemic change or arrhythmia.  An echocardiogram showed a severe biventricular dilated cardiomyopathy, left ventricular ejection fraction 15-20%, severe tricuspid regurgitation, pulmonary artery pressure 46.  While in the hospital under evaluation, she developed swelling of her left arm.  A d-dimer significantly elevated at 19.27.  CT scan of the chest with contrast but not the pulmonary angiogram technique was still able to demonstrate bilateral lower lobe pulmonary emboli.  This was confirmed with a subsequent nuclear medicine ventilation/perfusion scan.  There is a question of whether or not she has a left subclavian DVT and a right jugular vein DVT.  I reviewed the scans with the radiologist.  There does not appear to be any thoracic outlet obstruction and there does not appear to be a extra cervical rib.  There does appear to be bilateral left greater than right areas of pulmonary infarction. Left axillary adenopathy is noted and felt to be possibly  reactive.  A cardiac cath was done today and confirms severe nonischemic cardiomyopathy with ejection fraction 15% by echo and thick cardiac output/index 5.4/2.7.  Minimal coronary disease with 40% distal LAD and 20% mid RCA stenosis.  Widely patent left main.  She has had a hoarse voice for a number of years.  Improved since she stopped smoking a month ago. She has had absolutely no health maintenance exams including mammograms or colonoscopies. She denies any constitutional symptoms.  Appetite is good.  Weight is steady except for recent fluid gain. She has no signs or symptoms of a collagen vascular disorder.  No polyarthralgias or polymyalgias.  No excessive hair loss.  No unusual skin rashes.  Mother had pulmonary emboli at age 37 following a prolonged pneumonia.  One year later she was diagnosed with breast cancer which rapidly metastasized to bone and liver resulting in her demise at age 59.  Father was a heavy smoker and had an MI when he was 23 years old. 2 sisters aged 73 and 28 accompanying her this evening and are in good health. A 12 year old daughter is healthy.   Past Medical History:  Diagnosis Date  . GERD (gastroesophageal reflux disease)   . Seasonal allergies   :  Past Surgical History:  Procedure Laterality Date  . RIGHT/LEFT HEART CATH AND CORONARY ANGIOGRAPHY N/A 05/27/2017   Procedure: RIGHT/LEFT HEART CATH AND CORONARY ANGIOGRAPHY;  Surgeon: Jolaine Artist, MD;  Location: [facility];  Service: Cardiovascular;  Laterality: N/A;  :  . apixaban  10 mg Oral BID  . cyclobenzaprine  7.5  mg Oral TID  . digoxin  0.125 mg Oral Daily  . guaiFENesin  600 mg Oral BID  . losartan  25 mg Oral BID  . omeprazole  20 mg Oral Daily  . pneumococcal 23 valent vaccine  0.5 mL Intramuscular Tomorrow-1000  . potassium chloride  20 mEq Oral BID  . sodium chloride flush  3 mL Intravenous Q12H  . spironolactone  25 mg Oral Daily  :  Allergies  Allergen Reactions  .  Unasyn [Ampicillin-Sulbactam Sodium] Other (See Comments)    Pt gets hot/flushed with infusion   :  Family History  Problem Relation Age of Onset  . COPD Mother   . Rheum arthritis Mother   . Breast cancer Mother   . Hypertension Father   . Skin cancer Father   . Heart attack Father        age 2s  . Heart attack Paternal Grandfather   . Stroke Maternal Grandfather   :  Social History   Social History  . Marital status: Single    Spouse name: N/A  . Number of children: N/A  . Years of education: N/A   Occupational History  . Not on file.   Social History Main Topics  . Smoking status: Former Smoker    Packs/day: 1.00    Types: Cigarettes  . Smokeless tobacco: Never Used  . Alcohol use Yes  . Drug use: No  . Sexual activity: Not on file   Other Topics Concern  . Not on file   Social History Narrative  . No narrative on file  :  ROS: Eyes: No recent change in vision.  No blurred vision or diplopia Throat: See HPI Neck: Resp: See HPI Cardio: See HPI GI: No abdominal pain.  No change in bowel habit.  No hematochezia.  No melena. Extremities:  Lymph nodes:  Neurologic: No headache.  No focal weakness.  No paresthesias. Skin: .  See HPI Genitourinary: No hematuria.  No vaginal bleeding.  Vitals: Vitals:   05/27/17 1159 05/27/17 1300  BP: 119/79 125/87  Pulse: 99 (!) 111  Resp:    Temp:    SpO2:  100%    PHYSICAL EXAM: General appearance: Well-nourished Caucasian woman.  Very hoarse gravelly voice. HEENT: There is no mass or ulcer in the oropharynx. Lymph Nodes: No cervical, right axillary, or inguinal adenopathy.  Approximate 3 cm node palpable in the left axilla Resp: Resonant to percussion.  Rales at the lung bases left greater than right Cardio: Regular cardiac rhythm.  No appreciable murmur or gallop.  No rub.  No JVD. Vascular: Dorsalis pedis pulses 2+ symmetric. Breasts: No skin changes.  No retraction or dimpling.  No dominant mass. GI:  Abdomen is soft and obese, nontender, no mass, no organomegaly GU: Not examined Extremities: 1-2+ swelling of the left upper extremity.  Neurovascular compromise.  1+ edema of the calves..  No calf tenderness. Neurologic: Mental status intact, cranial nerves grossly normal, pupils equal round reactive to light, palate elevates symmetrically, tongue midline, no facial asymmetry, neck is supple, motor strength is 5/5 all extremities, upper body coordination is normal, reflexes 1+ symmetric at the biceps and at the knees. Skin: No rash or ecchymosis Rectal exam deferred  Labs:   Recent Labs  05/26/17 0408 05/27/17 0255  WBC 25.3* 17.9*  HGB 12.4 12.9  HCT 39.4 41.2  PLT 570* 577*    Recent Labs  05/26/17 0408 05/27/17 0255  NA 134* 135  K 3.4* 4.1  CL  91* 94*  CO2 30 31  GLUCOSE 191* 165*  BUN 20 17  CREATININE 0.87 0.83  CALCIUM 8.2* 8.5*      Images Studies/Results:  Ct Chest W Contrast  Addendum Date: 05/26/2017   ADDENDUM REPORT: 05/26/2017 10:25 ADDENDUM: The findings were called to Dr. Verlon Au. The patient had an ultrasound yesterday which is not available for my review. After discussing with the patient's referring physician, I suspect DVT in the right jugular vein which is expanded and unopacified. I also suspect the increased attenuation of fat in the left axilla, which in retrospect extends along the left subclavian vein, is likely secondary to a left subclavian DVT. This could also explain the mild adenopathy in the left axilla. No anatomic causes for the DVTs is identified. Electronically Signed   By: Dorise Bullion III M.D   On: 05/26/2017 10:25   Result Date: 05/26/2017 CLINICAL DATA:  Concern for pneumonia. EXAM: CT CHEST WITH CONTRAST TECHNIQUE: Multidetector CT imaging of the chest was performed during intravenous contrast administration. CONTRAST:  26mL ISOVUE-300 IOPAMIDOL (ISOVUE-300) INJECTION 61% COMPARISON:  Multiple chest x-rays since May 21, 2017  FINDINGS: Cardiovascular: Coronary artery calcifications are identified. Cardiomegaly. The thoracic aorta is unremarkable. This is not a study tailored to evaluate for pulmonary emboli. However, there are emboli located in the lower lobe pulmonary arteries. Evaluation of the proximal right upper lobe pulmonary artery is not well assessed due to streak artifact off the SVC but an embolus is not excluded. No emboli in the main pulmonary arteries. The left ventricle demonstrates a diameter of 7.3 cm and the right ventricle demonstrates a diameter of 3.9 cm, with a ratio of well less than 0.9. No evidence of heart strain. Mediastinum/Nodes: Prominent nodes are seen in the left axilla, asymmetric to the right with a E representative node on series 5, image 41 measuring 12 mm. There is mild increased attenuation of fat of the left axilla is while. Shotty nodes are seen in the mediastinum without overt adenopathy. Tiny effusions are seen in the pleural spaces. A small pericardial effusion is noted posteriorly. The esophagus is normal. The thyroid is unremarkable. Lungs/Pleura: The central airways are normal. No pneumothorax. Bibasilar infiltrates are noted in in medial aspect of the right lower lobe and more diffusely in the left lower lobe. No other abnormalities are seen within the lungs. Upper Abdomen: Cholelithiasis. No other abnormalities in the upper abdomen. Musculoskeletal: No chest wall abnormality. No acute or significant osseous findings. IMPRESSION: 1. Bilateral pulmonary emboli in the lower lobes and perhaps the right upper lobe. No heart strain identified. 2. Bibasilar pulmonary opacities. These opacities could represent pneumonia. Aspiration is possible. Pulmonary infarcts cannot be excluded given the pulmonary emboli. Recommend follow-up to resolution. 3. Coronary artery calcifications. 4. Mildly enlarged nodes and increased attenuation in the fat of the left axilla. This is a nonspecific finding. Recommend  clinical correlation and short-term follow-up to resolution. 5. Shotty nodes in the mediastinum may be reactive given the findings in the lungs. 6. Small pleural effusions.  Small pericardial effusion. Electronically Signed: By: Dorise Bullion III M.D On: 05/26/2017 10:08   Dg Chest Port 1 View  Result Date: 05/26/2017 CLINICAL DATA:  Aspiration into airway EXAM: PORTABLE CHEST 1 VIEW COMPARISON:  Three days ago FINDINGS: Left lower lobe consolidation is unchanged. No visible pleural fluid. Mildly low lung volumes. Stable generous heart size. IMPRESSION: Unchanged left lower lobe pneumonia. Electronically Signed   By: Monte Fantasia M.D.   On: 05/26/2017  07:29    Impression: 1.  Acute onset of severe biventricular dilated nonischemic cardiomyopathy.  Rule out myocarditis viral versus immune. 2.  Bilateral pulmonary emboli with some suspicion of left subclavian vein thrombosis and right jugular vein thrombosis.  Radiologist that I reviewed scans with was not convinced that she had clots in these locations and recommends an ultrasound of the left upper extremity.. In the face of a severe dilated cardiomyopathy I am more likely to ascribe pulmonary emboli due to low cardiac output and venous stasis.  However, I am concerned with the possibility that her clots may be related to an underlying occult malignancy given her family history, her smoking history, her hoarse voice and the left axillary adenopathy which in my opinion cannot be called reactive until proven otherwise.  I doubt very much that she has an inherited or acquired coagulopathy and whatever we find on an expanded laboratory profile will not change the clinical management given the severe and unprovoked nature of these clots. 3.  Leukocytosis and thrombocytosis likely related to pulmonary infarction. We do not have any baseline data prior to this admission.  Her initial platelet count was normal so I believe that this is likely reactive.  I  doubt that she has an underlying myeloproliferative disorder.    Recommendation: I agree with long-term, full dose, anticoagulation irrespective of what a "hypercoagulable profile" shows. Once her cardiac status is stable, she needs to get mammograms and a left axillary lymph node biopsy to rule out occult breast cancer. She needs an ear nose and throat surgery evaluation to rule out occult head and neck cancer.  Murriel Hopper, MD, [place]  Hematology-Oncology/Internal Medicine  05/27/2017, 6:48 PM

## 2017-05-27 NOTE — Progress Notes (Signed)
Right radial site dressing applied, no hematoma noted level 0. Right femoral dressing is clean dry intact

## 2017-05-27 NOTE — Progress Notes (Signed)
Advanced Heart Failure Rounding Note   Subjective:    Lasix cut back to bid yesterday. Out -1.5L. Weight up 3 pounds.   Breathing ok. No bleeding on heparin.    Underwent CT chest this am (reviewed personally):  IMPRESSION: 1. Bilateral pulmonary emboli in the lower lobes and perhaps the right upper lobe. No heart strain identified. 2. Bibasilar pulmonary opacities. These opacities could represent pneumonia. Aspiration is possible. Pulmonary infarcts cannot be excluded given the pulmonary emboli. Recommend follow-up to resolution. 3. Coronary artery calcifications. 4. Mildly enlarged nodes and increased attenuation in the fat of the left axilla. This is a nonspecific finding. Recommend clinical correlation and short-term follow-up to resolution. 5. Shotty nodes in the mediastinum may be reactive given the findings in the lungs. 6. Small pleural effusions.  Small pericardial effusion.   Objective:   Weight Range:  Vital Signs:   Temp:  [97.6 F (36.4 C)-99.6 F (37.6 C)] 99.6 F (37.6 C) (08/20 0544) Pulse Rate:  [109-122] 122 (08/20 0544) Resp:  [18-20] 20 (08/20 0544) BP: (121-149)/(79-93) 149/93 (08/20 0544) SpO2:  [91 %-95 %] 91 % (08/20 0805) Weight:  [86.2 kg (190 lb)] 86.2 kg (190 lb) (08/20 0544) Last BM Date: 05/26/17  Weight change: Filed Weights   05/25/17 0424 05/26/17 0647 05/27/17 0544  Weight: 88.1 kg (194 lb 4.8 oz) 85.2 kg (187 lb 14.4 oz) 86.2 kg (190 lb)    Intake/Output:   Intake/Output Summary (Last 24 hours) at 05/27/17 0811 Last data filed at 05/27/17 0620  Gross per 24 hour  Intake          1483.33 ml  Output             2600 ml  Net         -1116.67 ml     Physical Exam: General:  Lying in bed. No resp difficulty HEENT: normal Neck: supple. JVP 9. Carotids 2+ bilat; no bruits. No lymphadenopathy or thryomegaly appreciated. Cor: PMI nondisplaced. Tachy regular Lungs: clear but decreased throughout  Abdomen: obese soft,  nontender, nondistended. No hepatosplenomegaly. No bruits or masses. Good bowel sounds. Extremities: no cyanosis, clubbing, rash, 2+ edema Neuro: alert & orientedx3, cranial nerves grossly intact. moves all 4 extremities w/o difficulty. Affect pleasant   Telemetry: sinus tach 100-110 occasional NSVT overnight Personally reviewed   Labs: Basic Metabolic Panel:  Recent Labs Lab 05/23/17 0408 05/24/17 0457 05/25/17 0520 05/26/17 0408 05/27/17 0255  NA 138 138 136 134* 135  K 3.5 3.7 3.4* 3.4* 4.1  CL 93* 93* 90* 91* 94*  CO2 28 26 31 30 31   GLUCOSE 121* 124* 144* 191* 165*  BUN 19 19 13 20 17   CREATININE 0.88 0.88 0.87 0.87 0.83  CALCIUM 8.7* 8.2* 8.6* 8.2* 8.5*  MG 1.5* 1.2* 1.3* 2.0 2.2    Liver Function Tests:  Recent Labs Lab 05/24/17 0457 05/25/17 0520  AST 47* 40  ALT 22 22  ALKPHOS 75 76  BILITOT 1.8* 1.6*  PROT 5.9* 7.3  ALBUMIN 2.0* 2.3*   No results for input(s): LIPASE, AMYLASE in the last 168 hours. No results for input(s): AMMONIA in the last 168 hours.  CBC:  Recent Labs Lab 05/23/17 0408 05/24/17 0457 05/25/17 0520 05/26/17 0408 05/27/17 0255  WBC 22.1* 18.1* 20.4* 25.3* 17.9*  NEUTROABS 16.4*  --   --   --   --   HGB 12.9 12.8 13.6 12.4 12.9  HCT 41.5 42.0 42.9 39.4 41.2  MCV 88.1 87.3 86.0  85.3 86.4  PLT 478* 473* 604* 570* 577*    Cardiac Enzymes:  Recent Labs Lab 05/21/17 2100 05/22/17 0203 05/22/17 0508  TROPONINI 0.48* 0.51* 0.46*    BNP: BNP (last 3 results)  Recent Labs  05/21/17 2100  BNP 2,921.6*    ProBNP (last 3 results) No results for input(s): PROBNP in the last 8760 hours.    Other results:  Imaging: Ct Chest W Contrast  Addendum Date: 05/26/2017   ADDENDUM REPORT: 05/26/2017 10:25 ADDENDUM: The findings were called to Dr. Verlon Au. The patient had an ultrasound yesterday which is not available for my review. After discussing with the patient's referring physician, I suspect DVT in the right jugular  vein which is expanded and unopacified. I also suspect the increased attenuation of fat in the left axilla, which in retrospect extends along the left subclavian vein, is likely secondary to a left subclavian DVT. This could also explain the mild adenopathy in the left axilla. No anatomic causes for the DVTs is identified. Electronically Signed   By: Dorise Bullion III M.D   On: 05/26/2017 10:25   Result Date: 05/26/2017 CLINICAL DATA:  Concern for pneumonia. EXAM: CT CHEST WITH CONTRAST TECHNIQUE: Multidetector CT imaging of the chest was performed during intravenous contrast administration. CONTRAST:  71mL ISOVUE-300 IOPAMIDOL (ISOVUE-300) INJECTION 61% COMPARISON:  Multiple chest x-rays since May 21, 2017 FINDINGS: Cardiovascular: Coronary artery calcifications are identified. Cardiomegaly. The thoracic aorta is unremarkable. This is not a study tailored to evaluate for pulmonary emboli. However, there are emboli located in the lower lobe pulmonary arteries. Evaluation of the proximal right upper lobe pulmonary artery is not well assessed due to streak artifact off the SVC but an embolus is not excluded. No emboli in the main pulmonary arteries. The left ventricle demonstrates a diameter of 7.3 cm and the right ventricle demonstrates a diameter of 3.9 cm, with a ratio of well less than 0.9. No evidence of heart strain. Mediastinum/Nodes: Prominent nodes are seen in the left axilla, asymmetric to the right with a E representative node on series 5, image 41 measuring 12 mm. There is mild increased attenuation of fat of the left axilla is while. Shotty nodes are seen in the mediastinum without overt adenopathy. Tiny effusions are seen in the pleural spaces. A small pericardial effusion is noted posteriorly. The esophagus is normal. The thyroid is unremarkable. Lungs/Pleura: The central airways are normal. No pneumothorax. Bibasilar infiltrates are noted in in medial aspect of the right lower lobe and more  diffusely in the left lower lobe. No other abnormalities are seen within the lungs. Upper Abdomen: Cholelithiasis. No other abnormalities in the upper abdomen. Musculoskeletal: No chest wall abnormality. No acute or significant osseous findings. IMPRESSION: 1. Bilateral pulmonary emboli in the lower lobes and perhaps the right upper lobe. No heart strain identified. 2. Bibasilar pulmonary opacities. These opacities could represent pneumonia. Aspiration is possible. Pulmonary infarcts cannot be excluded given the pulmonary emboli. Recommend follow-up to resolution. 3. Coronary artery calcifications. 4. Mildly enlarged nodes and increased attenuation in the fat of the left axilla. This is a nonspecific finding. Recommend clinical correlation and short-term follow-up to resolution. 5. Shotty nodes in the mediastinum may be reactive given the findings in the lungs. 6. Small pleural effusions.  Small pericardial effusion. Electronically Signed: By: Dorise Bullion III M.D On: 05/26/2017 10:08   Dg Chest Port 1 View  Result Date: 05/26/2017 CLINICAL DATA:  Aspiration into airway EXAM: PORTABLE CHEST 1 VIEW COMPARISON:  Three  days ago FINDINGS: Left lower lobe consolidation is unchanged. No visible pleural fluid. Mildly low lung volumes. Stable generous heart size. IMPRESSION: Unchanged left lower lobe pneumonia. Electronically Signed   By: Monte Fantasia M.D.   On: 05/26/2017 07:29     Medications:     Scheduled Medications: . [MAR Hold] cyclobenzaprine  7.5 mg Oral TID  . [MAR Hold] digoxin  0.125 mg Oral Daily  . [MAR Hold] furosemide  80 mg Intravenous BID  . [MAR Hold] guaiFENesin  600 mg Oral BID  . [MAR Hold] losartan  25 mg Oral BID  . [MAR Hold] omeprazole  20 mg Oral Daily  . [MAR Hold] pneumococcal 23 valent vaccine  0.5 mL Intramuscular Tomorrow-1000  . [MAR Hold] potassium chloride  20 mEq Oral BID  . sodium chloride flush  3 mL Intravenous Q12H  . [MAR Hold] spironolactone  25 mg Oral  Daily    Infusions: . sodium chloride    . sodium chloride 10 mL/hr at 05/27/17 0115  . heparin 1,750 Units/hr (05/27/17 0114)  . [MAR Hold] magnesium sulfate 1 - 4 g bolus IVPB Stopped (05/27/17 0006)  . [MAR Hold] meropenem (MERREM) IV Stopped (05/27/17 9326)    PRN Medications: sodium chloride, [MAR Hold] acetaminophen **OR** [MAR Hold] acetaminophen, [MAR Hold] bisacodyl, [MAR Hold] gi cocktail, [MAR Hold] LORazepam, [MAR Hold] ondansetron **OR** [MAR Hold] ondansetron (ZOFRAN) IV, [MAR Hold] senna-docusate, sodium chloride flush   Assessment:   51 y/o smoke admitted with acute systolic HF with biventricular dysfunction EF 15-20%. Also note to have markedly positive VQ suggestive of PE.     Plan/Discussion:     1. Acute systolic HF with biventricular dysfunction - EF 15-20% by echo with severe RV dysfunction - volume improving but still tenuous - continue digoxin and spiro and losartan. BP improving. Consider switch to [facility] if BP will tolerate. No b-blocker yet  - place TED hose - Plan R/L heart cath today  2. Acute RIJ/sublcavian DVT with bilateral PE  - VQ with bilateral perfusion defects  - CT chest (reviewed personally) with bilateral PE but no clot in main PAs. No RV strain - continue heparin. Level 0.39 today.  - mother had multiple clots as well. Hypercoag panel in process. Will ask Dr. Beryle Beams to weigh in as well.   3. Tobacco use -now quit  4. NSVT - keep K> 4.0 Mg > 2.0  5. Bilateral pulmonary infiltrates.  - treating for PNA. On meropenem per primary team - PCT only 0.38 - ANA, ANCA and RF  ssing above.    Length of Stay: 6   Bensimhon, Daniel MD 05/27/2017, 8:11 AM  Advanced Heart Failure Team Pager [phone] (M-F; 7a - 4p)  Please contact [facility] for night-coverage after hours (4p -7a ) and weekends on [email or web address]

## 2017-05-27 NOTE — Interval H&P Note (Signed)
History and Physical Interval Note:  05/27/2017 8:11 AM  Kylie Bennett  has presented today for surgery, with the diagnosis of CHF  The various methods of treatment have been discussed with the patient and family. After consideration of risks, benefits and other options for treatment, the patient has consented to  Procedure(s): RIGHT/LEFT HEART CATH AND CORONARY ANGIOGRAPHY (N/A) and possible coronary angioplasty as a surgical intervention .  The patient's history has been reviewed, patient examined, no change in status, stable for surgery.  I have reviewed the patient's chart and labs.  Questions were answered to the patient's satisfaction.     Kieran Nachtigal, Quillian Quince

## 2017-05-27 NOTE — Progress Notes (Addendum)
Patient refused to watch cath video education

## 2017-05-27 NOTE — Progress Notes (Addendum)
ANTICOAGULATION CONSULT NOTE - Follow Up Consult  Pharmacy Consult for Heparin  Indication: pulmonary embolus  Allergies  Allergen Reactions  . Unasyn [Ampicillin-Sulbactam Sodium] Other (See Comments)    Pt gets hot/flushed with infusion     Patient Measurements: Height: 5\' 8"  (172.7 cm) Weight: 190 lb (86.2 kg) IBW/kg (Calculated) : 63.9 kg Heparin Dosing Weight: 85 kg  Vital Signs: Temp: 99.6 F (37.6 C) (08/20 0544) Temp Source: Oral (08/20 0544) BP: 127/86 (08/20 0953) Pulse Rate: 105 (08/20 0940)  Labs:  Recent Labs  05/25/17 0520  05/26/17 0408 05/26/17 1357 05/27/17 0255 05/27/17 0307  HGB 13.6  --  12.4  --  12.9  --   HCT 42.9  --  39.4  --  41.2  --   PLT 604*  --  570*  --  577*  --   HEPARINUNFRC 0.68  < > 0.69 0.56  --  0.39  CREATININE 0.87  --  0.87  --  0.83  --   < > = values in this interval not displayed.  Estimated Creatinine Clearance: 91.1 mL/min (by C-G formula based on SCr of 0.83 mg/dL).  Assessment: Heparin gtt for new onset bilateral PE found on CT from 8/19. Levels have been within therapeutic range, most recent is 0.39 today. Hgb and PLTc stable.   Goal of Therapy:  Heparin level 0.3-0.7 units/ml Monitor platelets by anticoagulation protocol: Yes   Plan:  1. Discontinue heparin gtt at 1750 units/hr post-cath  2. Physician ordered apixaban to start at 10 mg twice daily tonight after cath 3. Monitor for s/sx of bleeding 4. Will follow along for plans for plans to transition to 5 mg twice daily after 7 days   Doylene Canard, PharmD Clinical Pharmacist  Pager: [phone] 05/27/2017, 9:58 AM

## 2017-05-27 NOTE — Progress Notes (Signed)
15:24 received a call from CCM that pt had 5 runs of VTach., pt is asymptomatic at that time . Will continue to monitor.

## 2017-05-27 NOTE — Progress Notes (Signed)
PROGRESS NOTE    Kylie Bennett  FWY:[id number] DOB: 1966-05-03 DOA: 05/21/2017 PCP: Patient, No Pcp Per  Outpatient Specialists:     Brief Narrative:   61 white ? Recent smoker until 04/2017 Note that patient declined labs or chest x-ray at that visit Hypertension  For the past 5 weeks patient has been feeling poorly since quitting smoking She developed bronchitic-like symptoms Recently seen at no vomiting treated for bronchitis? Antibiotics-Augmentin Has had multiple short trips out of town, none specifically > 4 hours duration             Documentation notes lower leg edema shortness of breath with activity and yellow-green sputum   Assessment & Plan:   Principal Problem:   Acute CHF (congestive heart failure) ([place]) Active Problems:   Hyponatremia   Community acquired pneumonia of right lower lobe of lung ([place])   Acute systolic heart failure (HCC)   Acute systolic + diastolic CHF Ef 27%--? NICM of unknown etiology--Coronaries are clean on RHC/LHC 7/41 Grade 3 Diastolic parameters +  Lasix 40 ii/day-->80ii/day--->80iii/day-NOW ON HOLD further adj per cardiology  Aldactone 25 started 8/15  Digoxin 0.125 started 8/15--- continue same dose  Creat stable  I/o so far = - 20 liters so far--weight 212-->204-->187-->190    D Dimer 19 Recent long travels, smoker-not on estrogen replacement + PE on V/Q scan  CTA 8/19 as below ? 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              Needs outpatient counseling regarding the same   might benefit from bariatric referral versus structured weight loss program  Defer aggressive management of DM for right now--If sugars abive 200 initate SSI   Consultants:   Cardiology  Procedures:   VQ scan8/16 high probability PE  Cardiac cath 8/20: 1. Mild non obstructive CAD 2. Severe NICM with EF 15% by echo 3. Well-compensated hemodynamics   Antimicrobials:   Augmentin -->merrem   Subjective:  Fair SOb improved overall improved mmild sore throat, no fever no chills Muscle spasm improved   Objective: Vitals:   05/27/17 1107 05/27/17 1125 05/27/17 1159 05/27/17 1300  BP: 121/86 120/81 119/79 125/87  Pulse: (!) 103 (!) 104 99 (!) 111  Resp:      Temp:      TempSrc:      SpO2:    100%  Weight:      Height:        Intake/Output Summary (Last 24 hours) at 05/27/17 1724 Last data filed at 05/27/17 0620  Gross per 24 hour  Intake          1293.33 ml  Output  1100 ml  Net           193.33 ml   Filed Weights   05/25/17 0424 05/26/17 0647 05/27/17 0544  Weight: 88.1 kg (194 lb 4.8 oz) 85.2 kg (187 lb 14.4 oz) 86.2 kg (190 lb)    Examination:  Looks well Sitting up eating crackers s1 s2 no m/r/g Chest crackles on R post abd soft  Neuro intact 2+ pitting edema  Data Reviewed: I have personally reviewed following labs and imaging studies  CBC:  Recent Labs Lab 05/23/17 0408 05/24/17 0457 05/25/17 0520 05/26/17 0408 05/27/17 0255  WBC 22.1* 18.1* 20.4* 25.3* 17.9*  NEUTROABS 16.4*  --   --   --   --   HGB 12.9 12.8 13.6 12.4 12.9  HCT 41.5 42.0 42.9 39.4 41.2   MCV 88.1 87.3 86.0 85.3 86.4  PLT 478* 473* 604* 570* 220*   Basic Metabolic Panel:  Recent Labs Lab 05/23/17 0408 05/24/17 0457 05/25/17 0520 05/26/17 0408 05/27/17 0255  NA 138 138 136 134* 135  K 3.5 3.7 3.4* 3.4* 4.1  CL 93* 93* 90* 91* 94*  CO2 28 26 31 30 31   GLUCOSE 121* 124* 144* 191* 165*  BUN 19 19 13 20 17   CREATININE 0.88 0.88 0.87 0.87 0.83  CALCIUM 8.7* 8.2* 8.6* 8.2* 8.5*  MG 1.5* 1.2* 1.3* 2.0 2.2   GFR: Estimated Creatinine Clearance: 91.1 mL/min (by C-G formula based on SCr of 0.83 mg/dL). Liver Function Tests:  Recent Labs Lab 05/24/17 0457 05/25/17 0520  AST 47* 40  ALT 22 22  ALKPHOS 75 76  BILITOT 1.8* 1.6*  PROT 5.9* 7.3  ALBUMIN 2.0* 2.3*   No results for input(s): LIPASE, AMYLASE in the last 168 hours. No results for input(s): AMMONIA in the last 168 hours. Coagulation Profile:  Recent Labs Lab 05/23/17 0408  INR 1.33   Cardiac Enzymes:  Recent Labs Lab 05/21/17 2100 05/22/17 0203 05/22/17 0508  TROPONINI 0.48* 0.51* 0.46*   BNP (last 3 results) No results for input(s): PROBNP in the last 8760 hours. HbA1C:  Recent Labs  05/25/17 0520  HGBA1C 7.1*   CBG: No results for input(s): GLUCAP in the last 168 hours. Lipid Profile: No results for input(s): CHOL, HDL, LDLCALC, TRIG, CHOLHDL, LDLDIRECT in the last 72 hours. Thyroid Function Tests: No results for input(s): TSH, T4TOTAL, FREET4, T3FREE, THYROIDAB in the last 72 hours. Anemia Panel: No results for input(s): VITAMINB12, FOLATE, FERRITIN, TIBC, IRON, RETICCTPCT in the last 72 hours. Urine analysis:    Component Value Date/Time   COLORURINE YELLOW 05/21/2017 2320   APPEARANCEUR CLOUDY (A) 05/21/2017 2320   LABSPEC 1.010 05/21/2017 2320   PHURINE 5.5 05/21/2017 2320   GLUCOSEU NEGATIVE 05/21/2017 2320   HGBUR SMALL (A) 05/21/2017 2320   BILIRUBINUR NEGATIVE 05/21/2017 2320   BILIRUBINUR moderate 10/20/2014 1654   KETONESUR NEGATIVE 05/21/2017 2320    PROTEINUR 30 (A) 05/21/2017 2320   UROBILINOGEN 2.0 10/20/2014 1654   NITRITE NEGATIVE 05/21/2017 2320   LEUKOCYTESUR SMALL (A) 05/21/2017 2320   Sepsis Labs: @LABRCNTIP (procalcitonin:4,lacticidven:4)  )No results found for this or any previous visit (from the past 240 hour(s)).       Radiology Studies: Reviewed personally by MD  Scheduled Meds: . apixaban  10 mg Oral BID  . cyclobenzaprine  7.5 mg Oral TID  . digoxin  0.125 mg Oral Daily  . guaiFENesin  600 mg Oral BID  . losartan  25 mg Oral BID  . omeprazole  20  mg Oral Daily  . pneumococcal 23 valent vaccine  0.5 mL Intramuscular Tomorrow-1000  . potassium chloride  20 mEq Oral BID  . sodium chloride flush  3 mL Intravenous Q12H  . spironolactone  25 mg Oral Daily   Continuous Infusions: . sodium chloride    . magnesium sulfate 1 - 4 g bolus IVPB Stopped (05/27/17 1129)  . meropenem (MERREM) IV Stopped (05/27/17 1511)     LOS: 6 days    Time spent: [place], MD [facility]   If 7PM-7AM, please contact night-coverage [email or web address] Password [facility] 05/27/2017, 5:24 PM

## 2017-05-27 NOTE — Progress Notes (Signed)
Received Post Cath, Right Radial SiteTR band of 9cc air from report. Right Femoral dressing CDI, no hematoma no swelling noted. Will monitor accordingly.

## 2017-05-27 NOTE — H&P (View-Only) (Signed)
Advanced Heart Failure Rounding Note   Subjective:    Remains somewhat dyspneic but improved. Remains on IV lasix. Out 5L. Weight down another 7 pounds.   No bleeding on heparin. Low back pain improved with flexeril.   Underwent CT chest this am (reviewed personally):  IMPRESSION: 1. Bilateral pulmonary emboli in the lower lobes and perhaps the right upper lobe. No heart strain identified. 2. Bibasilar pulmonary opacities. These opacities could represent pneumonia. Aspiration is possible. Pulmonary infarcts cannot be excluded given the pulmonary emboli. Recommend follow-up to resolution. 3. Coronary artery calcifications. 4. Mildly enlarged nodes and increased attenuation in the fat of the left axilla. This is a nonspecific finding. Recommend clinical correlation and short-term follow-up to resolution. 5. Shotty nodes in the mediastinum may be reactive given the findings in the lungs. 6. Small pleural effusions.  Small pericardial effusion.   Objective:   Weight Range:  Vital Signs:   Temp:  [98.4 F (36.9 C)-98.6 F (37 C)] 98.4 F (36.9 C) (08/19 0647) Pulse Rate:  [109-124] 110 (08/19 1040) Resp:  [18] 18 (08/19 0647) BP: (111-121)/(79-86) 121/79 (08/19 1040) SpO2:  [91 %-96 %] 91 % (08/19 1040) Weight:  [85.2 kg (187 lb 14.4 oz)] 85.2 kg (187 lb 14.4 oz) (08/19 0647) Last BM Date: 05/25/17  Weight change: Filed Weights   05/24/17 0446 05/25/17 0424 05/26/17 0647  Weight: 92.8 kg (204 lb 9.6 oz) 88.1 kg (194 lb 4.8 oz) 85.2 kg (187 lb 14.4 oz)    Intake/Output:   Intake/Output Summary (Last 24 hours) at 05/26/17 1358 Last data filed at 05/26/17 1058  Gross per 24 hour  Intake              366 ml  Output             4450 ml  Net            -4084 ml     Physical Exam: General: lying in bed. No resp difficulty. hoarse HEENT: normal Neck: supple. JVP 9-10 . Carotids 2+ bilat; no bruits. No lymphadenopathy or thryomegaly appreciated. Cor: PMI  laterally displaced. Tachy regular. +s3 Lungs: crackles at bases.  Abdomen: obese soft, nontender, nondistended. No hepatosplenomegaly. No bruits or masses. Good bowel sounds. Extremities: no cyanosis, clubbing, rash, 2+ edema Neuro: alert & orientedx3, cranial nerves grossly intact. moves all 4 extremities w/o difficulty. Affect pleasant  Telemetry: sinus tach 100-115 brief NSVT overnight Personally reviewed   Labs: Basic Metabolic Panel:  Recent Labs Lab 05/21/17 2100 05/22/17 0203 05/23/17 0408 05/24/17 0457 05/25/17 0520 05/26/17 0408  NA 134*  --  138 138 136 134*  K 4.3  --  3.5 3.7 3.4* 3.4*  CL 94*  --  93* 93* 90* 91*  CO2 25  --  28 26 31 30   GLUCOSE 166*  --  121* 124* 144* 191*  BUN 26*  --  19 19 13 20   CREATININE 1.23*  --  0.88 0.88 0.87 0.87  CALCIUM 8.6*  --  8.7* 8.2* 8.6* 8.2*  MG  --  1.3* 1.5* 1.2* 1.3* 2.0    Liver Function Tests:  Recent Labs Lab 05/24/17 0457 05/25/17 0520  AST 47* 40  ALT 22 22  ALKPHOS 75 76  BILITOT 1.8* 1.6*  PROT 5.9* 7.3  ALBUMIN 2.0* 2.3*   No results for input(s): LIPASE, AMYLASE in the last 168 hours. No results for input(s): AMMONIA in the last 168 hours.  CBC:  Recent Labs Lab 05/22/17  7510 05/23/17 0408 05/24/17 0457 05/25/17 0520 05/26/17 0408  WBC 23.6* 22.1* 18.1* 20.4* 25.3*  NEUTROABS  --  16.4*  --   --   --   HGB 12.9 12.9 12.8 13.6 12.4  HCT 42.2 41.5 42.0 42.9 39.4  MCV 88.1 88.1 87.3 86.0 85.3  PLT 510* 478* 473* 604* 570*    Cardiac Enzymes:  Recent Labs Lab 05/21/17 2100 05/22/17 0203 05/22/17 0508  TROPONINI 0.48* 0.51* 0.46*    BNP: BNP (last 3 results)  Recent Labs  05/21/17 2100  BNP 2,921.6*    ProBNP (last 3 results) No results for input(s): PROBNP in the last 8760 hours.    Other results:  Imaging: Ct Chest W Contrast  Addendum Date: 05/26/2017   ADDENDUM REPORT: 05/26/2017 10:25 ADDENDUM: The findings were called to Dr. Verlon Au. The patient had an  ultrasound yesterday which is not available for my review. After discussing with the patient's referring physician, I suspect DVT in the right jugular vein which is expanded and unopacified. I also suspect the increased attenuation of fat in the left axilla, which in retrospect extends along the left subclavian vein, is likely secondary to a left subclavian DVT. This could also explain the mild adenopathy in the left axilla. No anatomic causes for the DVTs is identified. Electronically Signed   By: Dorise Bullion III M.D   On: 05/26/2017 10:25   Result Date: 05/26/2017 CLINICAL DATA:  Concern for pneumonia. EXAM: CT CHEST WITH CONTRAST TECHNIQUE: Multidetector CT imaging of the chest was performed during intravenous contrast administration. CONTRAST:  43mL ISOVUE-300 IOPAMIDOL (ISOVUE-300) INJECTION 61% COMPARISON:  Multiple chest x-rays since May 21, 2017 FINDINGS: Cardiovascular: Coronary artery calcifications are identified. Cardiomegaly. The thoracic aorta is unremarkable. This is not a study tailored to evaluate for pulmonary emboli. However, there are emboli located in the lower lobe pulmonary arteries. Evaluation of the proximal right upper lobe pulmonary artery is not well assessed due to streak artifact off the SVC but an embolus is not excluded. No emboli in the main pulmonary arteries. The left ventricle demonstrates a diameter of 7.3 cm and the right ventricle demonstrates a diameter of 3.9 cm, with a ratio of well less than 0.9. No evidence of heart strain. Mediastinum/Nodes: Prominent nodes are seen in the left axilla, asymmetric to the right with a E representative node on series 5, image 41 measuring 12 mm. There is mild increased attenuation of fat of the left axilla is while. Shotty nodes are seen in the mediastinum without overt adenopathy. Tiny effusions are seen in the pleural spaces. A small pericardial effusion is noted posteriorly. The esophagus is normal. The thyroid is unremarkable.  Lungs/Pleura: The central airways are normal. No pneumothorax. Bibasilar infiltrates are noted in in medial aspect of the right lower lobe and more diffusely in the left lower lobe. No other abnormalities are seen within the lungs. Upper Abdomen: Cholelithiasis. No other abnormalities in the upper abdomen. Musculoskeletal: No chest wall abnormality. No acute or significant osseous findings. IMPRESSION: 1. Bilateral pulmonary emboli in the lower lobes and perhaps the right upper lobe. No heart strain identified. 2. Bibasilar pulmonary opacities. These opacities could represent pneumonia. Aspiration is possible. Pulmonary infarcts cannot be excluded given the pulmonary emboli. Recommend follow-up to resolution. 3. Coronary artery calcifications. 4. Mildly enlarged nodes and increased attenuation in the fat of the left axilla. This is a nonspecific finding. Recommend clinical correlation and short-term follow-up to resolution. 5. Shotty nodes in the mediastinum may be reactive  given the findings in the lungs. 6. Small pleural effusions.  Small pericardial effusion. Electronically Signed: By: Dorise Bullion III M.D On: 05/26/2017 10:08   Dg Chest Port 1 View  Result Date: 05/26/2017 CLINICAL DATA:  Aspiration into airway EXAM: PORTABLE CHEST 1 VIEW COMPARISON:  Three days ago FINDINGS: Left lower lobe consolidation is unchanged. No visible pleural fluid. Mildly low lung volumes. Stable generous heart size. IMPRESSION: Unchanged left lower lobe pneumonia. Electronically Signed   By: Monte Fantasia M.D.   On: 05/26/2017 07:29      Medications:     Scheduled Medications: . cyclobenzaprine  7.5 mg Oral TID  . digoxin  0.125 mg Oral Daily  . furosemide  80 mg Intravenous Q8H  . losartan  12.5 mg Oral BID  . omeprazole  20 mg Oral Daily  . pneumococcal 23 valent vaccine  0.5 mL Intramuscular Tomorrow-1000  . potassium chloride  20 mEq Oral BID  . spironolactone  25 mg Oral Daily     Infusions: .  heparin 1,750 Units/hr (05/25/17 2017)  . magnesium sulfate 1 - 4 g bolus IVPB 2 g (05/26/17 1053)  . meropenem (MERREM) IV       PRN Medications:  acetaminophen **OR** acetaminophen, bisacodyl, gi cocktail, LORazepam, ondansetron **OR** ondansetron (ZOFRAN) IV, senna-docusate   Assessment:   51 y/o smoke admitted with acute systolic HF with biventricular dysfunction EF 15-20%. Also note to have markedly positive VQ suggestive of PE.     Plan/Discussion:     1. Acute systolic HF with biventricular dysfunction - EF 15-20% by echo with severe RV dysfunction - improving somewhat but still with persistent volume overload and prominent sinus tach - continue digoxin and spiro and losartan. Increase losartan to 25 bid No b-blocker yet - continue diuresis - cut lasix back to 80iv bid - place TED hose - Plan R/L heart cath tomorrow (R radial and R groin due to RIJ and subclavian DVT)  2. Acute RIJ/sublcavian DVT with bilateral PE  - VQ with bilateral perfusion defects  - CT chest (reviewed personally) with bilateral PE but no clot in main PAs. No RV strain - continue heparin. Level 0.56 today.  - mother had multiple clots as well. Hypercoag panel ordered for am. Will ask Dr. Beryle Beams to weigh in as well.   3. Tobacco use -now quit  4. NSVT - keep K> 4.0 Mg > 2.0  5. Bilateral pulmonary infiltrates.  - treating for PNA. On meropenem per primary team - PCT only 0.38 - Check ANA, ANCA, RF    Long discussion with patient and family about CT findings and cath.   Total time spent 35 minutes. Over half that time spent discussing above.    Length of Stay: 5   Emie Sommerfeld MD 05/26/2017, 1:58 PM  Advanced Heart Failure Team Pager [phone] (M-F; [place])  Please contact [facility] for night-coverage after hours (4p -7a ) and weekends on [email or web address]

## 2017-05-28 ENCOUNTER — Encounter (HOSPITAL_COMMUNITY): Payer: Self-pay | Admitting: General Surgery

## 2017-05-28 ENCOUNTER — Inpatient Hospital Stay (HOSPITAL_COMMUNITY): Payer: Medicaid Other

## 2017-05-28 DIAGNOSIS — I82409 Acute embolism and thrombosis of unspecified deep veins of unspecified lower extremity: Secondary | ICD-10-CM

## 2017-05-28 DIAGNOSIS — M7989 Other specified soft tissue disorders: Secondary | ICD-10-CM

## 2017-05-28 LAB — CBC WITH DIFFERENTIAL/PLATELET
Basophils Absolute: 0 10*3/uL (ref 0.0–0.1)
Basophils Relative: 0 %
EOS PCT: 2 %
Eosinophils Absolute: 0.3 10*3/uL (ref 0.0–0.7)
HEMATOCRIT: 39.8 % (ref 36.0–46.0)
HEMOGLOBIN: 12.3 g/dL (ref 12.0–15.0)
LYMPHS PCT: 20 %
Lymphs Abs: 3.1 10*3/uL (ref 0.7–4.0)
MCH: 27.2 pg (ref 26.0–34.0)
MCHC: 30.9 g/dL (ref 30.0–36.0)
MCV: 87.9 fL (ref 78.0–100.0)
Monocytes Absolute: 0.9 10*3/uL (ref 0.1–1.0)
Monocytes Relative: 6 %
Neutro Abs: 11.2 10*3/uL — ABNORMAL HIGH (ref 1.7–7.7)
Neutrophils Relative %: 72 %
Platelets: 553 10*3/uL — ABNORMAL HIGH (ref 150–400)
RBC: 4.53 MIL/uL (ref 3.87–5.11)
RDW: 16.6 % — AB (ref 11.5–15.5)
WBC: 15.5 10*3/uL — AB (ref 4.0–10.5)

## 2017-05-28 LAB — COMPREHENSIVE METABOLIC PANEL
ALT: 17 U/L (ref 14–54)
ANION GAP: 6 (ref 5–15)
AST: 33 U/L (ref 15–41)
Albumin: 2.1 g/dL — ABNORMAL LOW (ref 3.5–5.0)
Alkaline Phosphatase: 67 U/L (ref 38–126)
BILIRUBIN TOTAL: 1 mg/dL (ref 0.3–1.2)
BUN: 13 mg/dL (ref 6–20)
CALCIUM: 8.5 mg/dL — AB (ref 8.9–10.3)
CO2: 27 mmol/L (ref 22–32)
Chloride: 101 mmol/L (ref 101–111)
Creatinine, Ser: 0.67 mg/dL (ref 0.44–1.00)
GFR calc Af Amer: >60 mL/min (ref >60)
Glucose, Bld: 157 mg/dL — ABNORMAL HIGH (ref 65–99)
POTASSIUM: 5.1 mmol/L (ref 3.5–5.1)
Sodium: 134 mmol/L — ABNORMAL LOW (ref 135–145)
TOTAL PROTEIN: 6.5 g/dL (ref 6.5–8.1)

## 2017-05-28 LAB — ANCA TITERS

## 2017-05-28 LAB — RHEUMATOID FACTOR: RHEUMATOID FACTOR: 12.7 [IU]/mL (ref 0.0–13.9)

## 2017-05-28 LAB — MAGNESIUM: Magnesium: 2.2 mg/dL (ref 1.7–2.4)

## 2017-05-28 LAB — APTT: aPTT: 60 seconds — ABNORMAL HIGH (ref 24–36)

## 2017-05-28 LAB — HOMOCYSTEINE: Homocysteine: 14.8 umol/L (ref 0.0–15.0)

## 2017-05-28 LAB — CK: Total CK: 61 U/L (ref 38–234)

## 2017-05-28 LAB — HEPARIN LEVEL (UNFRACTIONATED): HEPARIN UNFRACTIONATED: 0.66 [IU]/mL (ref 0.30–0.70)

## 2017-05-28 LAB — ANA W/REFLEX IF POSITIVE: ANA: NEGATIVE

## 2017-05-28 MED ORDER — SALINE SPRAY 0.65 % NA SOLN
2.0000 | NASAL | Status: DC | PRN
  Administered 2017-05-29: 2 via NASAL
  Filled 2017-05-28: qty 44

## 2017-05-28 MED ORDER — FUROSEMIDE 40 MG PO TABS
40.0000 mg | ORAL_TABLET | Freq: Every day | ORAL | Status: DC
  Administered 2017-05-28 – 2017-05-30 (×3): 40 mg via ORAL
  Filled 2017-05-28 (×3): qty 1

## 2017-05-28 MED ORDER — AYR SALINE NASAL NA GEL
1.0000 "application " | Freq: Two times a day (BID) | NASAL | Status: DC
  Filled 2017-05-28: qty 14.1

## 2017-05-28 MED ORDER — HEPARIN (PORCINE) IN NACL 100-0.45 UNIT/ML-% IJ SOLN
1850.0000 [IU]/h | INTRAMUSCULAR | Status: DC
  Administered 2017-05-28: 1750 [IU]/h via INTRAVENOUS
  Administered 2017-05-29 – 2017-05-30 (×2): 1850 [IU]/h via INTRAVENOUS
  Filled 2017-05-28 (×4): qty 250

## 2017-05-28 MED ORDER — SACUBITRIL-VALSARTAN 24-26 MG PO TABS
1.0000 | ORAL_TABLET | Freq: Two times a day (BID) | ORAL | Status: DC
  Administered 2017-05-28 – 2017-05-30 (×5): 1 via ORAL
  Filled 2017-05-28 (×5): qty 1

## 2017-05-28 MED ORDER — IOPAMIDOL (ISOVUE-300) INJECTION 61%
INTRAVENOUS | Status: AC
  Administered 2017-05-28: 75 mL
  Filled 2017-05-28: qty 75

## 2017-05-28 NOTE — Progress Notes (Signed)
ANTICOAGULATION CONSULT NOTE - Follow Up Consult  Pharmacy Consult for Heparin  Indication: pulmonary embolus  Allergies  Allergen Reactions  . Unasyn [Ampicillin-Sulbactam Sodium] Other (See Comments)    Pt gets hot/flushed with infusion     Patient Measurements: Height: 5\' 8"  (172.7 cm) Weight: 190 lb 4.8 oz (86.3 kg) (scale c) IBW/kg (Calculated) : 63.9 kg Heparin Dosing Weight: 85 kg  Vital Signs: Temp: 97.7 F (36.5 C) (08/21 1236) Temp Source: Oral (08/21 1236) BP: 128/89 (08/21 1236) Pulse Rate: 110 (08/21 1236)  Labs:  Recent Labs  05/26/17 0408 05/26/17 1357 05/27/17 0255 05/27/17 0307 05/28/17 0509 05/28/17 1855  HGB 12.4  --  12.9  --  12.3  --   HCT 39.4  --  41.2  --  39.8  --   PLT 570*  --  577*  --  553*  --   APTT  --   --   --   --   --  60*  HEPARINUNFRC 0.69 0.56  --  0.39  --  0.66  CREATININE 0.87  --  0.83  --  0.67  --   CKTOTAL  --   --   --   --  61  --     Estimated Creatinine Clearance: 94.7 mL/min (by C-G formula based on SCr of 0.67 mg/dL).  Assessment: Heparin gtt for new onset bilateral PE found on CT from 8/19. Levels had been within therapeutic range. Was transitioned to apixaban following cath yesterday at 10 mg. Received one dose last night; however, plan for potential biopsy so transitioning back to heparin infusion this morning at previous rate.   Initial aPTT slightly subtherapeutic on heparin infusion at 1750 units/hr. No issues with infusion or sxs of bleeding.   Goal of Therapy:  Heparin level 0.3-0.7 units/ml Monitor platelets by anticoagulation protocol: Yes   Plan:  1. Increase heparin gtt to 1850 units/hr 2. Redraw aPTT and heparin level with am labs  3. Monitor for s/sx of bleeding   Vincenza Hews, PharmD, BCPS 05/28/2017, 7:53 PM

## 2017-05-28 NOTE — Consult Note (Signed)
ENT CONSULT:  Reason for Consult: Hoarseness Referring Physician: [facility]  Kylie Bennett is an 51 y.o. female.  HPI: Admitted for symptoms of shortness of breath. She had no significant prior medical history, 1 pack per day smoker until discontinuation one month ago. She developed symptoms of acute bronchitis with cough and was treated with amoxicillin through urgent care. Patient seen in emergency department with shortness of breath and cardiomyopathy, significant findings of pulmonary emboli, DVT involving the subclavian system on the left and right jugular vein complex. Questions raised regarding possible occult malignancy. Patient denies sore throat, dysphagia or odynophagia. She has severe gastroesophageal reflux with frequent symptoms of pyrosis and indigestion. She has chronic low-grade hoarseness. She reports dry nasal crusting and some bloody nasal discharge with recent nasal oxygen.  Past Medical History:  Diagnosis Date  . GERD (gastroesophageal reflux disease)   . Seasonal allergies     Past Surgical History:  Procedure Laterality Date  . RIGHT/LEFT HEART CATH AND CORONARY ANGIOGRAPHY N/A 05/27/2017   Procedure: RIGHT/LEFT HEART CATH AND CORONARY ANGIOGRAPHY;  Surgeon: Jolaine Artist, MD;  Location: [facility];  Service: Cardiovascular;  Laterality: N/A;    Family History  Problem Relation Age of Onset  . COPD Mother   . Rheum arthritis Mother   . Breast cancer Mother   . Hypertension Father   . Skin cancer Father   . Heart attack Father        age 56s  . Heart attack Paternal Grandfather   . Stroke Maternal Grandfather     Social History:  reports that she has quit smoking. Her smoking use included Cigarettes. She smoked 1.00 pack per day. She has never used smokeless tobacco. She reports that she drinks alcohol. She reports that she does not use drugs.  Allergies:  Allergies  Allergen Reactions  . Unasyn [Ampicillin-Sulbactam Sodium] Other (See  Comments)    Pt gets hot/flushed with infusion     Medications: I have reviewed the patient's current medications.  Results for orders placed or performed during the hospital encounter of 05/21/17 (from the past 48 hour(s))  Heparin level (unfractionated)     Status: None   Collection Time: 05/26/17  1:57 PM  Result Value Ref Range   Heparin Unfractionated 0.56 0.30 - 0.70 IU/mL    Comment:        IF HEPARIN RESULTS ARE BELOW EXPECTED VALUES, AND PATIENT DOSAGE HAS BEEN CONFIRMED, SUGGEST FOLLOW UP TESTING OF ANTITHROMBIN III LEVELS.   CBC     Status: Abnormal   Collection Time: 05/27/17  2:55 AM  Result Value Ref Range   WBC 17.9 (H) 4.0 - 10.5 K/uL   RBC 4.77 3.87 - 5.11 MIL/uL   Hemoglobin 12.9 12.0 - 15.0 g/dL   HCT 41.2 36.0 - 46.0 %   MCV 86.4 78.0 - 100.0 fL   MCH 27.0 26.0 - 34.0 pg   MCHC 31.3 30.0 - 36.0 g/dL   RDW 16.2 (H) 11.5 - 15.5 %   Platelets 577 (H) 150 - 400 K/uL  Basic metabolic panel     Status: Abnormal   Collection Time: 05/27/17  2:55 AM  Result Value Ref Range   Sodium 135 135 - 145 mmol/L   Potassium 4.1 3.5 - 5.1 mmol/L    Comment: DELTA CHECK NOTED   Chloride 94 (L) 101 - 111 mmol/L   CO2 31 22 - 32 mmol/L   Glucose, Bld 165 (H) 65 - 99 mg/dL   BUN  17 6 - 20 mg/dL   Creatinine, Ser 0.83 0.44 - 1.00 mg/dL   Calcium 8.5 (L) 8.9 - 10.3 mg/dL   GFR calc non Af Amer >60 >60 mL/min   GFR calc Af Amer >60 >60 mL/min    Comment: (NOTE) The eGFR has been calculated using the CKD EPI equation. This calculation has not been validated in all clinical situations. eGFR's persistently <60 mL/min signify possible Chronic Kidney Disease.    Anion gap 10 5 - 15  Magnesium     Status: None   Collection Time: 05/27/17  2:55 AM  Result Value Ref Range   Magnesium 2.2 1.7 - 2.4 mg/dL  Rheumatoid factor     Status: None   Collection Time: 05/27/17  2:55 AM  Result Value Ref Range   Rhuematoid fact SerPl-aCnc 12.7 0.0 - 13.9 IU/mL    Comment:  (NOTE) Performed At: [facility] [place], [place] [id number] Lindon Romp MD HF:[id number]   Antithrombin III     Status: Abnormal   Collection Time: 05/27/17  2:55 AM  Result Value Ref Range   AntiThromb III Func 72 (L) 75 - 120 %  Heparin level (unfractionated)     Status: None   Collection Time: 05/27/17  3:07 AM  Result Value Ref Range   Heparin Unfractionated 0.39 0.30 - 0.70 IU/mL    Comment:        IF HEPARIN RESULTS ARE BELOW EXPECTED VALUES, AND PATIENT DOSAGE HAS BEEN CONFIRMED, SUGGEST FOLLOW UP TESTING OF ANTITHROMBIN III LEVELS.   I-STAT 3, arterial blood gas (G3+)     Status: Abnormal   Collection Time: 05/27/17  8:41 AM  Result Value Ref Range   pH, Arterial 7.512 (H) 7.350 - 7.450   pCO2 arterial 35.8 32.0 - 48.0 mmHg   pO2, Arterial 66.0 (L) 83.0 - 108.0 mmHg   Bicarbonate 28.6 (H) 20.0 - 28.0 mmol/L   TCO2 30 0 - 100 mmol/L   O2 Saturation 95.0 %   Acid-Base Excess 6.0 (H) 0.0 - 2.0 mmol/L   Patient temperature HIDE    Sample type ARTERIAL   I-STAT 3, venous blood gas (G3P V)     Status: Abnormal   Collection Time: 05/27/17  8:47 AM  Result Value Ref Range   pH, Ven 7.509 (H) 7.250 - 7.430   pCO2, Ven 41.5 (L) 44.0 - 60.0 mmHg   pO2, Ven 32.0 32.0 - 45.0 mmHg   Bicarbonate 33.0 (H) 20.0 - 28.0 mmol/L   TCO2 34 0 - 100 mmol/L   O2 Saturation 68.0 %   Acid-Base Excess 9.0 (H) 0.0 - 2.0 mmol/L   Patient temperature HIDE    Sample type VENOUS   I-STAT 3, venous blood gas (G3P V)     Status: Abnormal   Collection Time: 05/27/17  8:48 AM  Result Value Ref Range   pH, Ven 7.492 (H) 7.250 - 7.430   pCO2, Ven 41.1 (L) 44.0 - 60.0 mmHg   pO2, Ven 31.0 (LL) 32.0 - 45.0 mmHg   Bicarbonate 31.4 (H) 20.0 - 28.0 mmol/L   TCO2 33 0 - 100 mmol/L   O2 Saturation 65.0 %   Acid-Base Excess 7.0 (H) 0.0 - 2.0 mmol/L   Patient temperature HIDE    Sample type VENOUS   POCT Activated clotting time     Status: None   Collection Time:  05/27/17  9:00 AM  Result Value Ref Range   Activated Clotting Time 164 seconds  Save smear     Status: None   Collection Time: 05/27/17  7:48 PM  Result Value Ref Range   Smear Review SMEAR STAINED AND AVAILABLE FOR REVIEW   Magnesium     Status: None   Collection Time: 05/28/17  5:09 AM  Result Value Ref Range   Magnesium 2.2 1.7 - 2.4 mg/dL  Comprehensive metabolic panel     Status: Abnormal   Collection Time: 05/28/17  5:09 AM  Result Value Ref Range   Sodium 134 (L) 135 - 145 mmol/L   Potassium 5.1 3.5 - 5.1 mmol/L    Comment: DELTA CHECK NOTED   Chloride 101 101 - 111 mmol/L   CO2 27 22 - 32 mmol/L   Glucose, Bld 157 (H) 65 - 99 mg/dL   BUN 13 6 - 20 mg/dL   Creatinine, Ser 0.67 0.44 - 1.00 mg/dL   Calcium 8.5 (L) 8.9 - 10.3 mg/dL   Total Protein 6.5 6.5 - 8.1 g/dL   Albumin 2.1 (L) 3.5 - 5.0 g/dL   AST 33 15 - 41 U/L   ALT 17 14 - 54 U/L   Alkaline Phosphatase 67 38 - 126 U/L   Total Bilirubin 1.0 0.3 - 1.2 mg/dL   GFR calc non Af Amer >60 >60 mL/min   GFR calc Af Amer >60 >60 mL/min    Comment: (NOTE) The eGFR has been calculated using the CKD EPI equation. This calculation has not been validated in all clinical situations. eGFR's persistently <60 mL/min signify possible Chronic Kidney Disease.    Anion gap 6 5 - 15  CBC with Differential     Status: Abnormal   Collection Time: 05/28/17  5:09 AM  Result Value Ref Range   WBC 15.5 (H) 4.0 - 10.5 K/uL    Comment: REPEATED TO VERIFY WHITE COUNT CONFIRMED ON SMEAR    RBC 4.53 3.87 - 5.11 MIL/uL   Hemoglobin 12.3 12.0 - 15.0 g/dL   HCT 39.8 36.0 - 46.0 %   MCV 87.9 78.0 - 100.0 fL   MCH 27.2 26.0 - 34.0 pg   MCHC 30.9 30.0 - 36.0 g/dL   RDW 16.6 (H) 11.5 - 15.5 %   Platelets 553 (H) 150 - 400 K/uL   Neutrophils Relative % 72 %   Neutro Abs 11.2 (H) 1.7 - 7.7 K/uL    Comment: CORRECTED ON 08/21 AT 4696: PREVIOUSLY REPORTED AS 10.9   Lymphocytes Relative 20 %   Lymphs Abs 3.1 0.7 - 4.0 K/uL    Comment:  CORRECTED ON 08/21 AT 2952: PREVIOUSLY REPORTED AS 3.0   Monocytes Relative 6 %   Monocytes Absolute 0.9 0.1 - 1.0 K/uL   Eosinophils Relative 2 %   Eosinophils Absolute 0.3 0.0 - 0.7 K/uL   Basophils Relative 0 %   Basophils Absolute 0.0 0.0 - 0.1 K/uL   RBC Morphology TEARDROP CELLS   CK     Status: None   Collection Time: 05/28/17  5:09 AM  Result Value Ref Range   Total CK 61 38 - 234 U/L    Ct Soft Tissue Neck W Contrast  Result Date: 05/28/2017 CLINICAL DATA:  51 year old female with bilateral pulmonary emboli. Query jugular DVT. EXAM: CT NECK WITH CONTRAST TECHNIQUE: Multidetector CT imaging of the neck was performed using the standard protocol following the bolus administration of intravenous contrast. CONTRAST:  47m ISOVUE-300 IOPAMIDOL (ISOVUE-300) INJECTION 61% COMPARISON:  Chest CTA 05/26/2017. FINDINGS: Pharynx and larynx: Laryngeal soft tissue contours are within normal  limits. There is benign appearing asymmetry of the right laryngeal cartilage on series 3, image 73. This might be developmental or posttraumatic. Pharyngeal soft tissue contours are within normal limits. No parapharyngeal or retropharyngeal space abnormality or inflammation. Salivary glands: Sublingual space, submandibular glands and parotid glands are within normal limits. Thyroid: Thyroid parenchyma appears normal. Lymph nodes: Abnormal indistinct soft tissue at the left thoracic inlet best seen on coronal series 7, image 71 encompasses 21 x 26 x 27 mm (AP by transverse by CC) is associated with nearby a venous thrombosis (see below) and indistinct left level 4 lymph and right peritracheal node enlargement (individual nodes up to 13 mm short axis). The bilateral level 1 through level 3 lymph node stations are within normal limits. Vascular: No inflammatory changes about the right carotid space, but there is thrombosis of the entire right internal jugular vein (coronal image 71). There is associated thrombus within the  right common facial vein (series 3, image 61), the right IJ bulb, and the intracranial right sigmoid and transverse venous sinuses (series 8, image 43). Nonocclusive thrombus within the right IJ extends to the right subclavian vein confluence and right innominate vein (series 3, image 111). The right subclavian vein otherwise appears normal. Furthermore, there is patchy thrombus throughout the left subclavian vein associated with the abnormal left thoracic inlet soft tissue (coronal image 71). The left IJ and left sigmoid and transverse sinuses are patent. Major arterial structures in the neck and at the skullbase are patent. Limited intracranial: Negative. Visualized orbits: Negative. Mastoids and visualized paranasal sinuses: Clear except for right maxillary sinus mucous retention cysts. Skeleton: Poor posterior maxillary dentition on the right. Otherwise no acute or suspicious osseous lesion identified in the neck or at the skullbase. Upper chest: Partially visible increased left axillary and mediastinal lymph nodes. Lung apices are clear except for mild dependent ground-glass opacity on the right. Small left greater than right layering pleural effusions. IMPRESSION: 1. Positive for extensive venous thrombosis in the right head and neck: Right sigmoid dural sinus thrombosis, right IJ thrombosis, right facial vein thrombosis. Patchy thrombus also in the left subclavian vein associated with # 2. 2. Indistinct soft tissue mass at the left thoracic inlet up to 2.7 cm most resembles a conglomeration of abnormal lymph nodes. No other neck mass or lymphadenopathy. 3. Consider Lymphoma and metastatic lymphadenopathy, and note also the presence of abnormal left axillary lymph nodes on the recent chest CTA. Lemierre's syndrome (Fusobacterium necrophorum throat infection) was also considered but felt unlikely given the absence of any neck inflammatory changes outside of the left thoracic inlet. 4. Study discussed by  telephone with Dr. Nita Sells on 05/28/2017 at 0915 hours. Electronically Signed   By: Genevie Ann M.D.   On: 05/28/2017 09:24    ROS:ROS 12 systems reviewed and negative except as stated in HPI   Blood pressure 136/83, pulse (!) 114, temperature 99.5 F (37.5 C), temperature source Oral, resp. rate 18, height 5' 8"  (1.727 m), weight 86.3 kg (190 lb 4.8 oz), SpO2 99 %.  PHYSICAL EXAM: General appearance - alert, well appearing, and in no distress Nose - normal and patent, no erythema, discharge or polyps and dry bloody crusting bilaterally, no active bleeding. Mouth - mucous membranes moist, pharynx normal without lesions and Normal-appearing 2+ tonsils, no erythema or exudate. No evidence of mucosal lesion, ulcer or mass. Neck - supple, no significant adenopathy, mild diffuse right facial swelling, no palpable mass or abnormality.   Procedure Note: Flexible Laryngoscopy  Risks/benefits and possible complications were discussed in detail. Patient understands and agrees to proceed with procedure.   Procedure: 4 mm flexible laryngoscope inserted through the nasal passageway with minimal discomfort. Nasal cavity and nasopharynx patent without discharge, mass or polyp. Normal base of tongue and supraglottis, normal vocal cord mobility. Mild diffuse bilateral vocal cord edema without evidence of nodule, ulcer or tumor. Post glottic erythema consistent with gastroesophageal reflux. Small amount of bloody discharge from the nasopharynx in the post glottic region. Normal hypopharynx without evidence of mass or aspiration.  Patient tolerated procedure without complication or difficulty.  Early Chars. Wilburn Cornelia, M.D.  Studies Reviewed: CT scan of neck as above.  Assessment/Plan: The patient presents for evaluation of shortness of breath with cardiomyopathy and pulmonary emboli, she has significant venous clotting involving the left subclavian and right jugular systems. No evidence of mucosal  abnormality, mass or tumor on flexible laryngoscopy performed at the bedside. She does have vocal cord changes which are consistent with her chronic smoking and cough history, benign vocal cord edema. No further workup or treatment required. Also findings of post glottic erythema consistent with active reflux. Recommend PPI therapy and aggressive reflux precautions. Findings on neck CT showed extensive DVT involving primarily the right jugular vein system. There is also question of diffuse inflammatory changes in the thoracic inlet posterior to the clavicular head, etiology and significance unclear but this may benefit from ultrasound-guided needle biopsy. No other anatomic finding or concerned. The patient has some blood in the nasal cavity nasopharynx and hypopharynx which appears to be coming primarily from the nasal passageway. With her current anticoagulation therapy would recommend epistaxis precautions and frequent use of nasal saline spray to reduce crusting and dryness. Monitor symptoms, plan follow-up or reconsult as needed.  [place], Surya Schroeter 05/28/2017, 12:18 PM

## 2017-05-28 NOTE — Progress Notes (Signed)
Attempted to get patient for UE venous duplex at 10:00. Doctor entering room and will be a while. Will check back as time permits.  Landry Mellow, RDMS, RVT  Vascular lab

## 2017-05-28 NOTE — Progress Notes (Signed)
Pt on heparin drip @ 17.5. No bleeding noted, will continue to monitor.

## 2017-05-28 NOTE — Progress Notes (Signed)
ANTICOAGULATION CONSULT NOTE - Follow Up Consult  Pharmacy Consult for Heparin  Indication: pulmonary embolus  Allergies  Allergen Reactions  . Unasyn [Ampicillin-Sulbactam Sodium] Other (See Comments)    Pt gets hot/flushed with infusion     Patient Measurements: Height: 5\' 8"  (172.7 cm) Weight: 190 lb 4.8 oz (86.3 kg) (scale c) IBW/kg (Calculated) : 63.9 kg Heparin Dosing Weight: 85 kg  Vital Signs: Temp: 99.5 F (37.5 C) (08/21 0508) Temp Source: Oral (08/21 0508) BP: 136/83 (08/21 0508) Pulse Rate: 114 (08/21 0949)  Labs:  Recent Labs  05/26/17 0408 05/26/17 1357 05/27/17 0255 05/27/17 0307 05/28/17 0509  HGB 12.4  --  12.9  --  12.3  HCT 39.4  --  41.2  --  39.8  PLT 570*  --  577*  --  553*  HEPARINUNFRC 0.69 0.56  --  0.39  --   CREATININE 0.87  --  0.83  --  0.67  CKTOTAL  --   --   --   --  61    Estimated Creatinine Clearance: 94.7 mL/min (by C-G formula based on SCr of 0.67 mg/dL).  Assessment: Heparin gtt for new onset bilateral PE found on CT from 8/19. Levels had been within therapeutic range. Was transitioned to apixaban following cath yesterday at 10 mg. Received one dose last night; however, plan for potential biopsy so transitioning back to heparin infusion this morning at previous rate.   Hgb and PLTc stable. No notes of any signs or symptoms of bleeding in chart.   Goal of Therapy:  Heparin level 0.3-0.7 units/ml Monitor platelets by anticoagulation protocol: Yes   Plan:  1. Start heparin gtt at 1750 units/hr this morning 2. Obtain heparin level and aPTT 8 hours later due to receiving a dose of apixaban last night 3. Monitor for s/sx of bleeding  Doylene Canard, PharmD Clinical Pharmacist  Pager: [phone] 05/28/2017, 9:58 AM

## 2017-05-28 NOTE — Progress Notes (Signed)
*  PRELIMINARY RESULTS* Vascular Ultrasound Left upper extremity venous duplex has been completed.  Preliminary findings: DVT noted in the left subclavian and axillary veins.   Landry Mellow, RDMS, RVT  05/28/2017, 11:31 AM

## 2017-05-28 NOTE — Progress Notes (Signed)
PROGRESS NOTE    Kylie Bennett  WNI:[id number] DOB: Apr 18, 1966 DOA: 05/21/2017 PCP: Patient, No Pcp Per  Outpatient Specialists:     Brief Narrative:   26 white ? Recent smoker until 04/2017 Note that patient declined labs or chest x-ray at that visit Hypertension  For the past 5 weeks patient has been feeling poorly since quitting smoking She developed bronchitic-like symptoms Recently seen at no vomiting treated for bronchitis? Antibiotics-Augmentin Has had multiple short trips out of town, none specifically > 4 hours duration             Documentation notes lower leg edema shortness of breath with activity and yellow-green sputum   Assessment & Plan:   Principal Problem:   Acute CHF (congestive heart failure) ([place]) Active Problems:   Hyponatremia   Community acquired pneumonia of right lower lobe of lung ([place])   Acute systolic heart failure (HCC)   Bilateral pulmonary embolism (HCC)   Axillary adenopathy   Hoarseness of voice   Nonischemic cardiomyopathy (HCC)   Acute systolic + diastolic CHF Ef 38%--? NICM of unknown etiology--Coronaries are clean on RHC/LHC 1/82 Grade 3 Diastolic parameters +  Lasix 40 ii/day-->80ii/day--->80iii/day-now PO lasix qd  Aldactone 25 started 8/15  Digoxin 0.125 started 8/15--- continue same dose  Creat stable  I/o so far = - 20 liters so far--weight 212-->204-->187-->190  Watch K--might need to cut back dose of Aldactone   D Dimer 19 ?malignancy-Breast favoured >Lymophoma [app dr. Jearld Pies and dr. Wilburn Cornelia input]  AI and Heme work-up initiated 8/20--follow results + PE on V/Q scan-Recent long travels, smoker-not on estrogen replacement CTA 8/19 as below ? Aspiration vs pulm infacts from PE--prior on Augmentin PTA  Continue  IV heparin for pulmonary embolism -->changing to Elliquis 8/20  r + L sided subclavian and internal jugular DVT concerning for possible malignancy--not seen on CTA 8/19  no heart strain-no role  TPA  Change Augmentin-->merrem 8/19, adding flutter, mucinex 600 bid--WBC 25-->17-->15 CT neck 8/21 noted--extensive clot all over head/neck vasculature-d/w Dr. 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              Needs outpatient counseling regarding the same   might benefit from bariatric referral versus structured weight loss program  Defer aggressive management of DM for right now--If sugars abive 200 initate SSI   Consultants:   Cardiology  Oncology  ENT  IR  Procedures:   VQ scan8/16 high probability PE  Cardiac cath 8/20: 1. Mild non obstructive CAD 2. Severe NICM with EF 15% by echo 3. Well-compensated hemodynamics   Antimicrobials:   Augmentin -->merrem   Subjective:  Sob improved looks and feels ok Taking news of possible Ca fair Walking around the halls and the unit some  Objective: Vitals:   05/27/17 2004 05/28/17 9937 05/28/17 0949 05/28/17  1236  BP: 121/77 136/83  128/89  Pulse: (!) 115 (!) 123 (!) 114 (!) 110  Resp: 18 18  18   Temp: 98.4 F (36.9 C) 99.5 F (37.5 C)  97.7 F (36.5 C)  TempSrc: Oral Oral  Oral  SpO2: 100% 99%  92%  Weight:  86.3 kg (190 lb 4.8 oz)    Height:        Intake/Output Summary (Last 24 hours) at 05/28/17 1436 Last data filed at 05/28/17 0500  Gross per 24 hour  Intake              700 ml  Output               300 ml  Net              400 ml   Filed Weights   05/26/17 0647 05/27/17 0544 05/28/17 0508  Weight: 85.2 kg (187 lb 14.4 oz) 86.2 kg (190 lb) 86.3 kg (190 lb 4.8 oz)    Examination:  Looks well eatin Sinus tach abd soft cta b--less rales grd 2 LE edema Neuro intact  Data Reviewed: I have personally reviewed following labs and imaging studies  CBC:  Recent Labs Lab 05/23/17 0408 05/24/17 0457 05/25/17 0520 05/26/17 0408 05/27/17 0255 05/28/17 0509  WBC 22.1* 18.1* 20.4* 25.3* 17.9* 15.5*  NEUTROABS 16.4*  --   --   --   --  11.2*  HGB 12.9 12.8 13.6 12.4 12.9 12.3  HCT 41.5 42.0 42.9 39.4 41.2 39.8  MCV 88.1 87.3 86.0 85.3 86.4 87.9  PLT 478* 473* 604* 570* 577* 235*   Basic Metabolic Panel:  Recent Labs Lab 05/24/17 0457 05/25/17 0520 05/26/17 0408 05/27/17 0255 05/28/17 0509  NA 138 136 134* 135 134*  K 3.7 3.4* 3.4* 4.1 5.1  CL 93* 90* 91* 94* 101  CO2 26 31 30 31 27   GLUCOSE 124* 144* 191* 165* 157*  BUN 19 13 20 17 13   CREATININE 0.88 0.87 0.87 0.83 0.67  CALCIUM 8.2* 8.6* 8.2* 8.5* 8.5*  MG 1.2* 1.3* 2.0 2.2 2.2   GFR: Estimated Creatinine Clearance: 94.7 mL/min (by C-G formula based on SCr of 0.67 mg/dL). Liver Function Tests:  Recent Labs Lab 05/24/17 0457 05/25/17 0520 05/28/17 0509  AST 47* 40 33  ALT 22 22 17   ALKPHOS 75 76 67  BILITOT 1.8* 1.6* 1.0  PROT 5.9* 7.3 6.5  ALBUMIN 2.0* 2.3* 2.1*   No results for input(s): LIPASE, AMYLASE in the last 168 hours. No results for input(s): AMMONIA in the last 168 hours. Coagulation Profile:  Recent Labs Lab 05/23/17 0408  INR 1.33   Cardiac Enzymes:  Recent Labs Lab 05/21/17 2100 05/22/17 0203 05/22/17 0508 05/28/17 0509  CKTOTAL  --   --   --  61  TROPONINI 0.48* 0.51* 0.46*  --    BNP (last 3 results) No results for input(s): PROBNP in the last 8760 hours. HbA1C: No results for input(s): HGBA1C in the last 72 hours. CBG: No results for input(s): GLUCAP in the last  168 hours. Lipid Profile: No results for input(s): CHOL, HDL, LDLCALC, TRIG, CHOLHDL, LDLDIRECT in the last 72 hours. Thyroid Function Tests: No results for input(s): TSH, T4TOTAL, FREET4, T3FREE, THYROIDAB in the last 72 hours. Anemia Panel: No results for input(s): VITAMINB12, FOLATE, FERRITIN, TIBC, IRON, RETICCTPCT in the last 72 hours. Urine analysis:    Component Value Date/Time   COLORURINE YELLOW 05/21/2017 2320   APPEARANCEUR CLOUDY (A) 05/21/2017  2320   LABSPEC 1.010 05/21/2017 2320   PHURINE 5.5 05/21/2017 2320   GLUCOSEU NEGATIVE 05/21/2017 2320   HGBUR SMALL (A) 05/21/2017 2320   BILIRUBINUR NEGATIVE 05/21/2017 2320   BILIRUBINUR moderate 10/20/2014 1654   KETONESUR NEGATIVE 05/21/2017 2320   PROTEINUR 30 (A) 05/21/2017 2320   UROBILINOGEN 2.0 10/20/2014 1654   NITRITE NEGATIVE 05/21/2017 2320   LEUKOCYTESUR SMALL (A) 05/21/2017 2320   Sepsis Labs: @LABRCNTIP (procalcitonin:4,lacticidven:4)  )No results found for this or any previous visit (from the past 240 hour(s)).       Radiology Studies: Reviewed personally by MD  Scheduled Meds: . cyclobenzaprine  7.5 mg Oral TID  . digoxin  0.125 mg Oral Daily  . furosemide  40 mg Oral Daily  . guaiFENesin  600 mg Oral BID  . omeprazole  20 mg Oral Daily  . pneumococcal 23 valent vaccine  0.5 mL Intramuscular Tomorrow-1000  . sacubitril-valsartan  1 tablet Oral BID  . sodium chloride flush  3 mL Intravenous Q12H  . spironolactone  25 mg Oral Daily   Continuous Infusions: . sodium chloride    . heparin 1,750 Units/hr (05/28/17 1220)  . magnesium sulfate 1 - 4 g bolus IVPB Stopped (05/28/17 1130)  . meropenem (MERREM) IV Stopped (05/28/17 0601)     LOS: 7 days    Time spent: [place], MD [facility]   If 7PM-7AM, please contact night-coverage [email or web address] Password [facility] 05/28/2017, 2:36 PM

## 2017-05-28 NOTE — Consult Note (Signed)
Chief Complaint: lymphadenopathy  Referring Physician:Dr. Verneita Griffes  Supervising Physician: Daryll Brod  Patient Status: [facility] - In-pt  HPI: Kylie Bennett is a 51 y.o. female who was doing a lot of traveling a little over a month ago. She was a smoke but stopped also about a month ago.  She noticed some swelling in her lower extremity, but had no pain.  She then developed some shortness of breath which has progressively worsened until last Tuesday she presented to the ED because of this.  She was found to have bilateral PEs along with thrombus in multiple other upper body vessels.  The cause of these are unknown.  A workup has been done and she was found to have lymphadenopathy in the left supraclavicular space.  We have been asked to biopsy this area to see if there is underlying malignancy.  Past Medical History:  Past Medical History:  Diagnosis Date  . GERD (gastroesophageal reflux disease)   . Seasonal allergies     Past Surgical History:  Past Surgical History:  Procedure Laterality Date  . RIGHT/LEFT HEART CATH AND CORONARY ANGIOGRAPHY N/A 05/27/2017   Procedure: RIGHT/LEFT HEART CATH AND CORONARY ANGIOGRAPHY;  Surgeon: Jolaine Artist, MD;  Location: [place] [facility];  Service: Cardiovascular;  Laterality: N/A;    Family History:  Family History  Problem Relation Age of Onset  . COPD Mother   . Rheum arthritis Mother   . Breast cancer Mother   . Hypertension Father   . Skin cancer Father   . Heart attack Father        age 55s  . Heart attack Paternal Grandfather   . Stroke Maternal Grandfather     Social History:  reports that she has quit smoking. Her smoking use included Cigarettes. She smoked 1.00 pack per day. She has never used smokeless tobacco. She reports that she drinks alcohol. She reports that she does not use drugs.  Allergies:  Allergies  Allergen Reactions  . Unasyn [Ampicillin-Sulbactam Sodium] Other (See Comments)    Pt gets hot/flushed  with infusion     Medications: Medications reviewed in epic  Please HPI for pertinent positives, otherwise complete 10 system ROS negative.  Mallampati Score: MD Evaluation Airway: WNL Heart: WNL Heart  comments: EF 15% Abdomen: WNL Chest/ Lungs: WNL Chest/ lungs comments: PE ASA  Classification: 2 Mallampati/Airway Score: Two  Physical Exam: BP 128/89 (BP Location: Right Arm)   Pulse (!) 110   Temp 97.7 F (36.5 C) (Oral)   Resp 18   Ht _0  (1.727 m)   Wt 190 lb 4.8 oz (86.3 kg) Comment: scale c  SpO2 92%   BMI 28.94 kg/m  Body mass index is 28.94 kg/m. General: pleasant, WD, WN white female who is laying in bed in NAD HEENT: head is normocephalic, atraumatic.  Sclera are noninjected.  PERRL.  Ears and nose without any masses or lesions.  Mouth is pink and moist Heart: regular, rate, and rhythm.  Normal s1,s2. No obvious murmurs, gallops, or rubs noted. Lungs: CTAB, no wheezes, rhonchi, or rales noted.  Respiratory effort nonlabored Abd: soft, NT, ND, +BS, no masses, hernias, or organomegaly Psych: A&Ox3 with an appropriate affect.   Labs: Results for orders placed or performed during the hospital encounter of 05/21/17 (from the past 48 hour(s))  CBC     Status: Abnormal   Collection Time: 05/27/17  2:55 AM  Result Value Ref Range   WBC 17.9 (H) 4.0 - 10.5  K/uL   RBC 4.77 3.87 - 5.11 MIL/uL   Hemoglobin 12.9 12.0 - 15.0 g/dL   HCT 41.2 36.0 - 46.0 %   MCV 86.4 78.0 - 100.0 fL   MCH 27.0 26.0 - 34.0 pg   MCHC 31.3 30.0 - 36.0 g/dL   RDW 16.2 (H) 11.5 - 15.5 %   Platelets 577 (H) 150 - 400 K/uL  Basic metabolic panel     Status: Abnormal   Collection Time: 05/27/17  2:55 AM  Result Value Ref Range   Sodium 135 135 - 145 mmol/L   Potassium 4.1 3.5 - 5.1 mmol/L    Comment: DELTA CHECK NOTED   Chloride 94 (L) 101 - 111 mmol/L   CO2 31 22 - 32 mmol/L   Glucose, Bld 165 (H) 65 - 99 mg/dL   BUN 17 6 - 20 mg/dL   Creatinine, Ser 0.83 0.44 - 1.00 mg/dL    Calcium 8.5 (L) 8.9 - 10.3 mg/dL   GFR calc non Af Amer >60 >60 mL/min   GFR calc Af Amer >60 >60 mL/min    Comment: (NOTE) The eGFR has been calculated using the CKD EPI equation. This calculation has not been validated in all clinical situations. eGFR's persistently <60 mL/min signify possible Chronic Kidney Disease.    Anion gap 10 5 - 15  Magnesium     Status: None   Collection Time: 05/27/17  2:55 AM  Result Value Ref Range   Magnesium 2.2 1.7 - 2.4 mg/dL  ANA w/Reflex if Positive     Status: None   Collection Time: 05/27/17  2:55 AM  Result Value Ref Range   Anit Nuclear Antibody(ANA) Negative Negative    Comment: (NOTE) Performed At: [facility] [place], [place] [id number] Lindon Romp MD [id number]   Rheumatoid factor     Status: None   Collection Time: 05/27/17  2:55 AM  Result Value Ref Range   Rhuematoid fact SerPl-aCnc 12.7 0.0 - 13.9 IU/mL    Comment: (NOTE) Performed At: [facility] [place], [place] [id number] Lindon Romp MD IR:[id number]   Antithrombin III     Status: Abnormal   Collection Time: 05/27/17  2:55 AM  Result Value Ref Range   AntiThromb III Func 72 (L) 75 - 120 %  Heparin level (unfractionated)     Status: None   Collection Time: 05/27/17  3:07 AM  Result Value Ref Range   Heparin Unfractionated 0.39 0.30 - 0.70 IU/mL    Comment:        IF HEPARIN RESULTS ARE BELOW EXPECTED VALUES, AND PATIENT DOSAGE HAS BEEN CONFIRMED, SUGGEST FOLLOW UP TESTING OF ANTITHROMBIN III LEVELS.   Homocysteine, serum     Status: None   Collection Time: 05/27/17  3:07 AM  Result Value Ref Range   Homocysteine 14.8 0.0 - 15.0 umol/L    Comment: (NOTE) Performed At: [facility] [place], [place] [id number] Lindon Romp MD [id number]   I-STAT 3, arterial blood gas (G3+)     Status: Abnormal   Collection Time: 05/27/17  8:41 AM  Result Value Ref Range   pH, Arterial  7.512 (H) 7.350 - 7.450   pCO2 arterial 35.8 32.0 - 48.0 mmHg   pO2, Arterial 66.0 (L) 83.0 - 108.0 mmHg   Bicarbonate 28.6 (H) 20.0 - 28.0 mmol/L   TCO2 30 0 - 100 mmol/L   O2 Saturation 95.0 %   Acid-Base Excess 6.0 (H) 0.0 -  2.0 mmol/L   Patient temperature HIDE    Sample type ARTERIAL   I-STAT 3, venous blood gas (G3P V)     Status: Abnormal   Collection Time: 05/27/17  8:47 AM  Result Value Ref Range   pH, Ven 7.509 (H) 7.250 - 7.430   pCO2, Ven 41.5 (L) 44.0 - 60.0 mmHg   pO2, Ven 32.0 32.0 - 45.0 mmHg   Bicarbonate 33.0 (H) 20.0 - 28.0 mmol/L   TCO2 34 0 - 100 mmol/L   O2 Saturation 68.0 %   Acid-Base Excess 9.0 (H) 0.0 - 2.0 mmol/L   Patient temperature HIDE    Sample type VENOUS   I-STAT 3, venous blood gas (G3P V)     Status: Abnormal   Collection Time: 05/27/17  8:48 AM  Result Value Ref Range   pH, Ven 7.492 (H) 7.250 - 7.430   pCO2, Ven 41.1 (L) 44.0 - 60.0 mmHg   pO2, Ven 31.0 (LL) 32.0 - 45.0 mmHg   Bicarbonate 31.4 (H) 20.0 - 28.0 mmol/L   TCO2 33 0 - 100 mmol/L   O2 Saturation 65.0 %   Acid-Base Excess 7.0 (H) 0.0 - 2.0 mmol/L   Patient temperature HIDE    Sample type VENOUS   POCT Activated clotting time     Status: None   Collection Time: 05/27/17  9:00 AM  Result Value Ref Range   Activated Clotting Time 164 seconds  Save smear     Status: None   Collection Time: 05/27/17  7:48 PM  Result Value Ref Range   Smear Review SMEAR STAINED AND AVAILABLE FOR REVIEW   Magnesium     Status: None   Collection Time: 05/28/17  5:09 AM  Result Value Ref Range   Magnesium 2.2 1.7 - 2.4 mg/dL  Comprehensive metabolic panel     Status: Abnormal   Collection Time: 05/28/17  5:09 AM  Result Value Ref Range   Sodium 134 (L) 135 - 145 mmol/L   Potassium 5.1 3.5 - 5.1 mmol/L    Comment: DELTA CHECK NOTED   Chloride 101 101 - 111 mmol/L   CO2 27 22 - 32 mmol/L   Glucose, Bld 157 (H) 65 - 99 mg/dL   BUN 13 6 - 20 mg/dL   Creatinine, Ser 0.67 0.44 - 1.00 mg/dL    Calcium 8.5 (L) 8.9 - 10.3 mg/dL   Total Protein 6.5 6.5 - 8.1 g/dL   Albumin 2.1 (L) 3.5 - 5.0 g/dL   AST 33 15 - 41 U/L   ALT 17 14 - 54 U/L   Alkaline Phosphatase 67 38 - 126 U/L   Total Bilirubin 1.0 0.3 - 1.2 mg/dL   GFR calc non Af Amer >60 >60 mL/min   GFR calc Af Amer >60 >60 mL/min    Comment: (NOTE) The eGFR has been calculated using the CKD EPI equation. This calculation has not been validated in all clinical situations. eGFR's persistently <60 mL/min signify possible Chronic Kidney Disease.    Anion gap 6 5 - 15  CBC with Differential     Status: Abnormal   Collection Time: 05/28/17  5:09 AM  Result Value Ref Range   WBC 15.5 (H) 4.0 - 10.5 K/uL    Comment: REPEATED TO VERIFY WHITE COUNT CONFIRMED ON SMEAR    RBC 4.53 3.87 - 5.11 MIL/uL   Hemoglobin 12.3 12.0 - 15.0 g/dL   HCT 39.8 36.0 - 46.0 %   MCV 87.9 78.0 - 100.0 fL  MCH 27.2 26.0 - 34.0 pg   MCHC 30.9 30.0 - 36.0 g/dL   RDW 16.6 (H) 11.5 - 15.5 %   Platelets 553 (H) 150 - 400 K/uL   Neutrophils Relative % 72 %   Neutro Abs 11.2 (H) 1.7 - 7.7 K/uL    Comment: CORRECTED ON 08/21 AT 8921: PREVIOUSLY REPORTED AS 10.9   Lymphocytes Relative 20 %   Lymphs Abs 3.1 0.7 - 4.0 K/uL    Comment: CORRECTED ON 08/21 AT 1941: PREVIOUSLY REPORTED AS 3.0   Monocytes Relative 6 %   Monocytes Absolute 0.9 0.1 - 1.0 K/uL   Eosinophils Relative 2 %   Eosinophils Absolute 0.3 0.0 - 0.7 K/uL   Basophils Relative 0 %   Basophils Absolute 0.0 0.0 - 0.1 K/uL   RBC Morphology TEARDROP CELLS   CK     Status: None   Collection Time: 05/28/17  5:09 AM  Result Value Ref Range   Total CK 61 38 - 234 U/L    Imaging: Ct Soft Tissue Neck W Contrast  Result Date: 05/28/2017 CLINICAL DATA:  51 year old female with bilateral pulmonary emboli. Query jugular DVT. EXAM: CT NECK WITH CONTRAST TECHNIQUE: Multidetector CT imaging of the neck was performed using the standard protocol following the bolus administration of intravenous  contrast. CONTRAST:  26m ISOVUE-300 IOPAMIDOL (ISOVUE-300) INJECTION 61% COMPARISON:  Chest CTA 05/26/2017. FINDINGS: Pharynx and larynx: Laryngeal soft tissue contours are within normal limits. There is benign appearing asymmetry of the right laryngeal cartilage on series 3, image 73. This might be developmental or posttraumatic. Pharyngeal soft tissue contours are within normal limits. No parapharyngeal or retropharyngeal space abnormality or inflammation. Salivary glands: Sublingual space, submandibular glands and parotid glands are within normal limits. Thyroid: Thyroid parenchyma appears normal. Lymph nodes: Abnormal indistinct soft tissue at the left thoracic inlet best seen on coronal series 7, image 71 encompasses 21 x 26 x 27 mm (AP by transverse by CC) is associated with nearby a venous thrombosis (see below) and indistinct left level 4 lymph and right peritracheal node enlargement (individual nodes up to 13 mm short axis). The bilateral level 1 through level 3 lymph node stations are within normal limits. Vascular: No inflammatory changes about the right carotid space, but there is thrombosis of the entire right internal jugular vein (coronal image 71). There is associated thrombus within the right common facial vein (series 3, image 61), the right IJ bulb, and the intracranial right sigmoid and transverse venous sinuses (series 8, image 43). Nonocclusive thrombus within the right IJ extends to the right subclavian vein confluence and right innominate vein (series 3, image 111). The right subclavian vein otherwise appears normal. Furthermore, there is patchy thrombus throughout the left subclavian vein associated with the abnormal left thoracic inlet soft tissue (coronal image 71). The left IJ and left sigmoid and transverse sinuses are patent. Major arterial structures in the neck and at the skullbase are patent. Limited intracranial: Negative. Visualized orbits: Negative. Mastoids and visualized  paranasal sinuses: Clear except for right maxillary sinus mucous retention cysts. Skeleton: Poor posterior maxillary dentition on the right. Otherwise no acute or suspicious osseous lesion identified in the neck or at the skullbase. Upper chest: Partially visible increased left axillary and mediastinal lymph nodes. Lung apices are clear except for mild dependent ground-glass opacity on the right. Small left greater than right layering pleural effusions. IMPRESSION: 1. Positive for extensive venous thrombosis in the right head and neck: Right sigmoid dural sinus thrombosis, right IJ  thrombosis, right facial vein thrombosis. Patchy thrombus also in the left subclavian vein associated with # 2. 2. Indistinct soft tissue mass at the left thoracic inlet up to 2.7 cm most resembles a conglomeration of abnormal lymph nodes. No other neck mass or lymphadenopathy. 3. Consider Lymphoma and metastatic lymphadenopathy, and note also the presence of abnormal left axillary lymph nodes on the recent chest CTA. Lemierre's syndrome (Fusobacterium necrophorum throat infection) was also considered but felt unlikely given the absence of any neck inflammatory changes outside of the left thoracic inlet. 4. Study discussed by telephone with Dr. Nita Sells on 05/28/2017 at 0915 hours. Electronically Signed   By: Genevie Ann M.D.   On: 05/28/2017 09:24    Assessment/Plan 1. Supraclavicular LAD  We will plan to try a LN biopsy on Thursday given her dose of eliquis last night.  We will stop her heparin drip prior to the procedure as well.  She will be NPO p MN on Wednesday night. Risks and benefits discussed with the patient including, but not limited to bleeding, infection, damage to adjacent structures or low yield requiring additional tests. All of the patient's questions were answered, patient is agreeable to proceed. Consent signed and in chart.  Thank you for this interesting consult.  I greatly enjoyed meeting Kylie Bennett and look forward to participating in their care.  A copy of this report was sent to the requesting provider on this date.  Electronically Signed: Henreitta Cea 05/28/2017, 3:19 PM   I spent a total of 40 Minutes    in face to face in clinical consultation, greater than 50% of which was counseling/coordinating care for supraclavicular lympadenopathy

## 2017-05-28 NOTE — Progress Notes (Signed)
Financial Counselor called to talk to the patient about any available resources that she may qualify for. Patient stated that she has Medicaid application but is too stressed right now to fill it out; CM will continue to follow pt; patient stated " I'm too stress right now to talk." B Pennie Rushing [phone]

## 2017-05-28 NOTE — Progress Notes (Signed)
Advanced Heart Failure Rounding Note   Subjective:    Underwent R/LHC 05/27/17 with mild non-obstructive CAD, severe NICM with EF 15% by Echo, and well compensated hemodynamics. Full report below.   Heparin switched to PE dose Apixaban last night.   Feeling much better. Negative 20 L overall, and down 30 lb from admission weight. She is self pay, and is currently un-employed, so is worried about the bill she is accruing. She denies lightheadedness or dizziness.   L/RHC 05/27/17  Mid RCA lesion, 20 %stenosed.  Dist LAD lesion, 40 %stenosed. RHC hemodynamics AO = 123/89 (104) LV=   124/13 RA =  7 RV = 42/7 PA = 45/20 (29) PCW = 11 Fick cardiac output/index = 5.4/2.7 PVR = 3.3 WU SVR 1448 FA sat = 95% PA sat = 65%, 68%  CT chest 05/27/17 (Personally reviewed)  IMPRESSION: 1. Bilateral pulmonary emboli in the lower lobes and perhaps the right upper lobe. No heart strain identified. 2. Bibasilar pulmonary opacities. These opacities could represent pneumonia. Aspiration is possible. Pulmonary infarcts cannot be excluded given the pulmonary emboli. Recommend follow-up to resolution. 3. Coronary artery calcifications. 4. Mildly enlarged nodes and increased attenuation in the fat of the left axilla. This is a nonspecific finding. Recommend clinical correlation and short-term follow-up to resolution. 5. Shotty nodes in the mediastinum may be reactive given the findings in the lungs. 6. Small pleural effusions.  Small pericardial effusion.   Objective:   Weight Range:  Vital Signs:   Temp:  [98.4 F (36.9 C)-99.5 F (37.5 C)] 99.5 F (37.5 C) (08/21 0508) Pulse Rate:  [0-123] 123 (08/21 0508) Resp:  [8-48] 18 (08/21 0508) BP: (119-144)/(77-101) 136/83 (08/21 0508) SpO2:  [0 %-100 %] 99 % (08/21 0508) Weight:  [190 lb 4.8 oz (86.3 kg)] 190 lb 4.8 oz (86.3 kg) (08/21 0508) Last BM Date: 05/26/17  Weight change: Filed Weights   05/26/17 0647 05/27/17 0544  05/28/17 0508  Weight: 187 lb 14.4 oz (85.2 kg) 190 lb (86.2 kg) 190 lb 4.8 oz (86.3 kg)    Intake/Output:   Intake/Output Summary (Last 24 hours) at 05/28/17 0743 Last data filed at 05/28/17 0500  Gross per 24 hour  Intake              700 ml  Output              300 ml  Net              400 ml     Physical Exam: General: Well appearing. No resp difficulty. HEENT: Normal Neck: Supple. JVP 6-7 cm. Carotids 2+ bilat; no bruits. No thyromegaly or nodule noted. Cor: PMI nondisplaced. Tachy, regular.  Lungs: CTAB, but diminished throughout.  Abdomen: Soft, non-tender, non-distended, no HSM. No bruits or masses. +BS  Extremities: No cyanosis, clubbing, or rash. 1-2+ peripheral edema.  Neuro: Alert & orientedx3, cranial nerves grossly intact. moves all 4 extremities w/o difficulty. Affect pleasant   Telemetry: Personally reviewed, Sinus tach 100-110. Occasional NSVT.   Labs: Basic Metabolic Panel:  Recent Labs Lab 05/24/17 0457 05/25/17 0520 05/26/17 0408 05/27/17 0255 05/28/17 0509  NA 138 136 134* 135 134*  K 3.7 3.4* 3.4* 4.1 5.1  CL 93* 90* 91* 94* 101  CO2 26 31 30 31 27   GLUCOSE 124* 144* 191* 165* 157*  BUN 19 13 20 17 13   CREATININE 0.88 0.87 0.87 0.83 0.67  CALCIUM 8.2* 8.6* 8.2* 8.5* 8.5*  MG 1.2* 1.3* 2.0 2.2  2.2    Liver Function Tests:  Recent Labs Lab 05/24/17 0457 05/25/17 0520 05/28/17 0509  AST 47* 40 33  ALT 22 22 17   ALKPHOS 75 76 67  BILITOT 1.8* 1.6* 1.0  PROT 5.9* 7.3 6.5  ALBUMIN 2.0* 2.3* 2.1*   No results for input(s): LIPASE, AMYLASE in the last 168 hours. No results for input(s): AMMONIA in the last 168 hours.  CBC:  Recent Labs Lab 05/23/17 0408 05/24/17 0457 05/25/17 0520 05/26/17 0408 05/27/17 0255 05/28/17 0509  WBC 22.1* 18.1* 20.4* 25.3* 17.9* 15.5*  NEUTROABS 16.4*  --   --   --   --  11.2*  HGB 12.9 12.8 13.6 12.4 12.9 12.3  HCT 41.5 42.0 42.9 39.4 41.2 39.8  MCV 88.1 87.3 86.0 85.3 86.4 87.9  PLT 478* 473*  604* 570* 577* 553*    Cardiac Enzymes:  Recent Labs Lab 05/21/17 2100 05/22/17 0203 05/22/17 0508 05/28/17 0509  CKTOTAL  --   --   --  61  TROPONINI 0.48* 0.51* 0.46*  --     BNP: BNP (last 3 results)  Recent Labs  05/21/17 2100  BNP 2,921.6*    ProBNP (last 3 results) No results for input(s): PROBNP in the last 8760 hours.    Other results:  Imaging: Ct Chest W Contrast  Addendum Date: 05/26/2017   ADDENDUM REPORT: 05/26/2017 10:25 ADDENDUM: The findings were called to Dr. Verlon Au. The patient had an ultrasound yesterday which is not available for my review. After discussing with the patient's referring physician, I suspect DVT in the right jugular vein which is expanded and unopacified. I also suspect the increased attenuation of fat in the left axilla, which in retrospect extends along the left subclavian vein, is likely secondary to a left subclavian DVT. This could also explain the mild adenopathy in the left axilla. No anatomic causes for the DVTs is identified. Electronically Signed   By: Dorise Bullion III M.D   On: 05/26/2017 10:25   Result Date: 05/26/2017 CLINICAL DATA:  Concern for pneumonia. EXAM: CT CHEST WITH CONTRAST TECHNIQUE: Multidetector CT imaging of the chest was performed during intravenous contrast administration. CONTRAST:  72mL ISOVUE-300 IOPAMIDOL (ISOVUE-300) INJECTION 61% COMPARISON:  Multiple chest x-rays since May 21, 2017 FINDINGS: Cardiovascular: Coronary artery calcifications are identified. Cardiomegaly. The thoracic aorta is unremarkable. This is not a study tailored to evaluate for pulmonary emboli. However, there are emboli located in the lower lobe pulmonary arteries. Evaluation of the proximal right upper lobe pulmonary artery is not well assessed due to streak artifact off the SVC but an embolus is not excluded. No emboli in the main pulmonary arteries. The left ventricle demonstrates a diameter of 7.3 cm and the right ventricle  demonstrates a diameter of 3.9 cm, with a ratio of well less than 0.9. No evidence of heart strain. Mediastinum/Nodes: Prominent nodes are seen in the left axilla, asymmetric to the right with a E representative node on series 5, image 41 measuring 12 mm. There is mild increased attenuation of fat of the left axilla is while. Shotty nodes are seen in the mediastinum without overt adenopathy. Tiny effusions are seen in the pleural spaces. A small pericardial effusion is noted posteriorly. The esophagus is normal. The thyroid is unremarkable. Lungs/Pleura: The central airways are normal. No pneumothorax. Bibasilar infiltrates are noted in in medial aspect of the right lower lobe and more diffusely in the left lower lobe. No other abnormalities are seen within the lungs. Upper Abdomen: Cholelithiasis.  No other abnormalities in the upper abdomen. Musculoskeletal: No chest wall abnormality. No acute or significant osseous findings. IMPRESSION: 1. Bilateral pulmonary emboli in the lower lobes and perhaps the right upper lobe. No heart strain identified. 2. Bibasilar pulmonary opacities. These opacities could represent pneumonia. Aspiration is possible. Pulmonary infarcts cannot be excluded given the pulmonary emboli. Recommend follow-up to resolution. 3. Coronary artery calcifications. 4. Mildly enlarged nodes and increased attenuation in the fat of the left axilla. This is a nonspecific finding. Recommend clinical correlation and short-term follow-up to resolution. 5. Shotty nodes in the mediastinum may be reactive given the findings in the lungs. 6. Small pleural effusions.  Small pericardial effusion. Electronically Signed: By: Dorise Bullion III M.D On: 05/26/2017 10:08     Medications:     Scheduled Medications: . apixaban  10 mg Oral BID  . cyclobenzaprine  7.5 mg Oral TID  . digoxin  0.125 mg Oral Daily  . guaiFENesin  600 mg Oral BID  . losartan  25 mg Oral BID  . omeprazole  20 mg Oral Daily  .  pneumococcal 23 valent vaccine  0.5 mL Intramuscular Tomorrow-1000  . potassium chloride  20 mEq Oral BID  . sodium chloride flush  3 mL Intravenous Q12H  . spironolactone  25 mg Oral Daily    Infusions: . sodium chloride    . magnesium sulfate 1 - 4 g bolus IVPB Stopped (05/27/17 2231)  . meropenem (MERREM) IV Stopped (05/28/17 0601)    PRN Medications: sodium chloride, acetaminophen, bisacodyl, gi cocktail, LORazepam, ondansetron (ZOFRAN) IV, ondansetron **OR** [DISCONTINUED] ondansetron (ZOFRAN) IV, senna-docusate, sodium chloride flush   Assessment:   51 y/o smoke admitted with acute systolic HF with biventricular dysfunction EF 15-20%. Also note to have markedly positive VQ suggestive of PE.     Plan/Discussion:     1. Acute systolic HF with biventricular dysfunction - EF 15-20% by echo with severe RV dysfunction - Volume status improved. RA/CVP 7 on RHC yesterday.  - Start on 40 mg po lasix. Will follow for need to cut back or increase, especially with starting Entresto.  - Continue digoxin 0.125 mg daily - Continue spiro 25 mg daily.  - Switch losartan to Entresto 24/26 mg BID.  STOP K supp.  - No b-blocker yet  - Continue TED hose - R/LHC 05/27/17 with mild, non-obstructive CAD and well-compensated hemodynamics.  2. Acute RIJ/sublcavian DVT with bilateral PE  - VQ with bilateral perfusion defects  - CT chest 05/27/17 with bilateral PE but no clot in main PAs. No RV strain - Heparin switched to PE dose Eliquis 05/27/17.   - Mother had multiple clots as well. Hypercoag panel still in process. Will ask Dr. Beryle Beams to weigh in as well.   3. Tobacco use - Encouraged complete cessation.   4. NSVT - keep K> 4.0 Mg > 2.0 - K overcorrected to 5.1. Hold K today.    5. Bilateral pulmonary infiltrates.  - On meropenem for PNA per primary.  - PCT only 0.38 - ANA and ANCA pending - RF WNL.    Length of Stay: [address]  Annamaria Helling  05/28/2017, 7:43  AM  Advanced Heart Failure Team Pager [phone] (M-F; 7a - 4p)  Please contact [facility] for night-coverage after hours (4p -7a ) and weekends on [email or web address]  Patient seen and examined with the above-signed Advanced Practice Provider and/or Housestaff. I personally reviewed laboratory data, imaging studies and relevant notes. I independently examined the patient and  formulated the important aspects of the plan. I have edited the note to reflect any of my changes or salient points. I have personally discussed the plan with the patient and/or family.  Results of cath reviewed with her and her family. Volume status much improved. Agree with changes in HF medicines as above. Scans reviewed personally which show extensive upper DVT and lymphadenopathy. W/u now ongoing for breast CA vs possbile lymphoma. Case d/w Drs.Samtani and [employer].  Glori Bickers, MD  4:14 PM

## 2017-05-29 LAB — CBC
HCT: 42.5 % (ref 36.0–46.0)
HEMOGLOBIN: 13.1 g/dL (ref 12.0–15.0)
MCH: 26.8 pg (ref 26.0–34.0)
MCHC: 30.8 g/dL (ref 30.0–36.0)
MCV: 86.9 fL (ref 78.0–100.0)
Platelets: 629 10*3/uL — ABNORMAL HIGH (ref 150–400)
RBC: 4.89 MIL/uL (ref 3.87–5.11)
RDW: 16.2 % — ABNORMAL HIGH (ref 11.5–15.5)
WBC: 15 10*3/uL — ABNORMAL HIGH (ref 4.0–10.5)

## 2017-05-29 LAB — CARDIOLIPIN ANTIBODIES, IGG, IGM, IGA
Anticardiolipin IgA: <9 APL U/mL (ref 0–11)
Anticardiolipin IgG: <9 GPL U/mL (ref 0–14)

## 2017-05-29 LAB — BASIC METABOLIC PANEL
ANION GAP: 9 (ref 5–15)
BUN: 8 mg/dL (ref 6–20)
CALCIUM: 8.8 mg/dL — AB (ref 8.9–10.3)
CO2: 25 mmol/L (ref 22–32)
Chloride: 101 mmol/L (ref 101–111)
Creatinine, Ser: 0.59 mg/dL (ref 0.44–1.00)
GFR calc Af Amer: >60 mL/min (ref >60)
GFR calc non Af Amer: >60 mL/min (ref >60)
GLUCOSE: 128 mg/dL — AB (ref 65–99)
Potassium: 4.4 mmol/L (ref 3.5–5.1)
Sodium: 135 mmol/L (ref 135–145)

## 2017-05-29 LAB — LUPUS ANTICOAGULANT PANEL
DRVVT: 59 s — ABNORMAL HIGH (ref 0.0–47.0)
PTT LA: 41.6 s (ref 0.0–51.9)

## 2017-05-29 LAB — PROTEIN S, TOTAL: PROTEIN S AG TOTAL: 154 % — AB (ref 60–150)

## 2017-05-29 LAB — BETA-2-GLYCOPROTEIN I ABS, IGG/M/A: Beta-2-Glycoprotein I IgA: <9 GPI IgA units (ref 0–25)

## 2017-05-29 LAB — MAGNESIUM: MAGNESIUM: 2 mg/dL (ref 1.7–2.4)

## 2017-05-29 LAB — HEPARIN LEVEL (UNFRACTIONATED)
Heparin Unfractionated: >2.2 IU/mL — ABNORMAL HIGH (ref 0.30–0.70)
Heparin Unfractionated: 0.58 IU/mL (ref 0.30–0.70)

## 2017-05-29 LAB — APTT: aPTT: 78 seconds — ABNORMAL HIGH (ref 24–36)

## 2017-05-29 LAB — DRVVT MIX: dRVVT Mix: 44.4 s (ref 0.0–47.0)

## 2017-05-29 LAB — PROTEIN C ACTIVITY: PROTEIN C ACTIVITY: 74 % (ref 73–180)

## 2017-05-29 LAB — PROTEIN C, TOTAL: Protein C, Total: 62 % (ref 60–150)

## 2017-05-29 LAB — PROTEIN S ACTIVITY: Protein S Activity: 140 % (ref 63–140)

## 2017-05-29 MED ORDER — MUPIROCIN CALCIUM 2 % EX CREA
TOPICAL_CREAM | Freq: Every day | CUTANEOUS | Status: DC
  Administered 2017-05-29 – 2017-05-31 (×2): via TOPICAL
  Administered 2017-06-01: 1 via TOPICAL
  Filled 2017-05-29: qty 15

## 2017-05-29 NOTE — Progress Notes (Signed)
PROGRESS NOTE  Kylie Bennett  DTO:[id number] DOB: [birth year] DOA: 05/21/2017 PCP: Patient, No Pcp Per   Brief Narrative: Kylie Bennett is a 51 y.o. female with a history of tobacco use (quit July 2018) who presented to the ED for cough, fever, dyspnea on exertion and new bilateral leg swelling. Symptoms had not been improved with augmentin for bronchitis from urgent care. On arrival BNP was elevated at 2921, CXR with cardiomegaly and vascular congestion. ECG showed sinus tachycardia and troponin elevated at 0.48 > 0.51 > 0.46. Cardiology was consulted for new onset heart failure. Echo showed EF 15-20% with grade 3 diastolic dysfunction and LHC with clean coronaries on 8/20. Cardiac output/index estimated at 5.4/2.7. Diuresis was started and antibiotics were given for CAP. She developed left arm swelling, D-dimer was elevated and CECT of the chest demonstrated bilateral PEs with evidence of pulmonary infarction and clots in left subclavian and right jugular veins. Left axillary lymphadenopathy was noted. Subsequent V/Q scan was high probability for PE. Upper extremity venous duplex demonstrated DVT in the left subclavian and axillary veins. Heparin was started. Due to concern for hypercoagulability, hematology was consulted, recommending left axillary lymph node biopsy and ENT evaluation for occult head and neck CA. ENT performed laryngoscopy 8/21 which showed no evidence of malignancy. Plan is for lymph node biopsy 8/23.  Assessment & Plan: Principal Problem:   Acute CHF (congestive heart failure) (HCC) Active Problems:   Hyponatremia   Community acquired pneumonia of right lower lobe of lung ([place])   Acute systolic heart failure (HCC)   Bilateral pulmonary embolism (HCC)   Axillary adenopathy   Hoarseness of voice   Nonischemic cardiomyopathy (HCC)  Acute combined systolic and diastolic CHF: Nonischemic cardiomyopathy. EF 15-20% by echo, G3DD, with severe RV dysfunction. Suspect congestive  hepatopathy with mildly abnormal LFTs on admission, since resolved.  - Appears euvolemic on oral lasix. Continue daily weights, I/O. - Started spironolactone - Started entresto 24.26 mg BID. - Started digoxin 0.125 mg daily - Continue Spiro 25 mg daily.  - HF team holding beta blocker for now.   Acute upper extremity DVTs with bilateral PE's: No RV strain. ANA, ANCA, RF negative.  - Transitioned heparin > eliquis 8/20, but went back to heparin in anticipation of Bx 8/23. Will restart eliquis as able   - Appreciate hematology input. Plan LN Bx 8/23.  Community-acquired pneumonia: Bilateral pulmonary infiltrates on imaging. PCT reassuring. No sepsis.  - Continue meropenem through 8/23.   Lower extremity edema: Due to CHF as above, with resultant bullae, now wounds.  - WOC consulted  T2DM: Also a new diagnosis with HbA1c 7.1%.  - Will have diabetes educator discuss lifestyle modifications prior to discharge. Too much currently happening. Would favor holding medications pending follow up.   Tobacco use: Congratulated on cessation. Encouraged to continue.   DVT prophylaxis: IV heparin Code Status: Full Family Communication: None at bedside Disposition Plan: Pending further work up  Consultants:   HF team  Hematology, Dr. Beryle Beams  ENT, Dr. Wilburn Cornelia  IR  Procedures:  [facility] 05/27/17  Mid RCA lesion, 20 %stenosed.  Dist LAD lesion, 40 %stenosed. RHC hemodynamics AO = 123/89 (104) LV= 124/13 RA = 7 RV = 42/7 PA = 45/20 (29) PCW = 11 Fick cardiac output/index = 5.4/2.7 PVR = 3.3 WU SVR 1448 FA sat = 95% PA sat = 65%, 68%  Antimicrobials:  Augmentin > meropenem through 8/23.  Subjective: Has been adjusting to need for further work up, possible malignancy. Breathing is  improved, no chest pain.   Objective: Vitals:   05/28/17 1236 05/28/17 2021 05/29/17 0427 05/29/17 1157  BP: 128/89 126/84  118/76  Pulse: (!) 110 (!) 119  (!) 113  Resp: 18 19  18     Temp: 97.7 F (36.5 C) 98.7 F (37.1 C)  98.8 F (37.1 C)  TempSrc: Oral Oral  Oral  SpO2: 92% 98%  98%  Weight:   85.5 kg (188 lb 6.4 oz)   Height:        Intake/Output Summary (Last 24 hours) at 05/29/17 1510 Last data filed at 05/29/17 0906  Gross per 24 hour  Intake          1230.28 ml  Output              500 ml  Net           730.28 ml   Filed Weights   05/27/17 0544 05/28/17 0508 05/29/17 0427  Weight: 86.2 kg (190 lb) 86.3 kg (190 lb 4.8 oz) 85.5 kg (188 lb 6.4 oz)   General exam: 51 y.o. female in no distress  Respiratory system: Non-labored breathing. Diminished but clear without crackles or wheezes. Cardiovascular system: Regular tachycardia. No murmur, rub, or gallop. No JVD. Gastrointestinal system: Abdomen soft, non-tender, non-distended, with normoactive bowel sounds. No organomegaly or masses felt. Central nervous system: Alert and oriented. No focal neurological deficits. Extremities: LUE diffusely edematous, bilateral LE's with 1+dependent edema. Skin: Lateral ankles bilaterally with unroofed blisters with serous discharge, no odor, minimal surrounding erythema. Psychiatry: Judgement and insight appear normal. Mood & affect appropriate.   Data Reviewed: I have personally reviewed following labs and imaging studies  CBC:  Recent Labs Lab 05/23/17 0408  05/25/17 0520 05/26/17 0408 05/27/17 0255 05/28/17 0509 05/29/17 0421  WBC 22.1*  < > 20.4* 25.3* 17.9* 15.5* 15.0*  NEUTROABS 16.4*  --   --   --   --  11.2*  --   HGB 12.9  < > 13.6 12.4 12.9 12.3 13.1  HCT 41.5  < > 42.9 39.4 41.2 39.8 42.5  MCV 88.1  < > 86.0 85.3 86.4 87.9 86.9  PLT 478*  < > 604* 570* 577* 553* 629*  < > = values in this interval not displayed. Basic Metabolic Panel:  Recent Labs Lab 05/25/17 0520 05/26/17 0408 05/27/17 0255 05/28/17 0509 05/29/17 0421  NA 136 134* 135 134* 135  K 3.4* 3.4* 4.1 5.1 4.4  CL 90* 91* 94* 101 101  CO2 31 30 31 27 25   GLUCOSE 144* 191*  165* 157* 128*  BUN 13 20 17 13 8   CREATININE 0.87 0.87 0.83 0.67 0.59  CALCIUM 8.6* 8.2* 8.5* 8.5* 8.8*  MG 1.3* 2.0 2.2 2.2 2.0   GFR: Estimated Creatinine Clearance: 94.1 mL/min (by C-G formula based on SCr of 0.59 mg/dL). Liver Function Tests:  Recent Labs Lab 05/24/17 0457 05/25/17 0520 05/28/17 0509  AST 47* 40 33  ALT 22 22 17   ALKPHOS 75 76 67  BILITOT 1.8* 1.6* 1.0  PROT 5.9* 7.3 6.5  ALBUMIN 2.0* 2.3* 2.1*   No results for input(s): LIPASE, AMYLASE in the last 168 hours. No results for input(s): AMMONIA in the last 168 hours. Coagulation Profile:  Recent Labs Lab 05/23/17 0408  INR 1.33   Cardiac Enzymes:  Recent Labs Lab 05/28/17 0509  CKTOTAL 61   BNP (last 3 results) No results for input(s): PROBNP in the last 8760 hours. HbA1C: No results for input(s): HGBA1C in  the last 72 hours. CBG: No results for input(s): GLUCAP in the last 168 hours. Lipid Profile: No results for input(s): CHOL, HDL, LDLCALC, TRIG, CHOLHDL, LDLDIRECT in the last 72 hours. Thyroid Function Tests: No results for input(s): TSH, T4TOTAL, FREET4, T3FREE, THYROIDAB in the last 72 hours. Anemia Panel: No results for input(s): VITAMINB12, FOLATE, FERRITIN, TIBC, IRON, RETICCTPCT in the last 72 hours. Urine analysis:    Component Value Date/Time   COLORURINE YELLOW 05/21/2017 2320   APPEARANCEUR CLOUDY (A) 05/21/2017 2320   LABSPEC 1.010 05/21/2017 2320   PHURINE 5.5 05/21/2017 2320   GLUCOSEU NEGATIVE 05/21/2017 2320   HGBUR SMALL (A) 05/21/2017 2320   BILIRUBINUR NEGATIVE 05/21/2017 2320   BILIRUBINUR moderate 10/20/2014 1654   KETONESUR NEGATIVE 05/21/2017 2320   PROTEINUR 30 (A) 05/21/2017 2320   UROBILINOGEN 2.0 10/20/2014 1654   NITRITE NEGATIVE 05/21/2017 2320   LEUKOCYTESUR SMALL (A) 05/21/2017 2320   No results found for this or any previous visit (from the past 240 hour(s)).    Radiology Studies: Ct Soft Tissue Neck W Contrast  Result Date:  05/28/2017 CLINICAL DATA:  51 year old female with bilateral pulmonary emboli. Query jugular DVT. EXAM: CT NECK WITH CONTRAST TECHNIQUE: Multidetector CT imaging of the neck was performed using the standard protocol following the bolus administration of intravenous contrast. CONTRAST:  50mL ISOVUE-300 IOPAMIDOL (ISOVUE-300) INJECTION 61% COMPARISON:  Chest CTA 05/26/2017. FINDINGS: Pharynx and larynx: Laryngeal soft tissue contours are within normal limits. There is benign appearing asymmetry of the right laryngeal cartilage on series 3, image 73. This might be developmental or posttraumatic. Pharyngeal soft tissue contours are within normal limits. No parapharyngeal or retropharyngeal space abnormality or inflammation. Salivary glands: Sublingual space, submandibular glands and parotid glands are within normal limits. Thyroid: Thyroid parenchyma appears normal. Lymph nodes: Abnormal indistinct soft tissue at the left thoracic inlet best seen on coronal series 7, image 71 encompasses 21 x 26 x 27 mm (AP by transverse by CC) is associated with nearby a venous thrombosis (see below) and indistinct left level 4 lymph and right peritracheal node enlargement (individual nodes up to 13 mm short axis). The bilateral level 1 through level 3 lymph node stations are within normal limits. Vascular: No inflammatory changes about the right carotid space, but there is thrombosis of the entire right internal jugular vein (coronal image 71). There is associated thrombus within the right common facial vein (series 3, image 61), the right IJ bulb, and the intracranial right sigmoid and transverse venous sinuses (series 8, image 43). Nonocclusive thrombus within the right IJ extends to the right subclavian vein confluence and right innominate vein (series 3, image 111). The right subclavian vein otherwise appears normal. Furthermore, there is patchy thrombus throughout the left subclavian vein associated with the abnormal left  thoracic inlet soft tissue (coronal image 71). The left IJ and left sigmoid and transverse sinuses are patent. Major arterial structures in the neck and at the skullbase are patent. Limited intracranial: Negative. Visualized orbits: Negative. Mastoids and visualized paranasal sinuses: Clear except for right maxillary sinus mucous retention cysts. Skeleton: Poor posterior maxillary dentition on the right. Otherwise no acute or suspicious osseous lesion identified in the neck or at the skullbase. Upper chest: Partially visible increased left axillary and mediastinal lymph nodes. Lung apices are clear except for mild dependent ground-glass opacity on the right. Small left greater than right layering pleural effusions. IMPRESSION: 1. Positive for extensive venous thrombosis in the right head and neck: Right sigmoid dural sinus thrombosis, right  IJ thrombosis, right facial vein thrombosis. Patchy thrombus also in the left subclavian vein associated with # 2. 2. Indistinct soft tissue mass at the left thoracic inlet up to 2.7 cm most resembles a conglomeration of abnormal lymph nodes. No other neck mass or lymphadenopathy. 3. Consider Lymphoma and metastatic lymphadenopathy, and note also the presence of abnormal left axillary lymph nodes on the recent chest CTA. Lemierre's syndrome (Fusobacterium necrophorum throat infection) was also considered but felt unlikely given the absence of any neck inflammatory changes outside of the left thoracic inlet. 4. Study discussed by telephone with Dr. Nita Sells on 05/28/2017 at 0915 hours. Electronically Signed   By: Genevie Ann M.D.   On: 05/28/2017 09:24    Scheduled Meds: . cyclobenzaprine  7.5 mg Oral TID  . digoxin  0.125 mg Oral Daily  . furosemide  40 mg Oral Daily  . guaiFENesin  600 mg Oral BID  . mupirocin cream   Topical Daily  . omeprazole  20 mg Oral Daily  . pneumococcal 23 valent vaccine  0.5 mL Intramuscular Tomorrow-1000  . sacubitril-valsartan  1  tablet Oral BID  . sodium chloride flush  3 mL Intravenous Q12H  . spironolactone  25 mg Oral Daily   Continuous Infusions: . sodium chloride    . heparin 1,850 Units/hr (05/29/17 0147)  . magnesium sulfate 1 - 4 g bolus IVPB Stopped (05/29/17 1025)  . meropenem (MERREM) IV 1 g (05/29/17 0543)     LOS: 8 days   Time spent: 25 minutes.  Vance Gather, MD [facility] Pager [phone]  If 7PM-7AM, please contact night-coverage [email or web address] Password [email or web address] 05/29/2017, 3:10 PM

## 2017-05-29 NOTE — Progress Notes (Addendum)
ANTICOAGULATION CONSULT NOTE - Follow Up Consult  Pharmacy Consult for Heparin  Indication: pulmonary embolus  Allergies  Allergen Reactions  . Unasyn [Ampicillin-Sulbactam Sodium] Other (See Comments)    Pt gets hot/flushed with infusion     Patient Measurements: Height: 5\' 8"  (172.7 cm) Weight: 188 lb 6.4 oz (85.5 kg) IBW/kg (Calculated) : 63.9 kg Heparin Dosing Weight: 85 kg  Labs:  Recent Labs  05/27/17 0255  05/28/17 0509 05/28/17 1855 05/29/17 0421 05/29/17 0704  HGB 12.9  --  12.3  --  13.1  --   HCT 41.2  --  39.8  --  42.5  --   PLT 577*  --  553*  --  629*  --   APTT  --   --   --  60* >200* 78*  HEPARINUNFRC  --   < >  --  0.66 >2.20* 0.58  CREATININE 0.83  --  0.67  --  0.59  --   CKTOTAL  --   --  61  --   --   --   < > = values in this interval not displayed.  Estimated Creatinine Clearance: 94.1 mL/min (by C-G formula based on SCr of 0.59 mg/dL).  Assessment: Heparin gtt for new onset bilateral PE found on CT from 8/19. Levels had been within therapeutic range. Was transitioned to apixaban following cath yesterday at 10 mg. Received one dose last night; however, plan for potential biopsy so transitioning back to heparin infusion.   Due to initial aPTT slightly subtherapeutic on heparin infusion at 1750 units/hr, the rate was increased last night.  Will disregard aPTT/heparin level at 0421 since confirmed with nursing was drawn from same arm as heparin infusion. Repeat levels came back within goal range for both values (HL 0.58, aPTT 78). No issues with infusion or sxs of bleeding per notes.   Goal of Therapy:  Heparin level 0.3-0.7 units/ml Target aPTT level 66-102 s Monitor platelets by anticoagulation protocol: Yes   Plan:  1. Continue heparin gtt at 1850 units/hr 2. Monitor aPTT and heparin level with am labs  3. Monitor for s/sx of bleeding and CBC   Doylene Canard, PharmD Clinical Pharmacist  Pager: [phone] 05/29/2017, 8:23  AM

## 2017-05-29 NOTE — Progress Notes (Signed)
Advanced Heart Failure Rounding Note   Subjective:    Underwent R/LHC 05/27/17 with mild non-obstructive CAD, severe NICM with EF 15% by Echo, and well compensated hemodynamics. Full report below.   Heparin switched to PE dose Apixaban 05/27/17   CT Neck 05/28/17 with extensive DVT in the Right head and neck. Soft tissue mass of 2.7 cm and marked lymphadenopathy.   In good spirits despite CT. Deniis SOB. Denies CP, lightheadedness or dizziness. Plan for lymphnode biopsy 05/30/17. She is back on heparin.   L/RHC 05/27/17  Mid RCA lesion, 20 %stenosed.  Dist LAD lesion, 40 %stenosed. RHC hemodynamics AO = 123/89 (104) LV=   124/13 RA =  7 RV = 42/7 PA = 45/20 (29) PCW = 11 Fick cardiac output/index = 5.4/2.7 PVR = 3.3 WU SVR 1448 FA sat = 95% PA sat = 65%, 68%  CT chest 05/27/17 (Personally reviewed)  IMPRESSION: 1. Bilateral pulmonary emboli in the lower lobes and perhaps the right upper lobe. No heart strain identified. 2. Bibasilar pulmonary opacities. These opacities could represent pneumonia. Aspiration is possible. Pulmonary infarcts cannot be excluded given the pulmonary emboli. Recommend follow-up to resolution. 3. Coronary artery calcifications. 4. Mildly enlarged nodes and increased attenuation in the fat of the left axilla. This is a nonspecific finding. Recommend clinical correlation and short-term follow-up to resolution. 5. Shotty nodes in the mediastinum may be reactive given the findings in the lungs. 6. Small pleural effusions.  Small pericardial effusion.   Objective:   Weight Range:  Vital Signs:   Temp:  [97.7 F (36.5 C)-98.7 F (37.1 C)] 98.7 F (37.1 C) (08/21 2021) Pulse Rate:  [110-119] 119 (08/21 2021) Resp:  [18-19] 19 (08/21 2021) BP: (126-128)/(84-89) 126/84 (08/21 2021) SpO2:  [92 %-98 %] 98 % (08/21 2021) Weight:  [188 lb 6.4 oz (85.5 kg)] 188 lb 6.4 oz (85.5 kg) (08/22 0427) Last BM Date: 05/28/17  Weight change: Filed  Weights   05/27/17 0544 05/28/17 0508 05/29/17 0427  Weight: 190 lb (86.2 kg) 190 lb 4.8 oz (86.3 kg) 188 lb 6.4 oz (85.5 kg)    Intake/Output:   Intake/Output Summary (Last 24 hours) at 05/29/17 0736 Last data filed at 05/29/17 0600  Gross per 24 hour  Intake          1050.28 ml  Output                0 ml  Net          1050.28 ml     Physical Exam:  General: Well appearing. No resp difficulty. HEENT: Normal Neck: Supple. JVP 6-7. Carotids 2+ bilat; no bruits. No thyromegaly or nodule noted. Cor: PMI nondisplaced. Tachy. Regular.  Lungs: CTAB, but diminished in all fields.  Abdomen: Soft, non-tender, non-distended, no HSM. No bruits or masses. +BS  Extremities: No cyanosis, clubbing, or rash. 1+ edema 2/3 way to knees.  Neuro: Alert & orientedx3, cranial nerves grossly intact. moves all 4 extremities w/o difficulty. Affect pleasant  Skin: bilateral feet with macular rash from weeping earlier in admission. Tender to touch.   Telemetry: Personally reviewed, Sinus tach 100s   Labs: Basic Metabolic Panel:  Recent Labs Lab 05/24/17 0457 05/25/17 0520 05/26/17 0408 05/27/17 0255 05/28/17 0509 05/29/17 0421  NA 138 136 134* 135 134* 135  K 3.7 3.4* 3.4* 4.1 5.1 4.4  CL 93* 90* 91* 94* 101 101  CO2 26 31 30 31 27 25   GLUCOSE 124* 144* 191* 165* 157* 128*  BUN 19 13 20 17 13 8   CREATININE 0.88 0.87 0.87 0.83 0.67 0.59  CALCIUM 8.2* 8.6* 8.2* 8.5* 8.5* 8.8*  MG 1.2* 1.3* 2.0 2.2 2.2  --     Liver Function Tests:  Recent Labs Lab 05/24/17 0457 05/25/17 0520 05/28/17 0509  AST 47* 40 33  ALT 22 22 17   ALKPHOS 75 76 67  BILITOT 1.8* 1.6* 1.0  PROT 5.9* 7.3 6.5  ALBUMIN 2.0* 2.3* 2.1*   No results for input(s): LIPASE, AMYLASE in the last 168 hours. No results for input(s): AMMONIA in the last 168 hours.  CBC:  Recent Labs Lab 05/23/17 0408  05/25/17 0520 05/26/17 0408 05/27/17 0255 05/28/17 0509 05/29/17 0421  WBC 22.1*  < > 20.4* 25.3* 17.9* 15.5*  15.0*  NEUTROABS 16.4*  --   --   --   --  11.2*  --   HGB 12.9  < > 13.6 12.4 12.9 12.3 13.1  HCT 41.5  < > 42.9 39.4 41.2 39.8 42.5  MCV 88.1  < > 86.0 85.3 86.4 87.9 86.9  PLT 478*  < > 604* 570* 577* 553* 629*  < > = values in this interval not displayed.  Cardiac Enzymes:  Recent Labs Lab 05/28/17 0509  CKTOTAL 61    BNP: BNP (last 3 results)  Recent Labs  05/21/17 2100  BNP 2,921.6*    ProBNP (last 3 results) No results for input(s): PROBNP in the last 8760 hours.    Other results:  Imaging: Ct Soft Tissue Neck W Contrast  Result Date: 05/28/2017 CLINICAL DATA:  51 year old female with bilateral pulmonary emboli. Query jugular DVT. EXAM: CT NECK WITH CONTRAST TECHNIQUE: Multidetector CT imaging of the neck was performed using the standard protocol following the bolus administration of intravenous contrast. CONTRAST:  62mL ISOVUE-300 IOPAMIDOL (ISOVUE-300) INJECTION 61% COMPARISON:  Chest CTA 05/26/2017. FINDINGS: Pharynx and larynx: Laryngeal soft tissue contours are within normal limits. There is benign appearing asymmetry of the right laryngeal cartilage on series 3, image 73. This might be developmental or posttraumatic. Pharyngeal soft tissue contours are within normal limits. No parapharyngeal or retropharyngeal space abnormality or inflammation. Salivary glands: Sublingual space, submandibular glands and parotid glands are within normal limits. Thyroid: Thyroid parenchyma appears normal. Lymph nodes: Abnormal indistinct soft tissue at the left thoracic inlet best seen on coronal series 7, image 71 encompasses 21 x 26 x 27 mm (AP by transverse by CC) is associated with nearby a venous thrombosis (see below) and indistinct left level 4 lymph and right peritracheal node enlargement (individual nodes up to 13 mm short axis). The bilateral level 1 through level 3 lymph node stations are within normal limits. Vascular: No inflammatory changes about the right carotid space,  but there is thrombosis of the entire right internal jugular vein (coronal image 71). There is associated thrombus within the right common facial vein (series 3, image 61), the right IJ bulb, and the intracranial right sigmoid and transverse venous sinuses (series 8, image 43). Nonocclusive thrombus within the right IJ extends to the right subclavian vein confluence and right innominate vein (series 3, image 111). The right subclavian vein otherwise appears normal. Furthermore, there is patchy thrombus throughout the left subclavian vein associated with the abnormal left thoracic inlet soft tissue (coronal image 71). The left IJ and left sigmoid and transverse sinuses are patent. Major arterial structures in the neck and at the skullbase are patent. Limited intracranial: Negative. Visualized orbits: Negative. Mastoids and visualized paranasal sinuses: Clear except  for right maxillary sinus mucous retention cysts. Skeleton: Poor posterior maxillary dentition on the right. Otherwise no acute or suspicious osseous lesion identified in the neck or at the skullbase. Upper chest: Partially visible increased left axillary and mediastinal lymph nodes. Lung apices are clear except for mild dependent ground-glass opacity on the right. Small left greater than right layering pleural effusions. IMPRESSION: 1. Positive for extensive venous thrombosis in the right head and neck: Right sigmoid dural sinus thrombosis, right IJ thrombosis, right facial vein thrombosis. Patchy thrombus also in the left subclavian vein associated with # 2. 2. Indistinct soft tissue mass at the left thoracic inlet up to 2.7 cm most resembles a conglomeration of abnormal lymph nodes. No other neck mass or lymphadenopathy. 3. Consider Lymphoma and metastatic lymphadenopathy, and note also the presence of abnormal left axillary lymph nodes on the recent chest CTA. Lemierre's syndrome (Fusobacterium necrophorum throat infection) was also considered but felt  unlikely given the absence of any neck inflammatory changes outside of the left thoracic inlet. 4. Study discussed by telephone with Dr. Nita Sells on 05/28/2017 at 0915 hours. Electronically Signed   By: Genevie Ann M.D.   On: 05/28/2017 09:24     Medications:     Scheduled Medications: . cyclobenzaprine  7.5 mg Oral TID  . digoxin  0.125 mg Oral Daily  . furosemide  40 mg Oral Daily  . guaiFENesin  600 mg Oral BID  . omeprazole  20 mg Oral Daily  . pneumococcal 23 valent vaccine  0.5 mL Intramuscular Tomorrow-1000  . sacubitril-valsartan  1 tablet Oral BID  . sodium chloride flush  3 mL Intravenous Q12H  . spironolactone  25 mg Oral Daily    Infusions: . sodium chloride    . heparin 1,850 Units/hr (05/29/17 0147)  . magnesium sulfate 1 - 4 g bolus IVPB Stopped (05/29/17 0115)  . meropenem (MERREM) IV 1 g (05/29/17 0543)    PRN Medications: sodium chloride, acetaminophen, bisacodyl, gi cocktail, LORazepam, ondansetron (ZOFRAN) IV, ondansetron **OR** [DISCONTINUED] ondansetron (ZOFRAN) IV, senna-docusate, sodium chloride, sodium chloride flush   Assessment:   51 y/o smoke admitted with acute systolic HF with biventricular dysfunction EF 15-20%. Also note to have markedly positive VQ suggestive of PE.   Plan/Discussion:     1. Acute systolic HF with biventricular dysfunction - EF 15-20% by echo with severe RV dysfunction - Volume status stable on po lasix. RA/CVP 7 on RHC 05/27/17.  - Continue lasix 40 mg daily. - Continue Entresto 24.26 mg BID. Creatinine stable.  - Continue Digoxin 0.125 mg daily - Continue Spiro 25 mg daily.  - No b-blocker yet  - Continue TED hose - R/LHC 05/27/17 with mild, non-obstructive CAD and well-compensated hemodynamics.  2. Acute RIJ/sublcavian DVT with bilateral PE  - VQ with bilateral perfusion defects  - CT chest 05/27/17 with bilateral PE but no clot in main PAs. No RV strain - Heparin switched to PE dose Eliquis 05/27/17.   -  Mother had multiple clots as well. Hypercoag panel still in process. Will ask Dr. Beryle Beams to weigh in as well.  - May be related to malignancy, as scans show extensive upper DVT and lymphadenopathy.  3. Tobacco use - Encouraged complete cessation. No change.    4. NSVT - keep K> 4.0 Mg > 2.0 - K overcorrected to 5.1. Now 4.4 with holding K. Continue to hold for now.     5. Bilateral pulmonary infiltrates.  - On meropenem for PNA per primary. No  change.  - PCT only 0.38 - ANA and ANCA WNL (negative) - RF WNL.   6. ? Breast CA vs possible lymphoma - IR has seen. Plan for LN biopsy tomorrow.   7. Social - Discussed case with Care Management. Financial planner contacted patient yesterday.    8. bilateral feet wounds - Will have WOC examine and recommend care.   Length of Stay: [address]  Annamaria Helling  05/29/2017, 7:36 AM  Advanced Heart Failure Team Pager [phone] (M-F; 7a - 4p)  Please contact [facility] for night-coverage after hours (4p -7a ) and weekends on [email or web address]  Patient seen and examined with the above-signed Advanced Practice Provider and/or Housestaff. I personally reviewed laboratory data, imaging studies and relevant notes. I independently examined the patient and formulated the important aspects of the plan. I have edited the note to reflect any of my changes or salient points. I have personally discussed the plan with the patient and/or family.  Stable from HF perspective. Volume status stable. W/u for possible breast CA ongoing with planned LN biopsy tomorrow. Continue heparin.   Glori Bickers, MD  8:27 AM

## 2017-05-29 NOTE — Progress Notes (Signed)
Spoke to [facility] from pharmacy re redrawing of PTT which is >200. [facility] hospitalist paged. Waiting for return call

## 2017-05-29 NOTE — Consult Note (Addendum)
[facility] Nurse wound consult: Wound type: Consult requested for BLE.  Pt states previous edema to bilat legs has greatly improved.  Remains with mod amt to bilat feet. Left foot with red slightly raised rash of unknown etiology, no open wounds or drainage. Right foot with the same appearance; there is an intact clear fluid filled blister, .3X.3cm.  A previous blister has ruptured and evolved into a full thickness wound; 1.5X.5X.2cm, dark reddish tan wound bed, small amt yellow drainage, no odor. Dressing procedure/placement/frequency: Bactroban to promote moist healing to right outer ankle wound. Foam dressing to protect and promote healing.  Discussed plan of care with patient and she verbalized understanding. Encouraged to elevate feet to reduce edema. Please re-consult if further assistance is needed.  Thank-you,  Julien Girt MSN, [place], [place], [place], [place]

## 2017-05-29 NOTE — Progress Notes (Signed)
CRITICAL VALUE ALERT  Critical Value:  APTT>200  Date & Time Notied:  05/29/2017  Provider Notified: [facility] hospitalist  Orders Received/Actions taken: waiting

## 2017-05-29 NOTE — Progress Notes (Signed)
I stopped in to speak with patient regarding her ongoing hospitalization and new diagnoses.  She tells me that she is "taking it all as it comes" and has "no specific questions right now".  I will continue to follow and have reminded her to call me with any questions.

## 2017-05-30 ENCOUNTER — Encounter (HOSPITAL_COMMUNITY): Payer: Self-pay | Admitting: Interventional Radiology

## 2017-05-30 ENCOUNTER — Inpatient Hospital Stay (HOSPITAL_COMMUNITY): Payer: Medicaid Other

## 2017-05-30 DIAGNOSIS — D6859 Other primary thrombophilia: Secondary | ICD-10-CM

## 2017-05-30 DIAGNOSIS — I82A12 Acute embolism and thrombosis of left axillary vein: Secondary | ICD-10-CM

## 2017-05-30 HISTORY — PX: IR US GUIDE BX ASP/DRAIN: IMG2392

## 2017-05-30 LAB — CBC
HEMATOCRIT: 40.6 % (ref 36.0–46.0)
Hemoglobin: 12 g/dL (ref 12.0–15.0)
MCH: 26 pg (ref 26.0–34.0)
MCHC: 29.6 g/dL — ABNORMAL LOW (ref 30.0–36.0)
MCV: 88.1 fL (ref 78.0–100.0)
Platelets: 582 10*3/uL — ABNORMAL HIGH (ref 150–400)
RBC: 4.61 MIL/uL (ref 3.87–5.11)
RDW: 16.5 % — AB (ref 11.5–15.5)
WBC: 12 10*3/uL — AB (ref 4.0–10.5)

## 2017-05-30 LAB — BASIC METABOLIC PANEL
Anion gap: 9 (ref 5–15)
BUN: 9 mg/dL (ref 6–20)
CO2: 24 mmol/L (ref 22–32)
Calcium: 9.1 mg/dL (ref 8.9–10.3)
Chloride: 102 mmol/L (ref 101–111)
Creatinine, Ser: 0.55 mg/dL (ref 0.44–1.00)
Glucose, Bld: 111 mg/dL — ABNORMAL HIGH (ref 65–99)
POTASSIUM: 4.6 mmol/L (ref 3.5–5.1)
SODIUM: 135 mmol/L (ref 135–145)

## 2017-05-30 LAB — FACTOR 5 LEIDEN

## 2017-05-30 LAB — HEPARIN LEVEL (UNFRACTIONATED): Heparin Unfractionated: 0.41 IU/mL (ref 0.30–0.70)

## 2017-05-30 MED ORDER — HEPARIN (PORCINE) IN NACL 100-0.45 UNIT/ML-% IJ SOLN
1850.0000 [IU]/h | INTRAMUSCULAR | Status: DC
  Administered 2017-05-30 – 2017-05-31 (×2): 1850 [IU]/h via INTRAVENOUS
  Filled 2017-05-30 (×2): qty 250

## 2017-05-30 MED ORDER — FUROSEMIDE 20 MG PO TABS
20.0000 mg | ORAL_TABLET | Freq: Every day | ORAL | Status: DC
  Administered 2017-05-31 – 2017-06-01 (×2): 20 mg via ORAL
  Filled 2017-05-30 (×2): qty 1

## 2017-05-30 MED ORDER — LIDOCAINE HCL (PF) 1 % IJ SOLN
INTRAMUSCULAR | Status: AC
  Filled 2017-05-30: qty 30

## 2017-05-30 MED ORDER — LIDOCAINE HCL (PF) 1 % IJ SOLN
INTRAMUSCULAR | Status: DC | PRN
  Administered 2017-05-30: 5 mL

## 2017-05-30 MED ORDER — SACUBITRIL-VALSARTAN 49-51 MG PO TABS
1.0000 | ORAL_TABLET | Freq: Two times a day (BID) | ORAL | Status: DC
  Administered 2017-05-30 – 2017-06-01 (×4): 1 via ORAL
  Filled 2017-05-30 (×5): qty 1

## 2017-05-30 NOTE — Procedures (Signed)
Interventional Radiology Procedure Note  Procedure: US guided core biopsy of left supra-clavicular lymph node.   Findings: US shows normal appearing lymph node with typical architecture.  Not enlarged.  4 x 18g core biopsy .  Complications: None Recommendations:  - Ok to shower tomorrow - Do not submerge for 7 days - Routine wound care   Signed,  Dulcy Fanny. Earleen Newport, DO

## 2017-05-30 NOTE — Progress Notes (Signed)
Advanced Heart Failure Rounding Note   Subjective:    Underwent R/LHC 05/27/17 with mild non-obstructive CAD, severe NICM with EF 15% by Echo, and well compensated hemodynamics. Full report below.   Heparin switched to PE dose Apixaban 05/27/17   CT Neck 05/28/17 with extensive DVT in the Right head and neck. Soft tissue mass of 2.7 cm and marked lymphadenopathy. S/P lymph node biopsy today.   Denies SOB/Orthopnea. She has been walking in the halls.    L/RHC 05/27/17  Mid RCA lesion, 20 %stenosed.  Dist LAD lesion, 40 %stenosed. RHC hemodynamics AO = 123/89 (104) LV=   124/13 RA =  7 RV = 42/7 PA = 45/20 (29) PCW = 11 Fick cardiac output/index = 5.4/2.7 PVR = 3.3 WU SVR 1448 FA sat = 95% PA sat = 65%, 68%  CT chest 05/27/17 (Personally reviewed)  IMPRESSION: 1. Bilateral pulmonary emboli in the lower lobes and perhaps the right upper lobe. No heart strain identified. 2. Bibasilar pulmonary opacities. These opacities could represent pneumonia. Aspiration is possible. Pulmonary infarcts cannot be excluded given the pulmonary emboli. Recommend follow-up to resolution. 3. Coronary artery calcifications. 4. Mildly enlarged nodes and increased attenuation in the fat of the left axilla. This is a nonspecific finding. Recommend clinical correlation and short-term follow-up to resolution. 5. Shotty nodes in the mediastinum may be reactive given the findings in the lungs. 6. Small pleural effusions.  Small pericardial effusion.   Objective:   Weight Range:  Vital Signs:   Temp:  [98.5 F (36.9 C)-98.9 F (37.2 C)] 98.9 F (37.2 C) (08/23 0606) Pulse Rate:  [95-117] 117 (08/23 1029) Resp:  [18-20] 20 (08/23 0606) BP: (118-136)/(76-87) 136/87 (08/23 0606) SpO2:  [96 %-100 %] 100 % (08/23 0606) Weight:  [185 lb 8 oz (84.1 kg)] 185 lb 8 oz (84.1 kg) (08/23 0606) Last BM Date: 05/28/17  Weight change: Filed Weights   05/28/17 0508 05/29/17 0427 05/30/17 0606    Weight: 190 lb 4.8 oz (86.3 kg) 188 lb 6.4 oz (85.5 kg) 185 lb 8 oz (84.1 kg)    Intake/Output:   Intake/Output Summary (Last 24 hours) at 05/30/17 1119 Last data filed at 05/30/17 0609  Gross per 24 hour  Intake                0 ml  Output             1100 ml  Net            -1100 ml     Physical Exam: General:  Well appearing. No resp difficulty. Sitting on the side of the bed.  HEENT: normal Neck: supple. no JVD. Carotids 2+ bilat; no bruits. No lymphadenopathy or thryomegaly appreciated. Cor: PMI nondisplaced. Tachy Regular rate & rhythm. No rubs, gallops or murmurs. Lungs: clear on room air.  Abdomen: soft, nontender, nondistended. No hepatosplenomegaly. No bruits or masses. Good bowel sounds. Extremities: no cyanosis, clubbing, rash, R and LLE 1+ edema. Healing sores on both ankles  Neuro: alert & orientedx3, cranial nerves grossly intact. moves all 4 extremities w/o difficulty. Affect pleasant   Telemetry: Sinus Tach 110s personally reviewed.    Labs: Basic Metabolic Panel:  Recent Labs Lab 05/25/17 0520 05/26/17 0408 05/27/17 0255 05/28/17 0509 05/29/17 0421 05/30/17 0445  NA 136 134* 135 134* 135 135  K 3.4* 3.4* 4.1 5.1 4.4 4.6  CL 90* 91* 94* 101 101 102  CO2 31 30 31 27 25 24   GLUCOSE 144* 191*  165* 157* 128* 111*  BUN 13 20 17 13 8 9   CREATININE 0.87 0.87 0.83 0.67 0.59 0.55  CALCIUM 8.6* 8.2* 8.5* 8.5* 8.8* 9.1  MG 1.3* 2.0 2.2 2.2 2.0  --     Liver Function Tests:  Recent Labs Lab 05/24/17 0457 05/25/17 0520 05/28/17 0509  AST 47* 40 33  ALT 22 22 17   ALKPHOS 75 76 67  BILITOT 1.8* 1.6* 1.0  PROT 5.9* 7.3 6.5  ALBUMIN 2.0* 2.3* 2.1*   No results for input(s): LIPASE, AMYLASE in the last 168 hours. No results for input(s): AMMONIA in the last 168 hours.  CBC:  Recent Labs Lab 05/26/17 0408 05/27/17 0255 05/28/17 0509 05/29/17 0421 05/30/17 0445  WBC 25.3* 17.9* 15.5* 15.0* 12.0*  NEUTROABS  --   --  11.2*  --   --   HGB 12.4  12.9 12.3 13.1 12.0  HCT 39.4 41.2 39.8 42.5 40.6  MCV 85.3 86.4 87.9 86.9 88.1  PLT 570* 577* 553* 629* 582*    Cardiac Enzymes:  Recent Labs Lab 05/28/17 0509  CKTOTAL 61    BNP: BNP (last 3 results)  Recent Labs  05/21/17 2100  BNP 2,921.6*    ProBNP (last 3 results) No results for input(s): PROBNP in the last 8760 hours.    Other results:  Imaging: Ir US Guide Bx Asp/drain  Result Date: 05/30/2017 INDICATION: 51 year old female with a history of bilateral upper extremity DVT. CT demonstrates questionable lymph node in the left supraclavicular region and she has been referred for a biopsy. EXAM: ULTRASOUND-GUIDED BIOPSY LEFT SUPRACLAVICULAR LYMPH NODE MEDICATIONS: None. ANESTHESIA/SEDATION: None FLUOROSCOPY TIME:  None COMPLICATIONS: None PROCEDURE: Informed written consent was obtained from the patient after a thorough discussion of the procedural risks, benefits and alternatives. All questions were addressed. Maximal Sterile Barrier Technique was utilized including caps, mask, sterile gowns, sterile gloves, sterile drape, hand hygiene and skin antiseptic. A timeout was performed prior to the initiation of the procedure. Patient positioned supine position on the ultrasound stretcher. Ultrasound imaging of the left supraclavicular region performed with images stored and sent to PACs. The patient is prepped and draped in the usual sterile fashion. The skin and subcutaneous tissues were generously infiltrated 1% lidocaine for local anesthesia. A small stab incision was made with 11 blade scalpel. Using ultrasound guidance, multiple 18 gauge core biopsy of lymph node in the left supraclavicular region acquired. Tissue specimen placed in the saline. Final image was stored. Patient tolerated the procedure well and remained hemodynamically stable throughout. No complications were encountered and no significant blood loss. FINDINGS: Ultrasound survey demonstrates lymph node in the left  supraclavicular region which likely correlates to the CT. The lymph node is not enlarged, and maintains typical architecture. IMPRESSION: Status post ultrasound-guided biopsy of left supraclavicular lymph node. Tissue specimen sent to pathology for complete histopathologic analysis. Signed, Dulcy Fanny. Earleen Newport, DO [facility] Radiology Electronically Signed   By: Corrie Mckusick D.O.   On: 05/30/2017 09:57     Medications:     Scheduled Medications: . cyclobenzaprine  7.5 mg Oral TID  . digoxin  0.125 mg Oral Daily  . furosemide  40 mg Oral Daily  . guaiFENesin  600 mg Oral BID  . lidocaine (PF)      . mupirocin cream   Topical Daily  . omeprazole  20 mg Oral Daily  . sacubitril-valsartan  1 tablet Oral BID  . sodium chloride flush  3 mL Intravenous Q12H  .  spironolactone  25 mg Oral Daily    Infusions: . sodium chloride    . heparin    . magnesium sulfate 1 - 4 g bolus IVPB 2 g (05/30/17 1117)  . meropenem (MERREM) IV 1 g (05/30/17 0507)    PRN Medications: sodium chloride, acetaminophen, bisacodyl, gi cocktail, lidocaine (PF), LORazepam, ondansetron (ZOFRAN) IV, ondansetron **OR** [DISCONTINUED] ondansetron (ZOFRAN) IV, senna-docusate, sodium chloride, sodium chloride flush   Assessment:   51 y/o smoke admitted with acute systolic HF with biventricular dysfunction EF 15-20%. Also note to have markedly positive VQ suggestive of PE.   Plan/Discussion:     1. Acute systolic HF with biventricular dysfunction - EF 15-20% by echo with severe RV dysfunction. NICM - Volume status stable.   - Cut lasix back to 20 mg daily. Renal function stable.  - Increase entresto 49-51 mg twice a day.    - Continue Digoxin 0.125 mg daily - Continue Spiro 25 mg daily.  - No b-blocker yet  -Unable to wear ted hose due to wounds on feet.  - R/LHC 05/27/17 with mild, non-obstructive CAD and well-compensated hemodynamics.  2. Acute RIJ/sublcavian DVT  with bilateral PE  - VQ with bilateral perfusion defects  - CT chest 05/27/17 with bilateral PE but no clot in main PAs. No RV strain -Off heparin until later this afternoon due to lymph node biopsy.  - Mother had multiple clots as well. Hypercoag panel still in process. Will ask Dr. Beryle Beams to weigh in as well.  - May be related to malignancy, as scans show extensive upper DVT and lymphadenopathy. - on heparin drip  3. Tobacco use - Quit smoking prior to admit.     4. NSVT - keep K> 4.0 Mg > 2.0 - K 4.6      5. Bilateral pulmonary infiltrates.  - On meropenem for PNA per primary. No change.  - PCT only 0.38 - ANA and ANCA WNL (negative) - RF WNL.   6. ? Breast CA vs possible lymphoma - IR has seen. S/P lymph node biopsy today.     7. Social -  Network engineer contacted.    8. bilateral feet wounds - WOC appreciated. Continue conservative wound care.    Consult cardiac rehab.    Length of Stay: [place], NP  05/30/2017, 11:19 AM  Advanced Heart Failure Team Pager [phone] (M-F; [place])  Please contact [facility] for night-coverage after hours (4p -7a ) and weekends on [email or web address]  Patient seen and examined with Darrick Grinder, NP. We discussed all aspects of the encounter. I agree with the assessment and plan as stated above.   Improving slowly but remains quite tachycardic. Agree with careful titration of Entresto. No b-blocker yet. May try low-dose corlanor soon. On heparin for DVTs/PE.   She underwent LN biopsy today. I d/w Dr. Lyndon Code in Pathology who will review case with [place] in am.  Glori Bickers, MD  8:48 PM

## 2017-05-30 NOTE — Progress Notes (Signed)
ANTICOAGULATION CONSULT NOTE - Follow Up Consult  Pharmacy Consult for Heparin  Indication: pulmonary embolus  Allergies  Allergen Reactions  . Unasyn [Ampicillin-Sulbactam Sodium] Other (See Comments)    Pt gets hot/flushed with infusion     Patient Measurements: Height: 5\' 8"  (172.7 cm) Weight: 185 lb 8 oz (84.1 kg) IBW/kg (Calculated) : 63.9 kg Heparin Dosing Weight: 85 kg  Labs:  Recent Labs  05/28/17 0509 05/28/17 1855 05/29/17 0421 05/29/17 0704 05/30/17 0445  HGB 12.3  --  13.1  --  12.0  HCT 39.8  --  42.5  --  40.6  PLT 553*  --  629*  --  582*  APTT  --  60* >200* 78*  --   HEPARINUNFRC  --  0.66 >2.20* 0.58  --   CREATININE 0.67  --  0.59  --  0.55  CKTOTAL 61  --   --   --   --     Estimated Creatinine Clearance: 93.5 mL/min (by C-G formula based on SCr of 0.55 mg/dL).  Medications: Heparin @ 1850 units/hr  Assessment: Heparin gtt for new onset bilateral PE found on CT from 8/19, transitioned to apixaban 8/20 (received 1 dose), then back to heparin pending lymph node biopsy. S/P biopsy today and heparin resumed 4 hours post procedure. Heparin level is therapeutic at 0.41.   Goal of Therapy:  Heparin level 0.3-0.7 units/ml Monitor platelets by anticoagulation protocol: Yes   Plan:  1) Continue heparin at 1850 units/hr 2) Follow up daily heparin level and CBC   Nena Jordan, PharmD, BCPS 05/30/2017, 10:30 PM

## 2017-05-30 NOTE — Progress Notes (Addendum)
ANTICOAGULATION CONSULT NOTE - Follow Up Consult  Pharmacy Consult for Heparin  Indication: pulmonary embolus  Allergies  Allergen Reactions  . Unasyn [Ampicillin-Sulbactam Sodium] Other (See Comments)    Pt gets hot/flushed with infusion     Patient Measurements: Height: 5\' 8"  (172.7 cm) Weight: 185 lb 8 oz (84.1 kg) IBW/kg (Calculated) : 63.9 kg Heparin Dosing Weight: 85 kg  Labs:  Recent Labs  05/28/17 0509 05/28/17 1855 05/29/17 0421 05/29/17 0704 05/30/17 0445  HGB 12.3  --  13.1  --  12.0  HCT 39.8  --  42.5  --  40.6  PLT 553*  --  629*  --  582*  APTT  --  60* >200* 78*  --   HEPARINUNFRC  --  0.66 >2.20* 0.58  --   CREATININE 0.67  --  0.59  --  0.55  CKTOTAL 61  --   --   --   --     Estimated Creatinine Clearance: 93.5 mL/min (by C-G formula based on SCr of 0.55 mg/dL).  Assessment: Heparin gtt for new onset bilateral PE found on CT from 8/19 Now s/p lymph node biopsy and heparin to resume 4 hours post procedure  Received one dose of apixaban 8/20, heparin level and PTT now correlating CBC stable   Goal of Therapy:  Heparin level 0.3-0.7 units/ml Monitor platelets by anticoagulation protocol: Yes   Plan:  Resume heparin at 1400 pm at 1850 units / hr Follow up 8 hour heparin level Daily heparin level, CBC  Follow up for transition to oral anti-coagulation (previously apixaban)  Thank you Anette Guarneri, PharmD [phone]   05/30/2017, 9:54 AM

## 2017-05-30 NOTE — Progress Notes (Signed)
CARDIAC REHAB PHASE I   PRE:  Rate/Rhythm: 119 ST  BP:  Supine:   Sitting: 122/70  Standing:    SaO2: 99%RA  MODE:  Ambulation: 100 ft   POST:  Rate/Rhythm: 122 ST  BP:  Supine:   Sitting: 98/67  Standing:    SaO2: 94%RA 1330-1400 Pt just finished lunch. Walked 100 ft on RA with steady gait. Cut walk short due to blisters on feet bothering her during walk. No SOB or dizziness. To recliner after walk. Asked pt if she had CHF booklet and she stated she had gotten three. Stated she also had low sodium diets. Sister in room.   Graylon Good, RN BSN  05/30/2017 1:57 PM

## 2017-05-30 NOTE — Progress Notes (Signed)
PROGRESS NOTE  Kylie Bennett  OHY:[id number] DOB: [birth year] DOA: 05/21/2017 PCP: Patient, No Pcp Per   Brief Narrative: Kylie Bennett is a 51 y.o. female with a history of tobacco use (quit July 2018) who presented to the ED for cough, fever, dyspnea on exertion and new bilateral leg swelling. Symptoms had not been improved with augmentin for bronchitis from urgent care. On arrival BNP was elevated at 2921, CXR with cardiomegaly and vascular congestion. ECG showed sinus tachycardia and troponin elevated at 0.48 > 0.51 > 0.46. Cardiology was consulted for new onset heart failure. Echo showed EF 15-20% with grade 3 diastolic dysfunction and LHC with clean coronaries on 8/20. Cardiac output/index estimated at 5.4/2.7. Diuresis was started and antibiotics were given for CAP. She developed left arm swelling, D-dimer was elevated and CECT of the chest demonstrated bilateral PEs with evidence of pulmonary infarction and clots in left subclavian and right jugular veins. Left axillary lymphadenopathy was noted. Subsequent V/Q scan was high probability for PE. Upper extremity venous duplex demonstrated DVT in the left subclavian and axillary veins. Heparin was started. Due to concern for hypercoagulability, hematology was consulted, recommending left axillary lymph node biopsy and ENT evaluation for occult head and neck CA. ENT performed laryngoscopy 8/21 which showed no evidence of malignancy. Plan is for lymph node biopsy 8/23.  Assessment & Plan: Principal Problem:   Acute CHF (congestive heart failure) (HCC) Active Problems:   Hyponatremia   Community acquired pneumonia of right lower lobe of lung ([place])   Acute systolic heart failure (HCC)   Bilateral pulmonary embolism (HCC)   Axillary adenopathy   Hoarseness of voice   Nonischemic cardiomyopathy (HCC)  Acute combined systolic and diastolic CHF: Nonischemic cardiomyopathy. EF 15-20% by echo, G3DD, with severe RV dysfunction. Suspect congestive  hepatopathy with mildly abnormal LFTs on admission, since resolved.  - Appreciate HF team following this patient. - Appears euvolemic on oral lasix. Continue daily weights, I/O. - Started spironolactone - Started entresto 24.26 mg BID, increased. - Started digoxin 0.125 mg daily - Continue Spiro 25 mg daily.  - HF team holding beta blocker for now.   Acute upper extremity DVTs with bilateral PE's: No RV strain. ANA, ANCA, RF negative.  - Transitioned heparin > eliquis 8/20, but went back to heparin in anticipation of Bx 8/23. Defer change to longterm anticoagulation to hematology.  - Appreciate hematology input. LN Bx performed 8/23, results pending. - Ordered mammograms as inpatient.   Community-acquired pneumonia: Bilateral pulmonary infiltrates on imaging. PCT reassuring. No sepsis.  - Continue meropenem through 8/23.   Lower extremity edema: Due to CHF as above, with resultant bullae, now wounds.  - WOC consulted  T2DM: Also a new diagnosis with HbA1c 7.1%.  - Will have diabetes educator discuss lifestyle modifications prior to discharge. Too much currently happening. Would favor holding medications pending follow up.   Tobacco use: Congratulated on cessation. Encouraged to continue.   DVT prophylaxis: IV heparin Code Status: Full Family Communication: Fiance and sister at bedside Disposition Plan: Pending further work up  Consultants:   HF team  Hematology, Dr. Beryle Beams  ENT, Dr. Wilburn Cornelia  IR, Dr. Earleen Newport  Procedures:  [facility] 05/27/17  Mid RCA lesion, 20 %stenosed.  Dist LAD lesion, 40 %stenosed. RHC hemodynamics AO = 123/89 (104) LV= 124/13 RA = 7 RV = 42/7 PA = 45/20 (29) PCW = 11 Fick cardiac output/index = 5.4/2.7 PVR = 3.3 WU SVR 1448 FA sat = 95% PA sat = 65%, 68%  Antimicrobials:  Augmentin > meropenem through 8/23.  Subjective: States breathing is better than at admission. No chest pain. Tolerated biopsy, is anxious to get  results.  Objective: Vitals:   05/29/17 2045 05/30/17 0606 05/30/17 1029 05/30/17 1248  BP: 121/87 136/87  110/76  Pulse: 95 (!) 115 (!) 117 (!) 115  Resp: 18 20  18   Temp: 98.5 F (36.9 C) 98.9 F (37.2 C)  (!) 97.4 F (36.3 C)  TempSrc: Oral Oral  Oral  SpO2: 96% 100%  99%  Weight:  84.1 kg (185 lb 8 oz)    Height:       Filed Weights   05/28/17 0508 05/29/17 0427 05/30/17 0606  Weight: 86.3 kg (190 lb 4.8 oz) 85.5 kg (188 lb 6.4 oz) 84.1 kg (185 lb 8 oz)   General exam: 51 y.o. female in no distress  Respiratory system: Non-labored breathing. Diminished but clear without crackles or wheezes. Cardiovascular system: Regular tachycardia. No murmur, rub, or gallop. No JVD. Gastrointestinal system: Abdomen soft, non-tender, non-distended, with normoactive bowel sounds. No organomegaly or masses felt. Central nervous system: Alert and oriented. No focal neurological deficits. Extremities: LUE diffusely edematous, bilateral LE's with 1+dependent edema. Skin: Lateral ankles bilaterally with unroofed blisters with serous discharge, no odor, minimal surrounding erythema. Small left supraclavicular puncture site c/d/i with bandaid.  Psychiatry: Judgement and insight appear normal. Mood & affect appropriate.   CBC:  Recent Labs Lab 05/26/17 0408 05/27/17 0255 05/28/17 0509 05/29/17 0421 05/30/17 0445  WBC 25.3* 17.9* 15.5* 15.0* 12.0*  NEUTROABS  --   --  11.2*  --   --   HGB 12.4 12.9 12.3 13.1 12.0  HCT 39.4 41.2 39.8 42.5 40.6  MCV 85.3 86.4 87.9 86.9 88.1  PLT 570* 577* 553* 629* 009*   Basic Metabolic Panel:  Recent Labs Lab 05/25/17 0520 05/26/17 0408 05/27/17 0255 05/28/17 0509 05/29/17 0421 05/30/17 0445  NA 136 134* 135 134* 135 135  K 3.4* 3.4* 4.1 5.1 4.4 4.6  CL 90* 91* 94* 101 101 102  CO2 31 30 31 27 25 24   GLUCOSE 144* 191* 165* 157* 128* 111*  BUN 13 20 17 13 8 9   CREATININE 0.87 0.87 0.83 0.67 0.59 0.55  CALCIUM 8.6* 8.2* 8.5* 8.5* 8.8* 9.1  MG  1.3* 2.0 2.2 2.2 2.0  --    GFR: Estimated Creatinine Clearance: 93.5 mL/min (by C-G formula based on SCr of 0.55 mg/dL).  Liver Function Tests:  Recent Labs Lab 05/24/17 0457 05/25/17 0520 05/28/17 0509  AST 47* 40 33  ALT 22 22 17   ALKPHOS 75 76 67  BILITOT 1.8* 1.6* 1.0  PROT 5.9* 7.3 6.5  ALBUMIN 2.0* 2.3* 2.1*   Cardiac Enzymes:  Recent Labs Lab 05/28/17 0509  CKTOTAL 61   Urine analysis:    Component Value Date/Time   COLORURINE YELLOW 05/21/2017 2320   APPEARANCEUR CLOUDY (A) 05/21/2017 2320   LABSPEC 1.010 05/21/2017 2320   PHURINE 5.5 05/21/2017 2320   GLUCOSEU NEGATIVE 05/21/2017 2320   HGBUR SMALL (A) 05/21/2017 2320   BILIRUBINUR NEGATIVE 05/21/2017 2320   BILIRUBINUR moderate 10/20/2014 1654   KETONESUR NEGATIVE 05/21/2017 2320   PROTEINUR 30 (A) 05/21/2017 2320   UROBILINOGEN 2.0 10/20/2014 1654   NITRITE NEGATIVE 05/21/2017 2320   LEUKOCYTESUR SMALL (A) 05/21/2017 2320   Radiology Studies: Ir US Guide Bx Asp/drain  Result Date: 05/30/2017 INDICATION: 51 year old female with a history of bilateral upper extremity DVT. CT demonstrates questionable lymph node in the left supraclavicular region  and she has been referred for a biopsy. EXAM: ULTRASOUND-GUIDED BIOPSY LEFT SUPRACLAVICULAR LYMPH NODE MEDICATIONS: None. ANESTHESIA/SEDATION: None FLUOROSCOPY TIME:  None COMPLICATIONS: None PROCEDURE: Informed written consent was obtained from the patient after a thorough discussion of the procedural risks, benefits and alternatives. All questions were addressed. Maximal Sterile Barrier Technique was utilized including caps, mask, sterile gowns, sterile gloves, sterile drape, hand hygiene and skin antiseptic. A timeout was performed prior to the initiation of the procedure. Patient positioned supine position on the ultrasound stretcher. Ultrasound imaging of the left supraclavicular region performed with images stored and sent to PACs. The patient is prepped and  draped in the usual sterile fashion. The skin and subcutaneous tissues were generously infiltrated 1% lidocaine for local anesthesia. A small stab incision was made with 11 blade scalpel. Using ultrasound guidance, multiple 18 gauge core biopsy of lymph node in the left supraclavicular region acquired. Tissue specimen placed in the saline. Final image was stored. Patient tolerated the procedure well and remained hemodynamically stable throughout. No complications were encountered and no significant blood loss. FINDINGS: Ultrasound survey demonstrates lymph node in the left supraclavicular region which likely correlates to the CT. The lymph node is not enlarged, and maintains typical architecture. IMPRESSION: Status post ultrasound-guided biopsy of left supraclavicular lymph node. Tissue specimen sent to pathology for complete histopathologic analysis. Signed, Dulcy Fanny. Earleen Newport, DO [facility] Radiology Electronically Signed   By: Corrie Mckusick D.O.   On: 05/30/2017 09:57    LOS: 9 days   Time spent: 25 minutes.  Vance Gather, MD [facility] Pager [phone]  If 7PM-7AM, please contact night-coverage [email or web address] Password [email or web address] 05/30/2017, 1:21 PM

## 2017-05-31 DIAGNOSIS — R599 Enlarged lymph nodes, unspecified: Secondary | ICD-10-CM

## 2017-05-31 LAB — CBC
HEMATOCRIT: 41.2 % (ref 36.0–46.0)
HEMOGLOBIN: 12.7 g/dL (ref 12.0–15.0)
MCH: 27.1 pg (ref 26.0–34.0)
MCHC: 30.8 g/dL (ref 30.0–36.0)
MCV: 87.8 fL (ref 78.0–100.0)
Platelets: 590 10*3/uL — ABNORMAL HIGH (ref 150–400)
RBC: 4.69 MIL/uL (ref 3.87–5.11)
RDW: 16.9 % — ABNORMAL HIGH (ref 11.5–15.5)
WBC: 13.5 10*3/uL — ABNORMAL HIGH (ref 4.0–10.5)

## 2017-05-31 LAB — BASIC METABOLIC PANEL
ANION GAP: 11 (ref 5–15)
BUN: 10 mg/dL (ref 6–20)
CO2: 24 mmol/L (ref 22–32)
Calcium: 9.1 mg/dL (ref 8.9–10.3)
Chloride: 100 mmol/L — ABNORMAL LOW (ref 101–111)
Creatinine, Ser: 0.61 mg/dL (ref 0.44–1.00)
GFR calc Af Amer: >60 mL/min (ref >60)
GFR calc non Af Amer: >60 mL/min (ref >60)
GLUCOSE: 114 mg/dL — AB (ref 65–99)
POTASSIUM: 4.3 mmol/L (ref 3.5–5.1)
Sodium: 135 mmol/L (ref 135–145)

## 2017-05-31 LAB — MAGNESIUM: Magnesium: 2 mg/dL (ref 1.7–2.4)

## 2017-05-31 LAB — DIGOXIN LEVEL: Digoxin Level: <0.2 ng/mL — ABNORMAL LOW (ref 0.8–2.0)

## 2017-05-31 LAB — HEPARIN LEVEL (UNFRACTIONATED): Heparin Unfractionated: 0.57 IU/mL (ref 0.30–0.70)

## 2017-05-31 MED ORDER — MAGNESIUM OXIDE 400 (241.3 MG) MG PO TABS
400.0000 mg | ORAL_TABLET | Freq: Two times a day (BID) | ORAL | Status: DC
  Administered 2017-05-31 – 2017-06-01 (×3): 400 mg via ORAL
  Filled 2017-05-31 (×3): qty 1

## 2017-05-31 MED ORDER — CARVEDILOL 3.125 MG PO TABS
3.1250 mg | ORAL_TABLET | Freq: Two times a day (BID) | ORAL | Status: DC
  Administered 2017-05-31 – 2017-06-01 (×2): 3.125 mg via ORAL
  Filled 2017-05-31 (×2): qty 1

## 2017-05-31 MED ORDER — APIXABAN 5 MG PO TABS
10.0000 mg | ORAL_TABLET | Freq: Two times a day (BID) | ORAL | Status: DC
  Administered 2017-05-31 – 2017-06-01 (×2): 10 mg via ORAL
  Filled 2017-05-31 (×2): qty 2

## 2017-05-31 MED ORDER — FLUCONAZOLE 200 MG PO TABS
200.0000 mg | ORAL_TABLET | Freq: Once | ORAL | Status: AC
  Administered 2017-05-31: 200 mg via ORAL
  Filled 2017-05-31: qty 1

## 2017-05-31 MED ORDER — APIXABAN 5 MG PO TABS
5.0000 mg | ORAL_TABLET | Freq: Two times a day (BID) | ORAL | Status: DC
Start: 2017-06-08 — End: 2017-06-01

## 2017-05-31 MED ORDER — FLUCONAZOLE 150 MG PO TABS: 150.0000 mg | ORAL_TABLET | Freq: Once | ORAL | Status: DC

## 2017-05-31 NOTE — Progress Notes (Signed)
Offered Pt assistance with bath, Pt stated she was waiting until she had her test done. Tech gave Pt all bath supplies.

## 2017-05-31 NOTE — Progress Notes (Signed)
Advanced Heart Failure Rounding Note   Subjective:    Underwent R/LHC 05/27/17 with mild non-obstructive CAD, severe NICM with EF 15% by Echo, and well compensated hemodynamics. Full report below.   Heparin switched to PE dose Apixaban 05/27/17   CT Neck 05/28/17 with extensive DVT in the Right head and neck. Soft tissue mass of 2.7 cm and marked lymphadenopathy. S/P lymph node biopsy 05/30/17.   Feeling good this morning. Denies SOB, lightheadedness or dizziness. Anxious about biopsy results. States the ABX she has received have given her a yeast infection.   Labs pending.   L/RHC 05/27/17  Mid RCA lesion, 20 %stenosed.  Dist LAD lesion, 40 %stenosed. RHC hemodynamics AO = 123/89 (104) LV=   124/13 RA =  7 RV = 42/7 PA = 45/20 (29) PCW = 11 Fick cardiac output/index = 5.4/2.7 PVR = 3.3 WU SVR 1448 FA sat = 95% PA sat = 65%, 68%  CT chest 05/27/17 (Personally reviewed)  IMPRESSION: 1. Bilateral pulmonary emboli in the lower lobes and perhaps the right upper lobe. No heart strain identified. 2. Bibasilar pulmonary opacities. These opacities could represent pneumonia. Aspiration is possible. Pulmonary infarcts cannot be excluded given the pulmonary emboli. Recommend follow-up to resolution. 3. Coronary artery calcifications. 4. Mildly enlarged nodes and increased attenuation in the fat of the left axilla. This is a nonspecific finding. Recommend clinical correlation and short-term follow-up to resolution. 5. Shotty nodes in the mediastinum may be reactive given the findings in the lungs. 6. Small pleural effusions.  Small pericardial effusion.   Objective:   Weight Range:  Vital Signs:   Temp:  [97.4 F (36.3 C)-98.7 F (37.1 C)] 98.7 F (37.1 C) (08/24 0555) Pulse Rate:  [115-122] 117 (08/24 0555) Resp:  [18-20] 18 (08/24 0555) BP: (107-125)/(61-82) 107/61 (08/24 0555) SpO2:  [98 %-99 %] 99 % (08/24 0555) Weight:  [183 lb 11.2 oz (83.3 kg)] 183 lb 11.2  oz (83.3 kg) (08/24 0555) Last BM Date: 05/28/17  Weight change: Filed Weights   05/29/17 0427 05/30/17 0606 05/31/17 0555  Weight: 188 lb 6.4 oz (85.5 kg) 185 lb 8 oz (84.1 kg) 183 lb 11.2 oz (83.3 kg)    Intake/Output:   Intake/Output Summary (Last 24 hours) at 05/31/17 0722 Last data filed at 05/31/17 0556  Gross per 24 hour  Intake              240 ml  Output              700 ml  Net             -460 ml     Physical Exam: General: Well appearing. No resp difficulty. HEENT: Normal Neck: Supple. JVP 6-7 cm. Carotids 2+ bilat; no bruits. No thyromegaly or nodule noted. Cor: PMI nondisplaced. Tachy, regular.  Lungs: CTAB, normal effort. Abdomen: Soft, non-tender, non-distended, no HSM. No bruits or masses. +BS  Extremities: No cyanosis, clubbing, rash, R and LLE no edema. Healing sores on both ankles.  Neuro: Alert & orientedx3, cranial nerves grossly intact. moves all 4 extremities w/o difficulty. Affect pleasant     Telemetry: Personally reviewed, Sinus tach 100-110s    Labs: Basic Metabolic Panel:  Recent Labs Lab 05/25/17 0520 05/26/17 0408 05/27/17 0255 05/28/17 0509 05/29/17 0421 05/30/17 0445  NA 136 134* 135 134* 135 135  K 3.4* 3.4* 4.1 5.1 4.4 4.6  CL 90* 91* 94* 101 101 102  CO2 31 30 31 27 25 24   GLUCOSE  144* 191* 165* 157* 128* 111*  BUN 13 20 17 13 8 9   CREATININE 0.87 0.87 0.83 0.67 0.59 0.55  CALCIUM 8.6* 8.2* 8.5* 8.5* 8.8* 9.1  MG 1.3* 2.0 2.2 2.2 2.0  --     Liver Function Tests:  Recent Labs Lab 05/25/17 0520 05/28/17 0509  AST 40 33  ALT 22 17  ALKPHOS 76 67  BILITOT 1.6* 1.0  PROT 7.3 6.5  ALBUMIN 2.3* 2.1*   No results for input(s): LIPASE, AMYLASE in the last 168 hours. No results for input(s): AMMONIA in the last 168 hours.  CBC:  Recent Labs Lab 05/26/17 0408 05/27/17 0255 05/28/17 0509 05/29/17 0421 05/30/17 0445  WBC 25.3* 17.9* 15.5* 15.0* 12.0*  NEUTROABS  --   --  11.2*  --   --   HGB 12.4 12.9 12.3  13.1 12.0  HCT 39.4 41.2 39.8 42.5 40.6  MCV 85.3 86.4 87.9 86.9 88.1  PLT 570* 577* 553* 629* 582*    Cardiac Enzymes:  Recent Labs Lab 05/28/17 0509  CKTOTAL 61    BNP: BNP (last 3 results)  Recent Labs  05/21/17 2100  BNP 2,921.6*    ProBNP (last 3 results) No results for input(s): PROBNP in the last 8760 hours.    Other results:  Imaging: Ir US Guide Bx Asp/drain  Result Date: 05/30/2017 INDICATION: 51 year old female with a history of bilateral upper extremity DVT. CT demonstrates questionable lymph node in the left supraclavicular region and she has been referred for a biopsy. EXAM: ULTRASOUND-GUIDED BIOPSY LEFT SUPRACLAVICULAR LYMPH NODE MEDICATIONS: None. ANESTHESIA/SEDATION: None FLUOROSCOPY TIME:  None COMPLICATIONS: None PROCEDURE: Informed written consent was obtained from the patient after a thorough discussion of the procedural risks, benefits and alternatives. All questions were addressed. Maximal Sterile Barrier Technique was utilized including caps, mask, sterile gowns, sterile gloves, sterile drape, hand hygiene and skin antiseptic. A timeout was performed prior to the initiation of the procedure. Patient positioned supine position on the ultrasound stretcher. Ultrasound imaging of the left supraclavicular region performed with images stored and sent to PACs. The patient is prepped and draped in the usual sterile fashion. The skin and subcutaneous tissues were generously infiltrated 1% lidocaine for local anesthesia. A small stab incision was made with 11 blade scalpel. Using ultrasound guidance, multiple 18 gauge core biopsy of lymph node in the left supraclavicular region acquired. Tissue specimen placed in the saline. Final image was stored. Patient tolerated the procedure well and remained hemodynamically stable throughout. No complications were encountered and no significant blood loss. FINDINGS: Ultrasound survey demonstrates lymph node in the left  supraclavicular region which likely correlates to the CT. The lymph node is not enlarged, and maintains typical architecture. IMPRESSION: Status post ultrasound-guided biopsy of left supraclavicular lymph node. Tissue specimen sent to pathology for complete histopathologic analysis. Signed, Dulcy Fanny. Earleen Newport, DO [facility] Radiology Electronically Signed   By: Corrie Mckusick D.O.   On: 05/30/2017 09:57     Medications:     Scheduled Medications: . cyclobenzaprine  7.5 mg Oral TID  . digoxin  0.125 mg Oral Daily  . furosemide  20 mg Oral Daily  . guaiFENesin  600 mg Oral BID  . mupirocin cream   Topical Daily  . omeprazole  20 mg Oral Daily  . sacubitril-valsartan  1 tablet Oral BID  . sodium chloride flush  3 mL Intravenous Q12H  . spironolactone  25 mg Oral Daily    Infusions: . sodium chloride    .  heparin 1,850 Units/hr (05/31/17 0207)  . magnesium sulfate 1 - 4 g bolus IVPB Stopped (05/30/17 2245)    PRN Medications: sodium chloride, acetaminophen, bisacodyl, gi cocktail, lidocaine (PF), LORazepam, ondansetron (ZOFRAN) IV, ondansetron **OR** [DISCONTINUED] ondansetron (ZOFRAN) IV, senna-docusate, sodium chloride, sodium chloride flush   Assessment:   51 y/o smoke admitted with acute systolic HF with biventricular dysfunction EF 15-20%. Also note to have markedly positive VQ suggestive of PE.   Plan/Discussion:     1. Acute systolic HF with biventricular dysfunction - EF 15-20% by echo with severe RV dysfunction. NICM - Volume status stable on exam.    - Continue lasix 20 mg daily. BMET pending.   - Continue entresto 49-51 mg twice a day.    - Continue Digoxin 0.125 mg daily - Continue Spiro 25 mg daily.  - No b-blocker yet  -Unable to wear ted hose due to wounds on feet.  - R/LHC 05/27/17 with mild, non-obstructive CAD and well-compensated hemodynamics.  2. Acute RIJ/sublcavian DVT with bilateral PE  - VQ with  bilateral perfusion defects  - CT chest 05/27/17 with bilateral PE but no clot in main PAs. No RV strain -Off heparin until later this afternoon due to lymph node biopsy.  - Mother had multiple clots as well. Hypercoag panel still in process. Have asked Dr. Beryle Beams to weigh in as well. Appreciate work-up. - May be related to malignancy, as scans show extensive upper DVT and lymphadenopathy. - on heparin drip. Will need to transition back to Eliquis (PE dosing). Will discuss timing with MD. Likely waiting on biopsy. Would not want to resume and prolong any further diagnosis or treatment if additional biopsy or procedure required.    3. Tobacco use - Quit smoking prior to admit.   No change.   4. NSVT - keep K> 4.0 Mg > 2.0 - BMET and Mg pending this am.   5. Bilateral pulmonary infiltrates.  - On meropenem for PNA per primary. No change.  - PCT only 0.38 - ANA and ANCA WNL (negative) - RF WNL.   6. ? Breast CA vs possible lymphoma - IR has seen. S/P lymph node biopsy 05/30/17. Awaiting pathology.   7. Social -  Network engineer contacted.  No change.   8. bilateral feet wounds - WOC appreciated. Continue conservative wound care.    Consult cardiac rehab. She is stable from a HF perspective. Work-up of possible malignancy on-going. Labs pending this am.   Length of Stay: [address]  Annamaria Helling  05/31/2017, 7:22 AM  Advanced Heart Failure Team Pager [phone] (M-F; 7a - 4p)  Please contact [facility] for night-coverage after hours (4p -7a ) and weekends on [email or web address]  Patient seen and examined with the above-signed Advanced Practice Provider and/or Housestaff. I personally reviewed laboratory data, imaging studies and relevant notes. I independently examined the patient and formulated the important aspects of the plan. I have edited the note to reflect any of my changes or salient points. I have personally discussed the plan with the patient and/or family.  HF  relatively stable though she remains tachycardic. Volume status looks good. As she is more stable now will add very low-dose carvedilol and see how she tolerates.   I reviewed her LN biopsy with Dr. Lyndon Code in Pathology. He feels it represents non-lymphoma carcinoma but cannot say more at this point but definitely feels it is cancerous. Feels we will have a more firm diagnosis on Monday.   I discussed this at  length with Ms. Olen Pel. I feel that we can likely send her home over the weekend (on Eliquis) and f/u with her next week to continue further planning. She will discuss with her family.   LE wounds healing well. She is requesting Diflucan for yeast infection. We will give one dose.   Total time spent 45 minutes. Over half that time spent discussing above.   Glori Bickers, MD  5:54 PM

## 2017-05-31 NOTE — Progress Notes (Signed)
Paged PA- Jonni Sanger, regarding IV Mg.   Per PA discontinuing medication.

## 2017-05-31 NOTE — Progress Notes (Signed)
ANTICOAGULATION CONSULT NOTE - Follow Up Consult  Pharmacy Consult for Heparin  Indication: pulmonary embolus  Allergies  Allergen Reactions  . Unasyn [Ampicillin-Sulbactam Sodium] Other (See Comments)    Pt gets hot/flushed with infusion     Patient Measurements: Height: 5\' 8"  (172.7 cm) Weight: 183 lb 11.2 oz (83.3 kg) IBW/kg (Calculated) : 63.9 kg Heparin Dosing Weight: 85 kg  Labs:  Recent Labs  05/28/17 1855  05/29/17 0421 05/29/17 0704 05/30/17 0445 05/30/17 2148 05/31/17 0732  HGB  --   < > 13.1  --  12.0  --  12.7  HCT  --   --  42.5  --  40.6  --  41.2  PLT  --   --  629*  --  582*  --  590*  APTT 60*  --  >200* 78*  --   --   --   HEPARINUNFRC 0.66  --  >2.20* 0.58  --  0.41 0.57  CREATININE  --   --  0.59  --  0.55  --  0.61  < > = values in this interval not displayed.  Estimated Creatinine Clearance: 93.1 mL/min (by C-G formula based on SCr of 0.61 mg/dL).  Medications: Heparin @ 1850 units/hr  Assessment: Heparin gtt for new onset bilateral PE found on CT from 8/19, transitioned to apixaban 8/20 (received 1 dose), then back to heparin pending lymph node biopsy. S/P biopsy today and heparin resumed 4 hours post procedure. Heparin level is therapeutic at 0.57  Goal of Therapy:  Heparin level 0.3-0.7 units/ml Monitor platelets by anticoagulation protocol: Yes   Plan:  Continue heparin at 1850 units/hr Follow up daily heparin level and CBC  Thank you Anette Guarneri, PharmD [phone]  05/31/2017, 11:30 AM

## 2017-05-31 NOTE — Progress Notes (Signed)
Hematology: Small area of biopsy with area of cells suspicious for non lymphoma malignancy but unable to call definitively; additional specimen being processed and special stains being done. We may have more info later today.  If malignancy confirmed - please involve med Oncology at cancer center to arrange expeditious outpatient evaluation. I will expedite this if we get definitive Path today. I will be out of town due to family emergency.   Murriel Hopper, MD, [place]  Hematology-Oncology/Internal Medicine

## 2017-05-31 NOTE — Progress Notes (Signed)
ANTICOAGULATION CONSULT NOTE - Follow Up Consult  Pharmacy Consult for Heparin > eliquis Indication: pulmonary embolus  Allergies  Allergen Reactions  . Unasyn [Ampicillin-Sulbactam Sodium] Other (See Comments)    Pt gets hot/flushed with infusion     Patient Measurements: Height: 5\' 8"  (172.7 cm) Weight: 183 lb 11.2 oz (83.3 kg) IBW/kg (Calculated) : 63.9 kg Heparin Dosing Weight: 85 kg  Labs:  Recent Labs  05/28/17 1855  05/29/17 0421 05/29/17 0704 05/30/17 0445 05/30/17 2148 05/31/17 0732  HGB  --   < > 13.1  --  12.0  --  12.7  HCT  --   --  42.5  --  40.6  --  41.2  PLT  --   --  629*  --  582*  --  590*  APTT 60*  --  >200* 78*  --   --   --   HEPARINUNFRC 0.66  --  >2.20* 0.58  --  0.41 0.57  CREATININE  --   --  0.59  --  0.55  --  0.61  < > = values in this interval not displayed.  Estimated Creatinine Clearance: 93.1 mL/min (by C-G formula based on SCr of 0.61 mg/dL).   Assessment: 52 YOF onHeparin gtt for new onset bilateral PE, now to transition to eliquis. crcl ~ 90 ml/min. LFTs wnl  Goal of Therapy:  Monitor platelets by anticoagulation protocol: Yes   Plan:  - Eliquis 10 mg PO BID x 7 days through 8/31, then 5mg  BID from 9/1 - D/C IV heparin when the first dose of eliquis is given.  Thank you  Maryanna Shape, PharmD, BCPS  Clinical Pharmacist  Pager: [phone]   05/31/2017, 5:36 PM

## 2017-05-31 NOTE — Progress Notes (Signed)
PROGRESS NOTE  Kylie Bennett  CZY:[id number] DOB: [birth year] DOA: 05/21/2017 PCP: Patient, No Pcp Per   Brief Narrative: Kylie Bennett is a 51 y.o. female with a history of tobacco use (quit July 2018) who presented to the ED for cough, fever, dyspnea on exertion and new bilateral leg swelling. Symptoms had not been improved with augmentin for bronchitis from urgent care. On arrival BNP was elevated at 2921, CXR with cardiomegaly and vascular congestion. ECG showed sinus tachycardia and troponin elevated at 0.48 > 0.51 > 0.46. Cardiology was consulted for new onset heart failure. Echo showed EF 15-20% with grade 3 diastolic dysfunction and LHC with clean coronaries on 8/20. Cardiac output/index estimated at 5.4/2.7. Diuresis was started and antibiotics were given for CAP. She developed left arm swelling, D-dimer was elevated and CECT of the chest demonstrated bilateral PEs with evidence of pulmonary infarction and clots in left subclavian and right jugular veins. Left axillary lymphadenopathy was noted. Subsequent V/Q scan was high probability for PE. Upper extremity venous duplex demonstrated DVT in the left subclavian and axillary veins. Heparin was started. Due to concern for hypercoagulability, hematology was consulted, recommending left axillary lymph node biopsy and ENT evaluation for occult head and neck CA. ENT performed laryngoscopy 8/21 which showed no evidence of malignancy. Lymph node biopsy was performed 8/23 with no finalized report available at this time. She is nearing stabilization, and is expected to discharge 8/25 with plan for follow up next week in [facility] or Dr. Azucena Freed clinic.  Assessment & Plan: Principal Problem:   Acute CHF (congestive heart failure) (HCC) Active Problems:   Hyponatremia   Community acquired pneumonia of right lower lobe of lung ([place])   Acute systolic heart failure (HCC)   Bilateral pulmonary embolism (HCC)   Axillary adenopathy   Hoarseness of  voice   Nonischemic cardiomyopathy (HCC)  Acute combined systolic and diastolic CHF: Nonischemic cardiomyopathy. EF 15-20% by echo, G3DD, with severe RV dysfunction. Suspect congestive hepatopathy with mildly abnormal LFTs on admission, since resolved.  - Appreciate HF team following this patient. She is stable from their perspective. - Appears euvolemic on oral lasix. Continue daily weights, I/O. - Started spironolactone - Started entresto, increased dose. - Started digoxin 0.125 mg daily - Continue Spiro 25 mg daily.  - HF team holding beta blocker for now.  - CM consulted for assistance with medications.   Acute upper extremity DVTs with bilateral PE's: No RV strain. ANA, ANCA, RF negative.  - Transitioned heparin > eliquis 8/20, but went back to heparin in anticipation of Bx 8/23. No further biopsy planned, will transition back to eliquis per pharmacy.  - Appreciate hematology input. LN Bx performed 8/23, results pending. - Will need mammograms performed as an outpatient. - Hypercoagulability panel results negative so far: anticardiolipin Ab's neg, no Factor V Leiden mutation found, beta-2-glycoproteins negative, lupus anticoagulant negative, protein S total is elevated with activity at 140% (the ULN), protein C total and activity are normal, antithrombin III function borderline low at 72% (nl 75-120%) - dRVVT prolonged at 59sec but corrects with mix with normal plasma (Mix 44.4sec wnl). ?factor deficiency.   Community-acquired pneumonia: Bilateral pulmonary infiltrates on imaging. PCT reassuring. No sepsis.  - Continued meropenem through 8/23. Resolved.   Lower extremity edema: Due to CHF as above, with resultant bullae, now wounds.  - WOC consulted, conservative wound care recommended.   T2DM: Also a new diagnosis with HbA1c 7.1%.  - Will have diabetes educator discuss lifestyle modifications prior to discharge.  Too much currently happening. Would favor holding medications pending  follow up.   Tobacco use: Congratulated on cessation. Encouraged to continue.   DVT prophylaxis: IV heparin Code Status: Full Family Communication: Fiance and sister at bedside Disposition Plan: DC to home 8/25 if remains stable. Plan to call patient with finalized pathology report as soon as this is available.  - Has follow up 06/04/2017 at 01:00 PM in Internal Medicine [name], [place])  - Will also need follow up with [place] if malignancy is identified.  Consultants:   HF team  Hematology, Dr. Beryle Beams  ENT, Dr. Wilburn Cornelia  IR, Dr. Earleen Newport  Procedures:  [facility] 05/27/17  Mid RCA lesion, 20 %stenosed.  Dist LAD lesion, 40 %stenosed. RHC hemodynamics AO = 123/89 (104) LV= 124/13 RA = 7 RV = 42/7 PA = 45/20 (29) PCW = 11 Fick cardiac output/index = 5.4/2.7 PVR = 3.3 WU SVR 1448 FA sat = 95% PA sat = 65%, 68%  Antimicrobials:  Augmentin > meropenem through 8/23.  Subjective: Breathing improved, no chest pain. Has vaginal irritation, suspicious for yeast infection with abx.   Objective: Vitals:   05/30/17 1029 05/30/17 1248 05/30/17 2036 05/31/17 0555  BP:  110/76 125/82 107/61  Pulse: (!) 117 (!) 115 (!) 122 (!) 117  Resp:  18 20 18   Temp:  (!) 97.4 F (36.3 C) 98.5 F (36.9 C) 98.7 F (37.1 C)  TempSrc:  Oral Oral Oral  SpO2:  99% 98% 99%  Weight:    83.3 kg (183 lb 11.2 oz)  Height:       Filed Weights   05/29/17 0427 05/30/17 0606 05/31/17 0555  Weight: 85.5 kg (188 lb 6.4 oz) 84.1 kg (185 lb 8 oz) 83.3 kg (183 lb 11.2 oz)   General exam: 51 y.o. female in no distress  Respiratory system: Non-labored breathing. Diminished but clear without crackles or wheezes. Cardiovascular system: Regular tachycardia. No murmur, rub, or gallop. No JVD. Gastrointestinal system: Abdomen soft, non-tender, non-distended, with normoactive bowel sounds. No organomegaly or masses felt. Central nervous system: Alert and oriented. No focal neurological  deficits. Extremities: LUE diffusely edematous, bilateral LE's with 1+dependent edema. Skin: Lateral ankles bilaterally with unroofed blisters with serous discharge, no odor, minimal surrounding erythema. Small left supraclavicular puncture site c/d/i with bandaid.  Psychiatry: Judgement and insight appear normal. Mood & affect appropriate.   CBC:  Recent Labs Lab 05/27/17 0255 05/28/17 0509 05/29/17 0421 05/30/17 0445 05/31/17 0732  WBC 17.9* 15.5* 15.0* 12.0* 13.5*  NEUTROABS  --  11.2*  --   --   --   HGB 12.9 12.3 13.1 12.0 12.7  HCT 41.2 39.8 42.5 40.6 41.2  MCV 86.4 87.9 86.9 88.1 87.8  PLT 577* 553* 629* 582* 469*   Basic Metabolic Panel:  Recent Labs Lab 05/26/17 0408 05/27/17 0255 05/28/17 0509 05/29/17 0421 05/30/17 0445 05/31/17 0732  NA 134* 135 134* 135 135 135  K 3.4* 4.1 5.1 4.4 4.6 4.3  CL 91* 94* 101 101 102 100*  CO2 30 31 27 25 24 24   GLUCOSE 191* 165* 157* 128* 111* 114*  BUN 20 17 13 8 9 10   CREATININE 0.87 0.83 0.67 0.59 0.55 0.61  CALCIUM 8.2* 8.5* 8.5* 8.8* 9.1 9.1  MG 2.0 2.2 2.2 2.0  --  2.0   GFR: Estimated Creatinine Clearance: 93.1 mL/min (by C-G formula based on SCr of 0.61 mg/dL).  Liver Function Tests:  Recent Labs Lab 05/25/17 0520 05/28/17 0509  AST 40 33  ALT 22 17  ALKPHOS 76 67  BILITOT 1.6* 1.0  PROT 7.3 6.5  ALBUMIN 2.3* 2.1*   Cardiac Enzymes:  Recent Labs Lab 05/28/17 0509  CKTOTAL 61   Urine analysis:    Component Value Date/Time   COLORURINE YELLOW 05/21/2017 2320   APPEARANCEUR CLOUDY (A) 05/21/2017 2320   LABSPEC 1.010 05/21/2017 2320   PHURINE 5.5 05/21/2017 2320   GLUCOSEU NEGATIVE 05/21/2017 2320   HGBUR SMALL (A) 05/21/2017 2320   BILIRUBINUR NEGATIVE 05/21/2017 2320   BILIRUBINUR moderate 10/20/2014 1654   KETONESUR NEGATIVE 05/21/2017 2320   PROTEINUR 30 (A) 05/21/2017 2320   UROBILINOGEN 2.0 10/20/2014 1654   NITRITE NEGATIVE 05/21/2017 2320   LEUKOCYTESUR SMALL (A) 05/21/2017 2320    Radiology Studies: Ir US Guide Bx Asp/drain  Result Date: 05/30/2017 INDICATION: 51 year old female with a history of bilateral upper extremity DVT. CT demonstrates questionable lymph node in the left supraclavicular region and she has been referred for a biopsy. EXAM: ULTRASOUND-GUIDED BIOPSY LEFT SUPRACLAVICULAR LYMPH NODE MEDICATIONS: None. ANESTHESIA/SEDATION: None FLUOROSCOPY TIME:  None COMPLICATIONS: None PROCEDURE: Informed written consent was obtained from the patient after a thorough discussion of the procedural risks, benefits and alternatives. All questions were addressed. Maximal Sterile Barrier Technique was utilized including caps, mask, sterile gowns, sterile gloves, sterile drape, hand hygiene and skin antiseptic. A timeout was performed prior to the initiation of the procedure. Patient positioned supine position on the ultrasound stretcher. Ultrasound imaging of the left supraclavicular region performed with images stored and sent to PACs. The patient is prepped and draped in the usual sterile fashion. The skin and subcutaneous tissues were generously infiltrated 1% lidocaine for local anesthesia. A small stab incision was made with 11 blade scalpel. Using ultrasound guidance, multiple 18 gauge core biopsy of lymph node in the left supraclavicular region acquired. Tissue specimen placed in the saline. Final image was stored. Patient tolerated the procedure well and remained hemodynamically stable throughout. No complications were encountered and no significant blood loss. FINDINGS: Ultrasound survey demonstrates lymph node in the left supraclavicular region which likely correlates to the CT. The lymph node is not enlarged, and maintains typical architecture. IMPRESSION: Status post ultrasound-guided biopsy of left supraclavicular lymph node. Tissue specimen sent to pathology for complete histopathologic analysis. Signed, Dulcy Fanny. Earleen Newport, DO [facility]  [facility] Radiology Electronically Signed   By: Corrie Mckusick D.O.   On: 05/30/2017 09:57    LOS: 10 days   Time spent: 25 minutes.  Vance Gather, MD [facility] Pager [phone]  If 7PM-7AM, please contact night-coverage [email or web address] Password [email or web address] 05/31/2017, 11:11 AM

## 2017-06-01 DIAGNOSIS — I82A11 Acute embolism and thrombosis of right axillary vein: Secondary | ICD-10-CM

## 2017-06-01 LAB — BASIC METABOLIC PANEL
ANION GAP: 8 (ref 5–15)
BUN: 9 mg/dL (ref 6–20)
CHLORIDE: 101 mmol/L (ref 101–111)
CO2: 27 mmol/L (ref 22–32)
CREATININE: 0.59 mg/dL (ref 0.44–1.00)
Calcium: 9.5 mg/dL (ref 8.9–10.3)
GFR calc non Af Amer: >60 mL/min (ref >60)
GLUCOSE: 114 mg/dL — AB (ref 65–99)
Potassium: 4.7 mmol/L (ref 3.5–5.1)
Sodium: 136 mmol/L (ref 135–145)

## 2017-06-01 LAB — CBC
HCT: 40.3 % (ref 36.0–46.0)
HEMOGLOBIN: 11.9 g/dL — AB (ref 12.0–15.0)
MCH: 26.2 pg (ref 26.0–34.0)
MCHC: 29.5 g/dL — AB (ref 30.0–36.0)
MCV: 88.6 fL (ref 78.0–100.0)
Platelets: 636 10*3/uL — ABNORMAL HIGH (ref 150–400)
RBC: 4.55 MIL/uL (ref 3.87–5.11)
RDW: 16.6 % — ABNORMAL HIGH (ref 11.5–15.5)
WBC: 8.9 10*3/uL (ref 4.0–10.5)

## 2017-06-01 MED ORDER — SACUBITRIL-VALSARTAN 49-51 MG PO TABS: 1.0000 | ORAL_TABLET | Freq: Two times a day (BID) | ORAL | 0 refills | Status: DC

## 2017-06-01 MED ORDER — CARVEDILOL 3.125 MG PO TABS: 3.1250 mg | ORAL_TABLET | Freq: Two times a day (BID) | ORAL | 0 refills | Status: DC

## 2017-06-01 MED ORDER — MAGNESIUM OXIDE 400 (241.3 MG) MG PO TABS: 400.0000 mg | ORAL_TABLET | Freq: Two times a day (BID) | ORAL | 0 refills | Status: DC

## 2017-06-01 MED ORDER — APIXABAN 5 MG PO TABS: ORAL_TABLET | ORAL | 0 refills | Status: DC

## 2017-06-01 MED ORDER — DIGOXIN 125 MCG PO TABS: 0.1250 mg | ORAL_TABLET | Freq: Every day | ORAL | 0 refills | Status: DC

## 2017-06-01 MED ORDER — FUROSEMIDE 20 MG PO TABS: 20.0000 mg | ORAL_TABLET | Freq: Every day | ORAL | 0 refills | Status: DC

## 2017-06-01 MED ORDER — SPIRONOLACTONE 25 MG PO TABS: 25.0000 mg | ORAL_TABLET | Freq: Every day | ORAL | 0 refills | Status: DC

## 2017-06-01 NOTE — Discharge Instructions (Signed)
Information on my medicine - ELIQUIS (apixaban)  This medication education was reviewed with me or my healthcare representative as part of my discharge preparation.  The pharmacist that spoke with me during my hospital stay was:  Wayland Salinas, [facility]  Why was Eliquis prescribed for you? Eliquis was prescribed to treat blood clots that may have been found in the veins of your legs (deep vein thrombosis) or in your lungs (pulmonary embolism) and to reduce the risk of them occurring again.  What do You need to know about Eliquis ? The starting dose is 10 mg (two 5 mg tablets) taken TWICE daily for the FIRST SEVEN (7) DAYS, then on (enter date)  06/08/2017  the dose is reduced to ONE 5 mg tablet taken TWICE daily.  Eliquis may be taken with or without food.   Try to take the dose about the same time in the morning and in the evening. If you have difficulty swallowing the tablet whole please discuss with your pharmacist how to take the medication safely.  Take Eliquis exactly as prescribed and DO NOT stop taking Eliquis without talking to the doctor who prescribed the medication.  Stopping may increase your risk of developing a new blood clot.  Refill your prescription before you run out.  After discharge, you should have regular check-up appointments with your healthcare provider that is prescribing your Eliquis.    What do you do if you miss a dose? If a dose of ELIQUIS is not taken at the scheduled time, take it as soon as possible on the same day and twice-daily administration should be resumed. The dose should not be doubled to make up for a missed dose.  Important Safety Information A possible side effect of Eliquis is bleeding. You should call your healthcare provider right away if you experience any of the following: ? Bleeding from an injury or your nose that does not stop. ? Unusual colored urine (red or dark brown) or unusual colored stools (red or  black). ? Unusual bruising for unknown reasons. ? A serious fall or if you hit your head (even if there is no bleeding).  Some medicines may interact with Eliquis and might increase your risk of bleeding or clotting while on Eliquis. To help avoid this, consult your healthcare provider or pharmacist prior to using any new prescription or non-prescription medications, including herbals, vitamins, non-steroidal anti-inflammatory drugs (NSAIDs) and supplements.  This website has more information on Eliquis (apixaban): http://[email or web address].com/eliquis/home

## 2017-06-01 NOTE — Progress Notes (Signed)
Advanced Heart Failure Rounding Note   Subjective:    Feels better. Denies SOB or edema. Pending d/c today.   L/RHC 05/27/17  Mid RCA lesion, 20 %stenosed.  Dist LAD lesion, 40 %stenosed. RHC hemodynamics AO = 123/89 (104) LV=   124/13 RA =  7 RV = 42/7 PA = 45/20 (29) PCW = 11 Fick cardiac output/index = 5.4/2.7 PVR = 3.3 WU SVR 1448 FA sat = 95% PA sat = 65%, 68%  CT chest 05/27/17 (Personally reviewed)  IMPRESSION: 1. Bilateral pulmonary emboli in the lower lobes and perhaps the right upper lobe. No heart strain identified. 2. Bibasilar pulmonary opacities. These opacities could represent pneumonia. Aspiration is possible. Pulmonary infarcts cannot be excluded given the pulmonary emboli. Recommend follow-up to resolution. 3. Coronary artery calcifications. 4. Mildly enlarged nodes and increased attenuation in the fat of the left axilla. This is a nonspecific finding. Recommend clinical correlation and short-term follow-up to resolution. 5. Shotty nodes in the mediastinum may be reactive given the findings in the lungs. 6. Small pleural effusions.  Small pericardial effusion.   Objective:   Weight Range:  Vital Signs:   Temp:  [99.1 F (37.3 C)-99.3 F (37.4 C)] 99.3 F (37.4 C) (08/25 0504) Pulse Rate:  [103-115] 109 (08/25 0950) Resp:  [19-20] 19 (08/25 0504) BP: (118-128)/(74-87) 128/87 (08/25 0504) SpO2:  [95 %-96 %] 96 % (08/25 0504) Weight:  [82.6 kg (182 lb 1.6 oz)] 82.6 kg (182 lb 1.6 oz) (08/25 0504) Last BM Date: 05/31/17  Weight change: Filed Weights   05/30/17 0606 05/31/17 0555 06/01/17 0504  Weight: 84.1 kg (185 lb 8 oz) 83.3 kg (183 lb 11.2 oz) 82.6 kg (182 lb 1.6 oz)    Intake/Output:   Intake/Output Summary (Last 24 hours) at 06/01/17 1208 Last data filed at 06/01/17 0900  Gross per 24 hour  Intake          1885.23 ml  Output             2751 ml  Net          -865.77 ml     Physical Exam: General:  Sitting in bed . No  resp difficulty HEENT: normal Neck: supple. JVP 6. Carotids 2+ bilat; no bruits. No lymphadenopathy or thryomegaly appreciated. Cor: PMI nondisplaced. Regular rate & rhythm. No rubs, gallops or murmurs. Lungs: clear Abdomen: soft, nontender, nondistended. No hepatosplenomegaly. No bruits or masses. Good bowel sounds. Extremities: no cyanosis, clubbing, rash, edema + ankle wounds Neuro: alert & orientedx3, cranial nerves grossly intact. moves all 4 extremities w/o difficulty. Affect pleasant   Telemetry: Personally reviewed, Sinus tach 100-110s    Labs: Basic Metabolic Panel:  Recent Labs Lab 05/26/17 0408 05/27/17 0255 05/28/17 0509 05/29/17 0421 05/30/17 0445 05/31/17 0732 06/01/17 0440  NA 134* 135 134* 135 135 135 136  K 3.4* 4.1 5.1 4.4 4.6 4.3 4.7  CL 91* 94* 101 101 102 100* 101  CO2 30 31 27 25 24 24 27   GLUCOSE 191* 165* 157* 128* 111* 114* 114*  BUN 20 17 13 8 9 10 9   CREATININE 0.87 0.83 0.67 0.59 0.55 0.61 0.59  CALCIUM 8.2* 8.5* 8.5* 8.8* 9.1 9.1 9.5  MG 2.0 2.2 2.2 2.0  --  2.0  --     Liver Function Tests:  Recent Labs Lab 05/28/17 0509  AST 33  ALT 17  ALKPHOS 67  BILITOT 1.0  PROT 6.5  ALBUMIN 2.1*   No results for input(s): LIPASE,  AMYLASE in the last 168 hours. No results for input(s): AMMONIA in the last 168 hours.  CBC:  Recent Labs Lab 05/28/17 0509 05/29/17 0421 05/30/17 0445 05/31/17 0732 06/01/17 0440  WBC 15.5* 15.0* 12.0* 13.5* 8.9  NEUTROABS 11.2*  --   --   --   --   HGB 12.3 13.1 12.0 12.7 11.9*  HCT 39.8 42.5 40.6 41.2 40.3  MCV 87.9 86.9 88.1 87.8 88.6  PLT 553* 629* 582* 590* 636*    Cardiac Enzymes:  Recent Labs Lab 05/28/17 0509  CKTOTAL 61    BNP: BNP (last 3 results)  Recent Labs  05/21/17 2100  BNP 2,921.6*    ProBNP (last 3 results) No results for input(s): PROBNP in the last 8760 hours.    Other results:  Imaging: No results found.   Medications:     Scheduled Medications: .  apixaban  10 mg Oral BID   Followed by  . [START ON 06/08/2017] apixaban  5 mg Oral BID  . carvedilol  3.125 mg Oral BID WC  . cyclobenzaprine  7.5 mg Oral TID  . digoxin  0.125 mg Oral Daily  . furosemide  20 mg Oral Daily  . guaiFENesin  600 mg Oral BID  . magnesium oxide  400 mg Oral BID  . mupirocin cream   Topical Daily  . omeprazole  20 mg Oral Daily  . sacubitril-valsartan  1 tablet Oral BID  . sodium chloride flush  3 mL Intravenous Q12H  . spironolactone  25 mg Oral Daily    Infusions: . sodium chloride      PRN Medications: sodium chloride, acetaminophen, bisacodyl, gi cocktail, lidocaine (PF), LORazepam, ondansetron (ZOFRAN) IV, ondansetron **OR** [DISCONTINUED] ondansetron (ZOFRAN) IV, senna-docusate, sodium chloride, sodium chloride flush   Assessment:   51 y/o smoke admitted with acute systolic HF with biventricular dysfunction EF 15-20%. Also note to have markedly positive VQ suggestive of PE.   Plan/Discussion:     1. Acute systolic HF with biventricular dysfunction - EF 15-20% by echo with severe RV dysfunction. NICM - Volume status stable on exam.    - Continue lasix 20 mg daily. Can take extra as neeed. Discussed need for daily weights and reviewed use of sliding scale diuretics. - Continue entresto 49-51 mg twice a day.    - Continue Digoxin 0.125 mg daily - Continue Spiro 25 mg daily.  - No b-blocker yet  -Unable to wear ted hose due to wounds on feet.  - R/LHC 05/27/17 with mild, non-obstructive CAD and well-compensated hemodynamics.  2. Acute RIJ/sublcavian DVT with bilateral PE  - VQ with bilateral perfusion defects  - CT chest 05/27/17 with bilateral PE but no clot in main PAs. No RV strain - On Eliquis now  -   3. Tobacco use - Quit smoking prior to admit.   No change.   4. Bilateral pulmonary infiltrates.  - completed course of meropenem for PNA   5. ? Breast CA - IR has seen. S/P lymph node biopsy 05/30/17. Awaiting pathology. I will  d/w Dr. Lyndon Code on Monday and call her  7. Social -  Network engineer contacted.  No change.   8. bilateral feet wounds - WOC appreciated. Continue conservative wound care.     Dispo: Home today. I will follow her in [facility] Clinic.   Length of Stay: 11  Glori Bickers, MD  06/01/2017, 12:08 PM  Advanced Heart Failure Team Pager [phone] (M-F; 7a - 4p)  Please contact [facility] for  night-coverage after hours (4p -7a ) and weekends on [email or web address]

## 2017-06-01 NOTE — Discharge Summary (Addendum)
Physician Discharge Summary  Kylie Bennett:[id number] DOB: 01-02-1966 DOA: 05/21/2017  PCP: Patient, No Pcp Per  Admit date: 05/21/2017 Discharge date: 06/01/2017  Admitted From: Home Disposition: Home   Recommendations for Outpatient Follow-up:  1. Follow up with PCP, to establish care with Kylie Bennett, [place] 8/28.  2. Follow up with heart failure clinic 9/5 3. Follow up results from lymph node biopsy 8/23. Pending at time of discharge. Patient has no inpatient hospital needs, so is discharged, but is anxious about these results. She opts to be called by the discharging physician as soon as these results are available (likely 8/27) in lieu of waiting for follow up. Will need follow up with Kylie Bennett if malignancy is identified. 4. Please obtain BMP for monitoring.   ADDENDUM: Discussed results with pathologist, Dr. Tresa Bennett. 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 Reportedly, no very specific stains were positive. I've discussed this with the patient and referred her to the [facility]. She has an appointment 8/31 at 12:00 with Dr. Lindi Bennett. Ideally, she would have had a mammogram by that time. I've discussed this with the nurse for Kylie Barrows, FNP as well, with whom she has follow up tomorrow.   Home Health: None Equipment/Devices: None Discharge Condition: Stable CODE STATUS: Full Diet recommendation: Heart healthy  Brief/Interim Summary: Kylie Bennett is a 51 y.o. female with a history of tobacco use (quit July 2018) who presented to the ED for cough, fever, dyspnea on exertion and new bilateral leg swelling. Symptoms had not been improved with augmentin for bronchitis from urgent care. On arrival BNP was elevated at 2921, CXR with cardiomegaly and vascular congestion. ECG showed sinus tachycardia and troponin elevated at 0.48 > 0.51 > 0.46. Cardiology was consulted for new onset heart failure. Echo showed EF 15-20% with grade 3 diastolic dysfunction and  LHC with clean coronaries on 8/20. Cardiac output/index estimated at 5.4/2.7. Diuresis was started and antibiotics were given for CAP. She developed left arm swelling, D-dimer was elevated and CECT of the chest demonstrated bilateral PEs with evidence of pulmonary infarction and clots in left subclavian and right jugular veins. Left axillary lymphadenopathy was noted. Subsequent V/Q scan was high probability for PE. Upper extremity venous duplex demonstrated DVT in the left subclavian and axillary veins. Heparin was started. Due to concern for hypercoagulability, hematology was consulted, recommending left axillary lymph node biopsy and ENT evaluation for occult head and neck CA. ENT performed laryngoscopy 8/21 which showed no evidence of malignancy. Lymph node biopsy was performed 8/23 with no finalized report available at this time. She is stable and will discharge with plan for follow up next week in [place] or Dr. Azucena Freed clinic.  Discharge Diagnoses:  Principal Problem:   Acute CHF (congestive heart failure) (HCC) Active Problems:   Hyponatremia   Community acquired pneumonia of right lower lobe of lung (Kylie Bennett)   Acute systolic heart failure (HCC)   Bilateral pulmonary embolism (HCC)   Axillary adenopathy   Hoarseness of voice   Nonischemic cardiomyopathy (HCC)   Lymph node enlargement  Acute combined systolic and diastolic CHF: Nonischemic cardiomyopathy. EF 15-20% by echo, G3DD, with severe RV dysfunction. Suspect congestive hepatopathy with mildly abnormal LFTs on admission, since resolved.  - Appreciate HF team following this patient. She is stable from their perspective. Will follow up in their clinic. - Appears euvolemic on oral lasix. - Started spironolactone - Started entresto, increased dose. - Started digoxin 0.125 mg daily - Continued Spiro 25 mg daily.  -  HF team holding beta blocker for now.  - CM consulted for assistance with medications. 30-day card for eliquis,  coupon for entresto, and [facility] letter provided.   Acute upper extremity DVTs with bilateral PE's: No RV strain. ANA, ANCA, RF negative.  - Transitioned heparin > eliquis - Appreciate hematology input. LN Bx performed 8/23, results pending. - Will need mammograms performed as an outpatient. - Hypercoagulability panel results negative so far: anticardiolipin Ab's neg, no Factor V Leiden mutation found, beta-2-glycoproteins negative, lupus anticoagulant negative, protein S total is elevated with activity at 140% (the ULN), protein C total and activity are normal, antithrombin III function borderline low at 72% (nl 75-120%) - dRVVT prolonged at 59sec but corrects with mix with normal plasma (Mix 44.4sec wnl). ?factor deficiency. Will follow up with hematology.   Community-acquired pneumonia: Bilateral pulmonary infiltrates on imaging. PCT reassuring. No sepsis.  - Continued meropenem through 8/23. Resolved.   Lower extremity edema: Due to CHF as above, with resultant bullae, now wounds.  - WOC consulted, conservative wound care recommended.   Elevated HbA1c 7.1%:   - Discussed lifestyle modifications.  - Recommend recheck and Tx if still elevated.  Tobacco use: Congratulated on cessation. Encouraged to continue.   Discharge Instructions  Allergies as of 06/01/2017      Reactions   Unasyn [ampicillin-sulbactam Sodium] Other (See Comments)   Pt gets hot/flushed with infusion       Medication List    STOP taking these medications   amoxicillin-clavulanate 875-125 MG tablet Commonly known as:  AUGMENTIN     TAKE these medications   apixaban 5 MG Tabs tablet Commonly known as:  ELIQUIS take 10mg  (2 tabs) po BID for 7 days, then 5mg  (1 tab) po BID   carvedilol 3.125 MG tablet Commonly known as:  COREG Take 1 tablet (3.125 mg total) by mouth 2 (two) times daily with a meal.   digoxin 0.125 MG tablet Commonly known as:  LANOXIN Take 1 tablet (0.125 mg total) by mouth daily.    fluticasone 50 MCG/ACT nasal spray Commonly known as:  FLONASE Place 2 sprays into both nostrils daily as needed for allergies.   furosemide 20 MG tablet Commonly known as:  LASIX Take 1 tablet (20 mg total) by mouth daily.   magnesium oxide 400 (241.3 Mg) MG tablet Commonly known as:  MAG-OX Take 1 tablet (400 mg total) by mouth 2 (two) times daily.   omeprazole 20 MG capsule Commonly known as:  PRILOSEC Take 20 mg by mouth daily.   sacubitril-valsartan 49-51 MG Commonly known as:  ENTRESTO Take 1 tablet by mouth 2 (two) times daily.   spironolactone 25 MG tablet Commonly known as:  ALDACTONE Take 1 tablet (25 mg total) by mouth daily.            Discharge Care Instructions        Start     Ordered   06/01/17 0000  apixaban (ELIQUIS) 5 MG TABS tablet     06/01/17 0737   06/01/17 0000  carvedilol (COREG) 3.125 MG tablet  2 times daily with meals     06/01/17 0737   06/01/17 0000  digoxin (LANOXIN) 0.125 MG tablet  Daily     06/01/17 0737   06/01/17 0000  furosemide (LASIX) 20 MG tablet  Daily     06/01/17 0737   06/01/17 0000  magnesium oxide (MAG-OX) 400 (241.3 Mg) MG tablet  2 times daily     06/01/17 0737   06/01/17 0000  sacubitril-valsartan (ENTRESTO) 49-51 MG  2 times daily     06/01/17 0737   06/01/17 0000  spironolactone (ALDACTONE) 25 MG tablet  Daily     06/01/17 0737     Follow-up [place]. Go on 06/04/2017.   Specialty:  Internal Medicine Why:  at 1:00. Please bring your ID and arrive 15 minutes early.  Contact information: [place] [phone]       [facility] Follow up on 06/12/2017.   Specialty:  Cardiology Why:  at 10:00 am for hospital follow up. Garage code [id number].  Contact information: [address] [phone] [place] [phone]         Allergies  Allergen Reactions   . Unasyn [Ampicillin-Sulbactam Sodium] Other (See Comments)    Pt gets hot/flushed with infusion     Consultations:  HF team, Dr. Haroldine Laws   Hematology, Dr. Beryle Beams  ENT, Dr. Wilburn Cornelia  IR, Dr. Earleen Newport  Procedures/Studies: Dg Chest 2 View  Result Date: 05/23/2017 CLINICAL DATA:  Shortness of breath EXAM: CHEST  2 VIEW COMPARISON:  05/21/2017 FINDINGS: Small bilateral effusions.  Right lung grossly clear. Persistent consolidation in the left lower lobe with streaky opacity at the lingula. No significant change. Borderline cardiomegaly. No pneumothorax. IMPRESSION: 1. Small bilateral pleural effusions 2. No significant interval change in lingular and left lower lobe consolidation Electronically Signed   By: Donavan Foil M.D.   On: 05/23/2017 19:03   Dg Chest 2 View  Result Date: 05/21/2017 CLINICAL DATA:  Shortness of breath EXAM: CHEST  2 VIEW COMPARISON:  None. FINDINGS: Small bilateral pleural effusions. Patchy atelectasis at the right base. Partial consolidation at the left lung base. Cardiomegaly with central vascular congestion and mild perihilar edema. No pneumothorax. IMPRESSION: 1. Small bilateral pleural effusions with left lung base atelectasis or pneumonia 2. Cardiomegaly with central vascular congestion and mild perihilar edema Electronically Signed   By: Donavan Foil M.D.   On: 05/21/2017 22:00   Ct Soft Tissue Neck W Contrast  Result Date: 05/28/2017 CLINICAL DATA:  51 year old female with bilateral pulmonary emboli. Query jugular DVT. EXAM: CT NECK WITH CONTRAST TECHNIQUE: Multidetector CT imaging of the neck was performed using the standard protocol following the bolus administration of intravenous contrast. CONTRAST:  30mL ISOVUE-300 IOPAMIDOL (ISOVUE-300) INJECTION 61% COMPARISON:  Chest CTA 05/26/2017. FINDINGS: Pharynx and larynx: Laryngeal soft tissue contours are within normal limits. There is benign appearing asymmetry of the right laryngeal cartilage on series  3, image 73. This might be developmental or posttraumatic. Pharyngeal soft tissue contours are within normal limits. No parapharyngeal or retropharyngeal space abnormality or inflammation. Salivary glands: Sublingual space, submandibular glands and parotid glands are within normal limits. Thyroid: Thyroid parenchyma appears normal. Lymph nodes: Abnormal indistinct soft tissue at the left thoracic inlet best seen on coronal series 7, image 71 encompasses 21 x 26 x 27 mm (AP by transverse by CC) is associated with nearby a venous thrombosis (see below) and indistinct left level 4 lymph and right peritracheal node enlargement (individual nodes up to 13 mm short axis). The bilateral level 1 through level 3 lymph node stations are within normal limits. Vascular: No inflammatory changes about the right carotid space, but there is thrombosis of the entire right internal jugular vein (coronal image 71). There is associated thrombus within the right common facial vein (series 3, image 61), the right IJ bulb,  and the intracranial right sigmoid and transverse venous sinuses (series 8, image 43). Nonocclusive thrombus within the right IJ extends to the right subclavian vein confluence and right innominate vein (series 3, image 111). The right subclavian vein otherwise appears normal. Furthermore, there is patchy thrombus throughout the left subclavian vein associated with the abnormal left thoracic inlet soft tissue (coronal image 71). The left IJ and left sigmoid and transverse sinuses are patent. Major arterial structures in the neck and at the skullbase are patent. Limited intracranial: Negative. Visualized orbits: Negative. Mastoids and visualized paranasal sinuses: Clear except for right maxillary sinus mucous retention cysts. Skeleton: Poor posterior maxillary dentition on the right. Otherwise no acute or suspicious osseous lesion identified in the neck or at the skullbase. Upper chest: Partially visible increased left  axillary and mediastinal lymph nodes. Lung apices are clear except for mild dependent ground-glass opacity on the right. Small left greater than right layering pleural effusions. IMPRESSION: 1. Positive for extensive venous thrombosis in the right head and neck: Right sigmoid dural sinus thrombosis, right IJ thrombosis, right facial vein thrombosis. Patchy thrombus also in the left subclavian vein associated with # 2. 2. Indistinct soft tissue mass at the left thoracic inlet up to 2.7 cm most resembles a conglomeration of abnormal lymph nodes. No other neck mass or lymphadenopathy. 3. Consider Lymphoma and metastatic lymphadenopathy, and note also the presence of abnormal left axillary lymph nodes on the recent chest CTA. Lemierre's syndrome (Fusobacterium necrophorum throat infection) was also considered but felt unlikely given the absence of any neck inflammatory changes outside of the left thoracic inlet. 4. Study discussed by telephone with Dr. Nita Sells on 05/28/2017 at 0915 hours. Electronically Signed   By: Genevie Ann M.D.   On: 05/28/2017 09:24   Ct Chest W Contrast  Addendum Date: 05/26/2017   ADDENDUM REPORT: 05/26/2017 10:25 ADDENDUM: The findings were called to Dr. Verlon Au. The patient had an ultrasound yesterday which is not available for my review. After discussing with the patient's referring physician, I suspect DVT in the right jugular vein which is expanded and unopacified. I also suspect the increased attenuation of fat in the left axilla, which in retrospect extends along the left subclavian vein, is likely secondary to a left subclavian DVT. This could also explain the mild adenopathy in the left axilla. No anatomic causes for the DVTs is identified. Electronically Signed   By: Dorise Bullion III M.D   On: 05/26/2017 10:25   Result Date: 05/26/2017 CLINICAL DATA:  Concern for pneumonia. EXAM: CT CHEST WITH CONTRAST TECHNIQUE: Multidetector CT imaging of the chest was performed  during intravenous contrast administration. CONTRAST:  27mL ISOVUE-300 IOPAMIDOL (ISOVUE-300) INJECTION 61% COMPARISON:  Multiple chest x-rays since May 21, 2017 FINDINGS: Cardiovascular: Coronary artery calcifications are identified. Cardiomegaly. The thoracic aorta is unremarkable. This is not a study tailored to evaluate for pulmonary emboli. However, there are emboli located in the lower lobe pulmonary arteries. Evaluation of the proximal right upper lobe pulmonary artery is not well assessed due to streak artifact off the SVC but an embolus is not excluded. No emboli in the main pulmonary arteries. The left ventricle demonstrates a diameter of 7.3 cm and the right ventricle demonstrates a diameter of 3.9 cm, with a ratio of well less than 0.9. No evidence of heart strain. Mediastinum/Nodes: Prominent nodes are seen in the left axilla, asymmetric to the right with a E representative node on series 5, image 41 measuring 12 mm. There is mild increased  attenuation of fat of the left axilla is while. Shotty nodes are seen in the mediastinum without overt adenopathy. Tiny effusions are seen in the pleural spaces. A small pericardial effusion is noted posteriorly. The esophagus is normal. The thyroid is unremarkable. Lungs/Pleura: The central airways are normal. No pneumothorax. Bibasilar infiltrates are noted in in medial aspect of the right lower lobe and more diffusely in the left lower lobe. No other abnormalities are seen within the lungs. Upper Abdomen: Cholelithiasis. No other abnormalities in the upper abdomen. Musculoskeletal: No chest wall abnormality. No acute or significant osseous findings. IMPRESSION: 1. Bilateral pulmonary emboli in the lower lobes and perhaps the right upper lobe. No heart strain identified. 2. Bibasilar pulmonary opacities. These opacities could represent pneumonia. Aspiration is possible. Pulmonary infarcts cannot be excluded given the pulmonary emboli. Recommend follow-up to  resolution. 3. Coronary artery calcifications. 4. Mildly enlarged nodes and increased attenuation in the fat of the left axilla. This is a nonspecific finding. Recommend clinical correlation and short-term follow-up to resolution. 5. Shotty nodes in the mediastinum may be reactive given the findings in the lungs. 6. Small pleural effusions.  Small pericardial effusion. Electronically Signed: By: Dorise Bullion III M.D On: 05/26/2017 10:08   Nm Pulmonary Perf And Vent  Result Date: 05/23/2017 CLINICAL DATA:  Positive D-dimer suspect pulmonary embolus EXAM: NUCLEAR MEDICINE VENTILATION - PERFUSION LUNG SCAN TECHNIQUE: Ventilation images were obtained in multiple projections using inhaled aerosol Tc-57m DTPA. Perfusion images were obtained in multiple projections after intravenous injection of Tc-26m MAA. RADIOPHARMACEUTICALS:  30 mCi Technetium-70m DTPA aerosol inhalation and 4 mCi Technetium-81m MAA IV COMPARISON:  Chest radiograph 05/23/2017 FINDINGS: Ventilation: Large defect in the left lower lobe, corresponding to radiographic abnormality. Perfusion: At least 2 large peripheral defects in the apical and lateral aspect of the right lung with relatively normal perfusion and no corresponding opacity on radiograph. Large matched defect in the left lower lobe as well. IMPRESSION: Findings are consistent with high probability for pulmonary embolus. Critical Value/emergent results were called by telephone at the time of interpretation on 05/23/2017 at 7:14 pm to Dr. Lovey Newcomer, who verbally acknowledged these results. Electronically Signed   By: Donavan Foil M.D.   On: 05/23/2017 19:14   Ir US Guide Bx Asp/drain  Result Date: 05/30/2017 INDICATION: 51 year old female with a history of bilateral upper extremity DVT. CT demonstrates questionable lymph node in the left supraclavicular region and she has been referred for a biopsy. EXAM: ULTRASOUND-GUIDED BIOPSY LEFT SUPRACLAVICULAR LYMPH NODE MEDICATIONS: None.  ANESTHESIA/SEDATION: None FLUOROSCOPY TIME:  None COMPLICATIONS: None PROCEDURE: Informed written consent was obtained from the patient after a thorough discussion of the procedural risks, benefits and alternatives. All questions were addressed. Maximal Sterile Barrier Technique was utilized including caps, mask, sterile gowns, sterile gloves, sterile drape, hand hygiene and skin antiseptic. A timeout was performed prior to the initiation of the procedure. Patient positioned supine position on the ultrasound stretcher. Ultrasound imaging of the left supraclavicular region performed with images stored and sent to PACs. The patient is prepped and draped in the usual sterile fashion. The skin and subcutaneous tissues were generously infiltrated 1% lidocaine for local anesthesia. A small stab incision was made with 11 blade scalpel. Using ultrasound guidance, multiple 18 gauge core biopsy of lymph node in the left supraclavicular region acquired. Tissue specimen placed in the saline. Final image was stored. Patient tolerated the procedure well and remained hemodynamically stable throughout. No complications were encountered and no significant blood loss. FINDINGS: Ultrasound  survey demonstrates lymph node in the left supraclavicular region which likely correlates to the CT. The lymph node is not enlarged, and maintains typical architecture. IMPRESSION: Status post ultrasound-guided biopsy of left supraclavicular lymph node. Tissue specimen sent to pathology for complete histopathologic analysis. Signed, Dulcy Fanny. Earleen Newport, DO [facility] Electronically Signed   By: Corrie Mckusick D.O.   On: 05/30/2017 09:57   Dg Chest Port 1 View  Result Date: 05/26/2017 CLINICAL DATA:  Aspiration into airway EXAM: PORTABLE CHEST 1 VIEW COMPARISON:  Three days ago FINDINGS: Left lower lobe consolidation is unchanged. No visible pleural fluid. Mildly low lung volumes. Stable  generous heart size. IMPRESSION: Unchanged left lower lobe pneumonia. Electronically Signed   By: Monte Fantasia M.D.   On: 05/26/2017 07:29   L/RHC 05/27/17  Mid RCA lesion, 20 %stenosed.  Dist LAD lesion, 40 %stenosed. RHC hemodynamics AO = 123/89 (104) LV= 124/13 RA = 7 RV = 42/7 PA = 45/20 (29) PCW = 11 Fick cardiac output/index = 5.4/2.7 PVR = 3.3 WU SVR 1448 FA sat = 95% PA sat = 65%, 68%  Subjective: Feels well, ready to go home. Nervous about biopsy results.   Discharge Exam: BP 128/87 (BP Location: Right Arm)   Pulse (!) 103   Temp 99.3 F (37.4 C) (Oral)   Resp 19   Ht 5\' 8"  (1.727 m)   Wt 82.6 kg (182 lb 1.6 oz)   SpO2 96%   BMI 27.69 kg/m   General: Pt is alert, awake, not in acute distress Cardiovascular: RRR, no murmur, gallop, or JVD or edema. Respiratory: Nonlabored, diminished but clear Abdominal: Soft, NT, ND, bowel sounds + Ext: LUE diffusely edematous, bilateral LE's with 1+dependent edema. Skin: Lateral ankles bilaterally with unroofed blisters with serous discharge, no odor, minimal surrounding erythema. Dressing c/d/i. Small left supraclavicular puncture site c/d/i   Labs: BNP (last 3 results)  Recent Labs  05/21/17 2100  BNP 1,610.9*   Basic Metabolic Panel:  Recent Labs Lab 05/26/17 0408 05/27/17 0255 05/28/17 0509 05/29/17 0421 05/30/17 0445 05/31/17 0732 06/01/17 0440  NA 134* 135 134* 135 135 135 136  K 3.4* 4.1 5.1 4.4 4.6 4.3 4.7  CL 91* 94* 101 101 102 100* 101  CO2 30 31 27 25 24 24 27   GLUCOSE 191* 165* 157* 128* 111* 114* 114*  BUN 20 17 13 8 9 10 9   CREATININE 0.87 0.83 0.67 0.59 0.55 0.61 0.59  CALCIUM 8.2* 8.5* 8.5* 8.8* 9.1 9.1 9.5  MG 2.0 2.2 2.2 2.0  --  2.0  --    Liver Function Tests:  Recent Labs Lab 05/28/17 0509  AST 33  ALT 17  ALKPHOS 67  BILITOT 1.0  PROT 6.5  ALBUMIN 2.1*   CBC:  Recent Labs Lab 05/28/17 0509 05/29/17 0421 05/30/17 0445 05/31/17 0732 06/01/17 0440  WBC 15.5*  15.0* 12.0* 13.5* 8.9  NEUTROABS 11.2*  --   --   --   --   HGB 12.3 13.1 12.0 12.7 11.9*  HCT 39.8 42.5 40.6 41.2 40.3  MCV 87.9 86.9 88.1 87.8 88.6  PLT 553* 629* 582* 590* 636*   Cardiac Enzymes:  Recent Labs Lab 05/28/17 0509  CKTOTAL 61   Urinalysis    Component Value Date/Time   COLORURINE YELLOW 05/21/2017 2320   APPEARANCEUR CLOUDY (A) 05/21/2017 2320   LABSPEC 1.010 05/21/2017 2320   PHURINE 5.5 05/21/2017 2320   GLUCOSEU NEGATIVE 05/21/2017 2320   HGBUR SMALL (  A) 05/21/2017 2320   BILIRUBINUR NEGATIVE 05/21/2017 2320   BILIRUBINUR moderate 10/20/2014 1654   KETONESUR NEGATIVE 05/21/2017 2320   PROTEINUR 30 (A) 05/21/2017 2320   UROBILINOGEN 2.0 10/20/2014 1654   NITRITE NEGATIVE 05/21/2017 2320   LEUKOCYTESUR SMALL (A) 05/21/2017 2320    Time coordinating discharge: Approximately 40 minutes  Vance Gather, MD  [facility] 06/01/2017, 9:18 AM Pager [phone]

## 2017-06-01 NOTE — Care Management Note (Signed)
Case Management Note  Patient Details  Name: Kylie Bennett MRN: [id number] Date of Birth: 03/07/1966  Subjective/Objective:                 Patient provided with 30 day cards for Entresto and Eliquis, and [facility] letter. Patient verbalized understanding for use. F/U apt made. Patient understands to go and that she can use [facility] pharmacy after 30 day supply from [facility] runs out as long as she goes to F/U apt at [name].    Action/Plan:  No other CM needs identified. Expected Discharge Date:  06/01/17               Expected Discharge Plan:  Home/Self Care  In-House Referral:     Discharge planning Services  CM Consult, [place] Program, Follow-up appt scheduled, Medication Assistance  Post Acute Care Choice:    Choice offered to:     DME Arranged:    DME Agency:     HH Arranged:    [place] Agency:     Status of Service:  Completed, signed off  If discussed at [employer], dates discussed:    Additional Comments:  Carles Collet, RN 06/01/2017, 9:48 AM

## 2017-06-03 ENCOUNTER — Telehealth: Payer: Self-pay | Admitting: Hematology and Oncology

## 2017-06-03 ENCOUNTER — Telehealth: Payer: Self-pay

## 2017-06-03 ENCOUNTER — Encounter: Payer: Self-pay | Admitting: Hematology and Oncology

## 2017-06-03 LAB — PROTHROMBIN GENE MUTATION

## 2017-06-03 NOTE — Telephone Encounter (Signed)
Appt has been scheduled for the pt to see Dr. Lindi Adie on 8/31 at 12pm. Left vm with the appt date and time. Letter mailed.

## 2017-06-03 NOTE — Telephone Encounter (Signed)
Dr. Bonner Puna states that patient has appointment tomorrow with you and was diagnosed with Metastatic lymphoma and was referred to cancer center and needs to have a mammogram before appointment with cancer center. Please call if you have any questions

## 2017-06-04 ENCOUNTER — Other Ambulatory Visit: Payer: Self-pay | Admitting: Family Medicine

## 2017-06-04 ENCOUNTER — Encounter: Payer: Self-pay | Admitting: Family Medicine

## 2017-06-04 ENCOUNTER — Ambulatory Visit (INDEPENDENT_AMBULATORY_CARE_PROVIDER_SITE_OTHER): Payer: Medicaid Other | Admitting: Family Medicine

## 2017-06-04 VITALS — BP 114/84 | HR 90 | Temp 97.9°F | Resp 14 | Ht 68.0 in | Wt 178.2 lb

## 2017-06-04 DIAGNOSIS — C799 Secondary malignant neoplasm of unspecified site: Secondary | ICD-10-CM | POA: Diagnosis not present

## 2017-06-04 DIAGNOSIS — Z1389 Encounter for screening for other disorder: Secondary | ICD-10-CM | POA: Diagnosis not present

## 2017-06-04 DIAGNOSIS — I5023 Acute on chronic systolic (congestive) heart failure: Secondary | ICD-10-CM | POA: Diagnosis not present

## 2017-06-04 DIAGNOSIS — Z1231 Encounter for screening mammogram for malignant neoplasm of breast: Secondary | ICD-10-CM

## 2017-06-04 DIAGNOSIS — Z1239 Encounter for other screening for malignant neoplasm of breast: Secondary | ICD-10-CM

## 2017-06-04 DIAGNOSIS — R9389 Abnormal findings on diagnostic imaging of other specified body structures: Secondary | ICD-10-CM

## 2017-06-04 DIAGNOSIS — C773 Secondary and unspecified malignant neoplasm of axilla and upper limb lymph nodes: Secondary | ICD-10-CM

## 2017-06-04 LAB — POCT URINALYSIS DIP (DEVICE)
BILIRUBIN URINE: NEGATIVE
Glucose, UA: NEGATIVE mg/dL
KETONES UR: NEGATIVE mg/dL
LEUKOCYTES UA: NEGATIVE
NITRITE: NEGATIVE
Protein, ur: NEGATIVE mg/dL
SPECIFIC GRAVITY, URINE: 1.01 (ref 1.005–1.030)
Urobilinogen, UA: 0.2 mg/dL (ref 0.0–1.0)
pH: 5.5 (ref 5.0–8.0)

## 2017-06-04 NOTE — Patient Instructions (Addendum)
-Breast mammogram is schedule for 06/06/2017 @ 8:30 [facility]. Phone: [phone]  - You will be able to obtain your medication from [facility] when you need medication refills.  -Return for follow-up 2 months, or sooner if needed.  -Keep all follow-up appointments.  DASH Eating Plan DASH stands for "Dietary Approaches to Stop Hypertension." The DASH eating plan is a healthy eating plan that has been shown to reduce high blood pressure (hypertension). It may also reduce your risk for type 2 diabetes, heart disease, and stroke. The DASH eating plan may also help with weight loss. What are tips for following this plan? General guidelines  Avoid eating more than 2,300 mg (milligrams) of salt (sodium) a day. If you have hypertension, you may need to reduce your sodium intake to 1,500 mg a day.  Limit alcohol intake to no more than 1 drink a day for nonpregnant women and 2 drinks a day for men. One drink equals 12 oz of beer, 5 oz of wine, or 1 oz of hard liquor.  Work with your health care provider to maintain a healthy body weight or to lose weight. Ask what an ideal weight is for you.  Get at least 30 minutes of exercise that causes your heart to beat faster (aerobic exercise) most days of the week. Activities may include walking, swimming, or biking.  Work with your health care provider or diet and nutrition specialist (dietitian) to adjust your eating plan to your individual calorie needs. Reading food labels  Check food labels for the amount of sodium per serving. Choose foods with less than 5 percent of the Daily Value of sodium. Generally, foods with less than 300 mg of sodium per serving fit into this eating plan.  To find whole grains, look for the word "whole" as the first word in the ingredient list. Shopping  Buy products labeled as "low-sodium" or "no salt added."  Buy fresh foods. Avoid canned foods and premade or frozen  meals. Cooking  Avoid adding salt when cooking. Use salt-free seasonings or herbs instead of table salt or sea salt. Check with your health care provider or pharmacist before using salt substitutes.  Do not fry foods. Cook foods using healthy methods such as baking, boiling, grilling, and broiling instead.  Cook with heart-healthy oils, such as olive, canola, soybean, or sunflower oil. Meal planning   Eat a balanced diet that includes: ? 5 or more servings of fruits and vegetables each day. At each meal, try to fill half of your plate with fruits and vegetables. ? Up to 6-8 servings of whole grains each day. ? Less than 6 oz of lean meat, poultry, or fish each day. A 3-oz serving of meat is about the same size as a deck of cards. One egg equals 1 oz. ? 2 servings of low-fat dairy each day. ? A serving of nuts, seeds, or beans 5 times each week. ? Heart-healthy fats. Healthy fats called Omega-3 fatty acids are found in foods such as flaxseeds and coldwater fish, like sardines, salmon, and mackerel.  Limit how much you eat of the following: ? Canned or prepackaged foods. ? Food that is high in trans fat, such as fried foods. ? Food that is high in saturated fat, such as fatty meat. ? Sweets, desserts, sugary drinks, and other foods with added sugar. ? Full-fat dairy products.  Do not salt foods before eating.  Try to eat at least 2 vegetarian meals each week.  Eat more home-cooked food and less restaurant, buffet, and fast food.  When eating at a restaurant, ask that your food be prepared with less salt or no salt, if possible. What foods are recommended? The items listed may not be a complete list. Talk with your dietitian about what dietary choices are best for you. Grains Whole-grain or whole-wheat bread. Whole-grain or whole-wheat pasta. Brown rice. [place]. Bulgur. Whole-grain and low-sodium cereals. Pita bread. Low-fat, low-sodium crackers. Whole-wheat flour  tortillas. Vegetables Fresh or frozen vegetables (raw, steamed, roasted, or grilled). Low-sodium or reduced-sodium tomato and vegetable juice. Low-sodium or reduced-sodium tomato sauce and tomato paste. Low-sodium or reduced-sodium canned vegetables. Fruits All fresh, dried, or frozen fruit. Canned fruit in natural juice (without added sugar). Meat and other protein foods Skinless chicken or [place]. Ground chicken or [place]. Pork with fat trimmed off. Fish and seafood. Egg whites. Dried beans, peas, or lentils. Unsalted nuts, nut butters, and seeds. Unsalted canned beans. Lean cuts of beef with fat trimmed off. Low-sodium, lean deli meat. Dairy Low-fat (1%) or fat-free (skim) milk. Fat-free, low-fat, or reduced-fat cheeses. Nonfat, low-sodium ricotta or cottage cheese. Low-fat or nonfat yogurt. Low-fat, low-sodium cheese. Fats and oils Soft margarine without trans fats. Vegetable oil. Low-fat, reduced-fat, or light mayonnaise and salad dressings (reduced-sodium). Canola, safflower, olive, soybean, and sunflower oils. Avocado. Seasoning and other foods Herbs. Spices. Seasoning mixes without salt. Unsalted popcorn and pretzels. Fat-free sweets. What foods are not recommended? The items listed may not be a complete list. Talk with your dietitian about what dietary choices are best for you. Grains Baked goods made with fat, such as croissants, muffins, or some breads. Dry pasta or rice meal packs. Vegetables Creamed or fried vegetables. Vegetables in a cheese sauce. Regular canned vegetables (not low-sodium or reduced-sodium). Regular canned tomato sauce and paste (not low-sodium or reduced-sodium). Regular tomato and vegetable juice (not low-sodium or reduced-sodium). Angie Fava. Olives. Fruits Canned fruit in a light or heavy syrup. Fried fruit. Fruit in cream or butter sauce. Meat and other protein foods Fatty cuts of meat. Ribs. Fried meat. Berniece Salines. Sausage. Bologna and other processed lunch meats.  Salami. Fatback. Hotdogs. Bratwurst. Salted nuts and seeds. Canned beans with added salt. Canned or smoked fish. Whole eggs or egg yolks. Chicken or [place] with skin. Dairy Whole or 2% milk, cream, and half-and-half. Whole or full-fat cream cheese. Whole-fat or sweetened yogurt. Full-fat cheese. Nondairy creamers. Whipped toppings. Processed cheese and cheese spreads. Fats and oils Butter. Stick margarine. Lard. Shortening. Ghee. Bacon fat. Tropical oils, such as coconut, palm kernel, or palm oil. Seasoning and other foods Salted popcorn and pretzels. Onion salt, garlic salt, seasoned salt, table salt, and sea salt. Worcestershire sauce. Tartar sauce. Barbecue sauce. Teriyaki sauce. Soy sauce, including reduced-sodium. Steak sauce. Canned and packaged gravies. Fish sauce. Oyster sauce. Cocktail sauce. Horseradish that you find on the shelf. Ketchup. Mustard. Meat flavorings and tenderizers. Bouillon cubes. Hot sauce and Tabasco sauce. Premade or packaged marinades. Premade or packaged taco seasonings. Relishes. Regular salad dressings. Where to find more information:  National Heart, Lung, and [place]: https://wilson-eaton.com/  American Heart Association: www.heart.org Summary  The DASH eating plan is a healthy eating plan that has been shown to reduce high blood pressure (hypertension). It may also reduce your risk for type 2 diabetes, heart disease, and stroke.  With the DASH eating plan, you should limit salt (sodium) intake to 2,300 mg a day. If you have hypertension, you may need to reduce your sodium intake  to 1,500 mg a day.  When on the DASH eating plan, aim to eat more fresh fruits and vegetables, whole grains, lean proteins, low-fat dairy, and heart-healthy fats.  Work with your health care provider or diet and nutrition specialist (dietitian) to adjust your eating plan to your individual calorie needs. This information is not intended to replace advice given to you by your health  care provider. Make sure you discuss any questions you have with your health care provider. Document Released: 09/13/2011 Document Revised: 09/17/2016 Document Reviewed: 09/17/2016 Elsevier Interactive Patient Education  2017 [employer].

## 2017-06-04 NOTE — Progress Notes (Signed)
Patient ID: Kylie Bennett, female    DOB: May 09, 1966, 51 y.o.   MRN: [id number]  PCP: Scot Jun, FNP  Chief Complaint  Patient presents with  . Establish Care  . Hospitalization Follow-up    Subjective:  HPI Kylie Bennett is a 51 y.o. female with a recent diagnoses of metastatic cancer of unknown primary site, presents today to establish care and hospital follow-up. Elizibeth presented to [facility] Emergency Department  05/21/2017 with a complaint of productive cough, dyspnea, fever, malaise, and hemoptysis. She has a history of tobacco use and recently stopped smoking. During recent hospitalization, CT chest was significant for bilateral pulmonary emboli in lower lung lobes, bibasilar pulmonary opacities, coronary artery calcifications, enlarged nodes left axilla, shotty nodes noted in the mediastinum, and small pleural and pericardial effusions. She was found to also have acute congestive heart failure (EF 15-20%) , which was a new diagnosis during this admission. Significant findings including a abnormal CA 125 cancer marker and lymph node biopsy was significant for metastatic carcinoma.  She will be followed by Dr. Nicholas Lose at [facility] who has requested that patient obtain a stat diagnostic ultrasound prior to her first office visit. Anabia reports decreased episodes of dyspnea since hospital discharge although she continues to experience pronounced hoarseness of voice. She denies chest pain, headache, and palpitations. Lashawn continues to experience mild dyspnea.  Social History   Social History  . Marital status: Single    Spouse name: N/A  . Number of children: N/A  . Years of education: N/A   Occupational History  . Not on file.   Social History Main Topics  . Smoking status: Former Smoker    Packs/day: 1.00    Types: Cigarettes  . Smokeless tobacco: Never Used  . Alcohol use Yes  . Drug use: No  . Sexual activity: Not on file   Other Topics Concern  .  Not on file   Social History Narrative  . No narrative on file    Family History  Problem Relation Age of Onset  . COPD Mother   . Rheum arthritis Mother   . Breast cancer Mother   . Hypertension Father   . Skin cancer Father   . Heart attack Father        age 37s  . Heart attack Paternal Grandfather   . Stroke Maternal Grandfather    Review of Systems See HPI  Patient Active Problem List   Diagnosis Date Noted  . Lymph node enlargement   . Acute systolic heart failure ([place])   . Bilateral pulmonary embolism ([place])   . Axillary adenopathy   . Hoarseness of voice   . Nonischemic cardiomyopathy ([place])   . Hyponatremia 05/22/2017  . Community acquired pneumonia of right lower lobe of lung ([place]) 05/22/2017  . Acute CHF (congestive heart failure) ([place]) 05/21/2017    Allergies  Allergen Reactions  . Unasyn [Ampicillin-Sulbactam Sodium] Other (See Comments)    Pt gets hot/flushed with infusion     Prior to Admission medications   Medication Sig Start Date End Date Taking? Authorizing Provider  apixaban (ELIQUIS) 5 MG TABS tablet take 10mg  (2 tabs) po BID for 7 days, then 5mg  (1 tab) po BID 06/01/17  Yes Patrecia Pour, MD  carvedilol (COREG) 3.125 MG tablet Take 1 tablet (3.125 mg total) by mouth 2 (two) times daily with a meal. 06/01/17  Yes Patrecia Pour, MD  digoxin (LANOXIN) 0.125 MG tablet Take 1 tablet (0.125  mg total) by mouth daily. 06/01/17  Yes Patrecia Pour, MD  fluticasone (FLONASE) 50 MCG/ACT nasal spray Place 2 sprays into both nostrils daily as needed for allergies.    Yes [provider]  furosemide (LASIX) 20 MG tablet Take 1 tablet (20 mg total) by mouth daily. 06/01/17  Yes Patrecia Pour, MD  magnesium oxide (MAG-OX) 400 (241.3 Mg) MG tablet Take 1 tablet (400 mg total) by mouth 2 (two) times daily. 06/01/17  Yes Patrecia Pour, MD  omeprazole (PRILOSEC) 20 MG capsule Take 20 mg by mouth daily.   Yes [provider]  sacubitril-valsartan (ENTRESTO)  49-51 MG Take 1 tablet by mouth 2 (two) times daily. 06/01/17  Yes Patrecia Pour, MD  spironolactone (ALDACTONE) 25 MG tablet Take 1 tablet (25 mg total) by mouth daily. 06/01/17  Yes Patrecia Pour, MD    Past Medical, Surgical Family and Social History reviewed and updated.    Objective:   Today's Vitals   06/04/17 1305  BP: 114/84  Pulse: 90  Resp: 14  Temp: 97.9 F (36.6 C)  TempSrc: Oral  SpO2: 97%  Weight: 178 lb 3.2 oz (80.8 kg)  Height: 5\' 8"  (1.727 m)    Wt Readings from Last 3 Encounters:  06/04/17 178 lb 3.2 oz (80.8 kg)  06/01/17 182 lb 1.6 oz (82.6 kg)  10/20/14 184 lb (83.5 kg)    Physical Exam  Constitutional: She is oriented to person, place, and time. She appears well-developed and well-nourished. No distress.  HENT:  Head: Normocephalic and atraumatic.  Nose: Nose normal.  Mouth/Throat: Oropharynx is clear and moist.  Eyes: Pupils are equal, round, and reactive to light. Conjunctivae and EOM are normal.  Neck: Normal range of motion. Neck supple.  Cardiovascular: Normal rate, regular rhythm, normal heart sounds and intact distal pulses.   Pulmonary/Chest: Effort normal and breath sounds normal.  Abdominal: Soft. Bowel sounds are normal.  Musculoskeletal: Normal range of motion.  Neurological: She is alert and oriented to person, place, and time.  Skin: Skin is warm and dry.  Psychiatric: She has a normal mood and affect. Her behavior is normal. Judgment and thought content normal.   Assessment & Plan:  1. Metastatic carcinoma (HCC) - Continue follow-up with oncology, Dr. Nicholas Lose, MD.   2. Acute on chronic systolic HF (heart failure) ([place]) -Continue current medications and follow-up with Cardiology   3. Screening for breast cancer -Stat US of bilateral breast including Axilla scheduled 06/06/2017 at 8:58 am   RTC: 6 weeks for routine wellness visit.   Carroll Sage. Kenton Kingfisher, MSN, FNP-C [facility]  [address] [place], [place] [address] [phone]

## 2017-06-06 ENCOUNTER — Ambulatory Visit
Admission: RE | Admit: 2017-06-06 | Discharge: 2017-06-06 | Disposition: A | Discharge status: Home / Self Care | Payer: Self-pay | Source: Ambulatory Visit | Attending: Family Medicine | Admitting: Family Medicine

## 2017-06-06 DIAGNOSIS — C799 Secondary malignant neoplasm of unspecified site: Secondary | ICD-10-CM

## 2017-06-06 DIAGNOSIS — C773 Secondary and unspecified malignant neoplasm of axilla and upper limb lymph nodes: Secondary | ICD-10-CM

## 2017-06-06 DIAGNOSIS — R9389 Abnormal findings on diagnostic imaging of other specified body structures: Secondary | ICD-10-CM

## 2017-06-07 ENCOUNTER — Ambulatory Visit (HOSPITAL_BASED_OUTPATIENT_CLINIC_OR_DEPARTMENT_OTHER): Payer: Medicaid Other | Admitting: Hematology and Oncology

## 2017-06-07 ENCOUNTER — Ambulatory Visit (HOSPITAL_BASED_OUTPATIENT_CLINIC_OR_DEPARTMENT_OTHER): Payer: Medicaid Other

## 2017-06-07 ENCOUNTER — Encounter: Payer: Self-pay | Admitting: Hematology and Oncology

## 2017-06-07 DIAGNOSIS — D371 Neoplasm of uncertain behavior of stomach: Secondary | ICD-10-CM

## 2017-06-07 DIAGNOSIS — I82403 Acute embolism and thrombosis of unspecified deep veins of lower extremity, bilateral: Secondary | ICD-10-CM | POA: Diagnosis not present

## 2017-06-07 DIAGNOSIS — C801 Malignant (primary) neoplasm, unspecified: Secondary | ICD-10-CM | POA: Diagnosis present

## 2017-06-07 DIAGNOSIS — C771 Secondary and unspecified malignant neoplasm of intrathoracic lymph nodes: Secondary | ICD-10-CM | POA: Diagnosis not present

## 2017-06-07 DIAGNOSIS — Z720 Tobacco use: Secondary | ICD-10-CM

## 2017-06-07 DIAGNOSIS — D4989 Neoplasm of unspecified behavior of other specified sites: Secondary | ICD-10-CM

## 2017-06-07 LAB — CEA (IN HOUSE-CHCC): CEA (CHCC-In House): 7.28 ng/mL — ABNORMAL HIGH (ref 0.00–5.00)

## 2017-06-07 NOTE — Progress Notes (Signed)
Called and lvm for pt to notify her of scheduled PET Scan on 06/13/17 at [facility]. Call back number provided for questions. Instructed pt that she has to be NPO for 8hrs prior. Pre auth sent.

## 2017-06-07 NOTE — Progress Notes (Signed)
[facility] CONSULT NOTE  Patient Care Team: Scot Jun, FNP as PCP - General (Family Medicine)  CHIEF COMPLAINTS/PURPOSE OF CONSULTATION:  Carcinoma unknown primary  HISTORY OF PRESENTING ILLNESS:  Kylie Bennett 51 y.o. female is here because of recent diagnosis of left thoracic inlet lymphadenopathy biopsy-proven to be metastatic carcinoma of unknown primary. Patient recently quit smoking and elevated sore throat and a cough and went to the urgent care and was diagnosed with bronchitis and was treated with antibiotics. In spite of this symptoms did not improve. She started developing profound leg swelling and she went back to the emergency room and she was transferred to call hospital. She had massive bilateral lower extremity DVT and bilateral neck vein DVTs including extension into the cerebral venous sinuses. She was treated with aggressive IV Lasix and anticoagulation with Ellik was. With these measures she lost about 60 pounds of water weight and her skin healed up markedly. On CT scan there was a conglomeration of lymph nodes in the left side thoracic inlet and she underwent a CT-guided biopsy which came back as metastatic carcinoma. There was no other evidence of primary disease. The pathology report also stated that this could be a lobular breast cancer and hence she was sent to me for evaluation for breast cancer. Yesterday she had a mammogram and ultrasound which were negative for any cancers. She is here today accompanied by her fianc and her sister. She has a daughter. She has a previous history of cardiomyopathy with LV dysfunction and sees Dr. Haroldine Laws. She denied any blood in the stool. Denies any abdominal pain. Denies any cough expectoration or fevers or chills.  I reviewed her records extensively and collaborated the history with the patient.  SUMMARY OF ONCOLOGIC HISTORY:   Carcinoma of unknown primary ([place])   05/28/2017 Imaging    Extensive thrombosis in  the right head and neck, sigmoid dural sinus thrombosis, right IJ thrombus, right facial vein thrombosis, left subclavian vein thrombosis; soft tissue left thoracic inlet 2.7 cm conglomeration of lymph nodes      05/30/2017 Initial Diagnosis    Left supraclavicular lymph node biopsy: Metastatic carcinoma positive for pancytokeratin and cytokeratin 7. negative for cytokeratin 20, GATA-3, GCDFP, ER, PR, and TTF-1. CD68 and CD45 highlight surrounding lymphocytes and histiocytes      MEDICAL HISTORY:  Past Medical History:  Diagnosis Date  . GERD (gastroesophageal reflux disease)   . Seasonal allergies     SURGICAL HISTORY: Past Surgical History:  Procedure Laterality Date  . IR US GUIDE BX ASP/DRAIN  05/30/2017  . RIGHT/LEFT HEART CATH AND CORONARY ANGIOGRAPHY N/A 05/27/2017   Procedure: RIGHT/LEFT HEART CATH AND CORONARY ANGIOGRAPHY;  Surgeon: Jolaine Artist, MD;  Location: [facility];  Service: Cardiovascular;  Laterality: N/A;    SOCIAL HISTORY: Social History   Social History  . Marital status: Single    Spouse name: N/A  . Number of children: N/A  . Years of education: N/A   Occupational History  . Not on file.   Social History Main Topics  . Smoking status: Former Smoker    Packs/day: 1.00    Types: Cigarettes  . Smokeless tobacco: Never Used  . Alcohol use Yes  . Drug use: No  . Sexual activity: Not on file   Other Topics Concern  . Not on file   Social History Narrative  . No narrative on file    FAMILY HISTORY: Family History  Problem Relation Age of Onset  .  COPD Mother   . Rheum arthritis Mother   . Breast cancer Mother   . Hypertension Father   . Skin cancer Father   . Heart attack Father        age 30s  . Heart attack Paternal Grandfather   . Stroke Maternal Grandfather     ALLERGIES:  is allergic to unasyn [ampicillin-sulbactam sodium].  MEDICATIONS:  Current Outpatient Prescriptions  Medication Sig Dispense Refill  .  apixaban (ELIQUIS) 5 MG TABS tablet take 10mg  (2 tabs) po BID for 7 days, then 5mg  (1 tab) po BID 74 tablet 0  . carvedilol (COREG) 3.125 MG tablet Take 1 tablet (3.125 mg total) by mouth 2 (two) times daily with a meal. 60 tablet 0  . digoxin (LANOXIN) 0.125 MG tablet Take 1 tablet (0.125 mg total) by mouth daily. 30 tablet 0  . fluticasone (FLONASE) 50 MCG/ACT nasal spray Place 2 sprays into both nostrils daily as needed for allergies.     . furosemide (LASIX) 20 MG tablet Take 1 tablet (20 mg total) by mouth daily. 30 tablet 0  . magnesium oxide (MAG-OX) 400 (241.3 Mg) MG tablet Take 1 tablet (400 mg total) by mouth 2 (two) times daily. 60 tablet 0  . omeprazole (PRILOSEC) 20 MG capsule Take 20 mg by mouth daily.    . sacubitril-valsartan (ENTRESTO) 49-51 MG Take 1 tablet by mouth 2 (two) times daily. 60 tablet 0  . spironolactone (ALDACTONE) 25 MG tablet Take 1 tablet (25 mg total) by mouth daily. 30 tablet 0   No current facility-administered medications for this visit.     REVIEW OF SYSTEMS:   Constitutional: Denies fevers, chills or abnormal night sweats Eyes: Denies blurriness of vision, double vision or watery eyes Ears, nose, mouth, throat, and face: Denies mucositis or sore throat Respiratory: Denies cough, dyspnea or wheezes Cardiovascular: Denies palpitation, chest discomfort or lower extremity swelling Gastrointestinal:  Denies nausea, heartburn or change in bowel habits Skin: Bilateral lower extremity skin changes related to prior swelling of the feet Lymphatics: Denies new lymphadenopathy or easy bruising Neurological:Denies numbness, tingling or new weaknesses Behavioral/Psych: Mood is stable, no new changes  Breast:  Denies any palpable lumps or discharge All other systems were reviewed with the patient and are negative.  PHYSICAL EXAMINATION: ECOG PERFORMANCE STATUS: 1 - Symptomatic but completely ambulatory  Vitals:   06/07/17 1202  BP: 121/71  Pulse: (!) 105   Resp: 18  Temp: 98.3 F (36.8 C)  SpO2: 99%   Filed Weights   06/07/17 1202  Weight: 176 lb 11.2 oz (80.2 kg)    GENERAL:alert, no distress and comfortable SKIN: skin color, texture, turgor are normal, no rashes or significant lesions EYES: normal, conjunctiva are pink and non-injected, sclera clear OROPHARYNX:no exudate, no erythema and lips, buccal mucosa, and tongue normal  NECK: supple, thyroid normal size, non-tender, without nodularity LYMPH:  no palpable lymphadenopathy in the cervical, axillary or inguinal LUNGS: clear to auscultation and percussion with normal breathing effort HEART: regular rate & rhythm and no murmurs and no lower extremity edema ABDOMEN:abdomen soft, non-tender and normal bowel sounds Musculoskeletal:no cyanosis of digits and no clubbing  PSYCH: alert & oriented x 3 with fluent speech NEURO: no focal motor/sensory deficits   LABORATORY DATA:  I have reviewed the data as listed Lab Results  Component Value Date   WBC 8.9 06/01/2017   HGB 11.9 (L) 06/01/2017   HCT 40.3 06/01/2017   MCV 88.6 06/01/2017   PLT 636 (H) 06/01/2017  Lab Results  Component Value Date   NA 136 06/01/2017   K 4.7 06/01/2017   CL 101 06/01/2017   CO2 27 06/01/2017    RADIOGRAPHIC STUDIES: I have personally reviewed the radiological reports and agreed with the findings in the report.  ASSESSMENT AND PLAN:  Carcinoma of unknown primary ([place]) Carcinoma of unknown primary: Patient presented with right supraclavicular lymph node conglomeration measuring 2.7 cm in size biopsy-proven metastatic carcinoma. Immunohistochemical stains did not identify a clear-cut etiology. positive for pancytokeratin and cytokeratin 7. negative for cytokeratin 20, GATA-3, GCDFP, ER, PR, and TTF-1. CD68 and CD45 highlight surrounding lymphocytes and histiocytes. Mammogram and ultrasound negative for breast cancer  Differential diagnosis: Gastrointestinal tumors. Recommendation: PET/CT scan  and tumor markers If no clear-cut evidence of disease in many on the scans, that she will need upper endoscopy with or without colonoscopy. If that's normal then we may need to send tissue for cancer type IV as well as foundation 1 analysis.  Severe and extensive DVTs: Continue with anticoagulation with Apixaban.  I will call her with the results of the scans and lab work. Patient fully understands that if she was diagnosed with a gastrointestinal primary then I will be referring her to my partner.  All questions were answered. The patient knows to call the clinic with any problems, questions or concerns.    Rulon Eisenmenger, MD 06/07/17

## 2017-06-07 NOTE — Assessment & Plan Note (Signed)
Carcinoma of unknown primary: Patient presented with right supraclavicular lymph node conglomeration measuring 2.7 cm in size biopsy-proven metastatic carcinoma. Immunohistochemical stains did not identify a clear-cut etiology. positive for pancytokeratin and cytokeratin 7. negative for cytokeratin 20, GATA-3, GCDFP, ER, PR, and TTF-1. CD68 and CD45 highlight surrounding lymphocytes and histiocytes. Mammogram and ultrasound negative for breast cancer  Differential diagnosis: Gastrointestinal tumors. Recommendation: PET/CT scan  Severe and extensive DVTs: Continue with anticoagulation with Apixaban.  Return to clinic after PET/CT scan to discuss the results. Patient may need upper endoscopy for further evaluation.

## 2017-06-08 LAB — CANCER ANTIGEN 27.29: CAN 27.29: 49 U/mL — AB (ref 0.0–38.6)

## 2017-06-08 LAB — AFP TUMOR MARKER: AFP, Serum, Tumor Marker: 3.7 ng/mL (ref 0.0–8.3)

## 2017-06-08 LAB — CA 125: CANCER ANTIGEN (CA) 125: 251.1 U/mL — AB (ref 0.0–38.1)

## 2017-06-08 LAB — CANCER ANTIGEN 19-9: CA 19-9: 19 U/mL (ref 0–35)

## 2017-06-12 ENCOUNTER — Encounter (HOSPITAL_COMMUNITY): Payer: Self-pay

## 2017-06-12 ENCOUNTER — Ambulatory Visit (HOSPITAL_COMMUNITY)
Admit: 2017-06-12 | Discharge: 2017-06-12 | Disposition: A | Discharge status: Home / Self Care | Payer: Medicaid Other | Attending: Internal Medicine | Admitting: Internal Medicine

## 2017-06-12 ENCOUNTER — Encounter (HOSPITAL_COMMUNITY): Payer: Self-pay | Admitting: Pharmacist

## 2017-06-12 ENCOUNTER — Telehealth: Payer: Self-pay

## 2017-06-12 VITALS — BP 106/92 | HR 104 | Ht 67.5 in | Wt 176.8 lb

## 2017-06-12 DIAGNOSIS — C801 Malignant (primary) neoplasm, unspecified: Secondary | ICD-10-CM

## 2017-06-12 DIAGNOSIS — Z808 Family history of malignant neoplasm of other organs or systems: Secondary | ICD-10-CM | POA: Diagnosis not present

## 2017-06-12 DIAGNOSIS — Z8249 Family history of ischemic heart disease and other diseases of the circulatory system: Secondary | ICD-10-CM | POA: Diagnosis not present

## 2017-06-12 DIAGNOSIS — I5022 Chronic systolic (congestive) heart failure: Secondary | ICD-10-CM | POA: Diagnosis not present

## 2017-06-12 DIAGNOSIS — Z86711 Personal history of pulmonary embolism: Secondary | ICD-10-CM | POA: Insufficient documentation

## 2017-06-12 DIAGNOSIS — I2699 Other pulmonary embolism without acute cor pulmonale: Secondary | ICD-10-CM

## 2017-06-12 DIAGNOSIS — Z880 Allergy status to penicillin: Secondary | ICD-10-CM | POA: Diagnosis not present

## 2017-06-12 DIAGNOSIS — I428 Other cardiomyopathies: Secondary | ICD-10-CM

## 2017-06-12 DIAGNOSIS — R042 Hemoptysis: Secondary | ICD-10-CM | POA: Insufficient documentation

## 2017-06-12 DIAGNOSIS — Z8261 Family history of arthritis: Secondary | ICD-10-CM | POA: Insufficient documentation

## 2017-06-12 DIAGNOSIS — R221 Localized swelling, mass and lump, neck: Secondary | ICD-10-CM | POA: Insufficient documentation

## 2017-06-12 DIAGNOSIS — R002 Palpitations: Secondary | ICD-10-CM | POA: Diagnosis not present

## 2017-06-12 DIAGNOSIS — K219 Gastro-esophageal reflux disease without esophagitis: Secondary | ICD-10-CM | POA: Insufficient documentation

## 2017-06-12 DIAGNOSIS — I429 Cardiomyopathy, unspecified: Secondary | ICD-10-CM | POA: Insufficient documentation

## 2017-06-12 DIAGNOSIS — Z79899 Other long term (current) drug therapy: Secondary | ICD-10-CM | POA: Insufficient documentation

## 2017-06-12 DIAGNOSIS — Z87891 Personal history of nicotine dependence: Secondary | ICD-10-CM | POA: Insufficient documentation

## 2017-06-12 DIAGNOSIS — I2510 Atherosclerotic heart disease of native coronary artery without angina pectoris: Secondary | ICD-10-CM | POA: Insufficient documentation

## 2017-06-12 DIAGNOSIS — Z823 Family history of stroke: Secondary | ICD-10-CM | POA: Diagnosis not present

## 2017-06-12 DIAGNOSIS — Z825 Family history of asthma and other chronic lower respiratory diseases: Secondary | ICD-10-CM | POA: Insufficient documentation

## 2017-06-12 LAB — DIGOXIN LEVEL: DIGOXIN LVL: 0.3 ng/mL — AB (ref 0.8–2.0)

## 2017-06-12 LAB — BASIC METABOLIC PANEL
ANION GAP: 9 (ref 5–15)
BUN: 16 mg/dL (ref 6–20)
CALCIUM: 9.3 mg/dL (ref 8.9–10.3)
CO2: 23 mmol/L (ref 22–32)
Chloride: 104 mmol/L (ref 101–111)
Creatinine, Ser: 0.81 mg/dL (ref 0.44–1.00)
GFR calc Af Amer: >60 mL/min (ref >60)
GLUCOSE: 199 mg/dL — AB (ref 65–99)
POTASSIUM: 4.5 mmol/L (ref 3.5–5.1)
Sodium: 136 mmol/L (ref 135–145)

## 2017-06-12 MED ORDER — APIXABAN 5 MG PO TABS: 5.0000 mg | ORAL_TABLET | Freq: Two times a day (BID) | ORAL | 3 refills | Status: DC

## 2017-06-12 MED ORDER — SACUBITRIL-VALSARTAN 49-51 MG PO TABS: 1.0000 | ORAL_TABLET | Freq: Two times a day (BID) | ORAL | 11 refills | Status: DC

## 2017-06-12 NOTE — Progress Notes (Signed)
CSW met with patient in the clinic to discuss insurance. Patient states she has pending medicaid that was started in the hospital by financial counseling. Patient also states a pending SSI application as well. Patient has an assigned Insurance account manager and has been in contact regarding status. Patient denies any other needs at this time and will reach out to CSW if needed. Raquel Sarna, [place], [place]

## 2017-06-12 NOTE — Telephone Encounter (Signed)
Vinnie Level at [facility] called for urine pregnancy test to be done before her procedure tomorrow. Order placed.

## 2017-06-12 NOTE — Progress Notes (Signed)
Advanced Heart Failure Clinic Note   Primary Cardiologist: Dr. Meda Coffee Primary HF: Dr. Haroldine Laws   HPI:  Kylie Bennett is a 51 y.o. 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   Admitted 05/21/17 to 06/01/17 with SOB. Found to have bilateral PEs and right subclavian DVTs involving the head and neck.  as CT head/neck showed a mass of lymph nodes. US biopsy confirmed metastatic carcinoma.  Echo with depressed EF as below. She was diuresed with new HF this admission.  She diuresed over 20 L and down 40 lbs from admission. Discharge weight 182 lbs.   She presents today for post hospital follow up. She has been feeling great overall. She did have mild SOB with unloading groceries, but otherwise no DOE with daily activities. She denies lightheadedness or dizziness. Occasional palpitations, but she states this only occurred when she was SOB with groceries. Denies CP. No swelling in legs. Bilateral ankle wounds have continued to heal and are drying up. Watching fluid and salt intake. Taking all medications as directed. Denies melena or BRBPR on Eliquis. Does continue to have scant hemoptysis, especially first thing in the morning.   Echo 05/22/2017  EF 15-20% with severe RV dysfunction. NICM  [facility] 05/27/17  Mid RCA lesion, 20 %stenosed.  Dist LAD lesion, 40 %stenosed. RHC hemodynamics AO = 123/89 (104) LV= 124/13 RA = 7 RV = 42/7 PA = 45/20 (29) PCW = 11 Fick cardiac output/index = 5.4/2.7 PVR = 3.3 WU SVR 1448 FA sat = 95% PA sat = 65%, 68%  CT chest 05/27/17 (Personally reviewed)  IMPRESSION: 1. Bilateral pulmonary emboli in the lower lobes and perhaps the right upper lobe. No heart strain identified. 2. Bibasilar pulmonary opacities. These opacities could represent pneumonia. Aspiration is possible. Pulmonary infarcts cannot be excluded given the pulmonary emboli. Recommend follow-up to resolution. 3.  Coronary artery calcifications. 4. Mildly enlarged nodes and increased attenuation in the fat of the left axilla. This is a nonspecific finding. Recommend clinical correlation and short-term follow-up to resolution. 5. Shotty nodes in the mediastinum may be reactive given the findings in the lungs. 6. Small pleural effusions. Small pericardial effusion.  Review of systems complete and found to be negative unless listed in HPI.    Past Medical History:  Diagnosis Date  . GERD (gastroesophageal reflux disease)   . Seasonal allergies     Current Outpatient Prescriptions  Medication Sig Dispense Refill  . acetaminophen (TYLENOL) 325 MG tablet Take 650 mg by mouth every 6 (six) hours as needed for mild pain.    Marland Kitchen apixaban (ELIQUIS) 5 MG TABS tablet Take 5 mg by mouth 2 (two) times daily.    . carvedilol (COREG) 3.125 MG tablet Take 1 tablet (3.125 mg total) by mouth 2 (two) times daily with a meal. 60 tablet 0  . digoxin (LANOXIN) 0.125 MG tablet Take 1 tablet (0.125 mg total) by mouth daily. 30 tablet 0  . fluticasone (FLONASE) 50 MCG/ACT nasal spray Place 2 sprays into both nostrils daily as needed for allergies.     . furosemide (LASIX) 20 MG tablet Take 1 tablet (20 mg total) by mouth daily. 30 tablet 0  . magnesium oxide (MAG-OX) 400 (241.3 Mg) MG tablet Take 1 tablet (400 mg total) by mouth 2 (two) times daily. 60 tablet 0  . omeprazole (PRILOSEC) 20 MG capsule Take 20 mg by mouth daily.    . sacubitril-valsartan (ENTRESTO) 49-51  MG Take 1 tablet by mouth 2 (two) times daily. 60 tablet 0  . spironolactone (ALDACTONE) 25 MG tablet Take 1 tablet (25 mg total) by mouth daily. 30 tablet 0   No current facility-administered medications for this encounter.     Allergies  Allergen Reactions  . Unasyn [Ampicillin-Sulbactam Sodium] Other (See Comments)    Pt gets hot/flushed with infusion       Social History   Social History  . Marital status: Single    Spouse name: N/A  .  Number of children: N/A  . Years of education: N/A   Occupational History  . Not on file.   Social History Main Topics  . Smoking status: Former Smoker    Packs/day: 1.00    Types: Cigarettes  . Smokeless tobacco: Never Used  . Alcohol use Yes  . Drug use: No  . Sexual activity: Not on file   Other Topics Concern  . Not on file   Social History Narrative  . No narrative on file      Family History  Problem Relation Age of Onset  . COPD Mother   . Rheum arthritis Mother   . Breast cancer Mother   . Hypertension Father   . Skin cancer Father   . Heart attack Father        age 20s  . Heart attack Paternal Grandfather   . Stroke Maternal Grandfather     Vitals:   06/12/17 1011  BP: (!) 106/92  Pulse: (!) 104  SpO2: 98%  Weight: 176 lb 12.8 oz (80.2 kg)  Height: 5' 7.5" (1.715 m)   Wt Readings from Last 3 Encounters:  06/12/17 176 lb 12.8 oz (80.2 kg)  06/07/17 176 lb 11.2 oz (80.2 kg)  06/04/17 178 lb 3.2 oz (80.8 kg)    PHYSICAL EXAM: General:  Well appearing. No respiratory difficulty HEENT: normal Neck: supple. no JVD. Carotids 2+ bilat; no bruits. No lymphadenopathy or thyromegaly appreciated. Cor: PMI nondisplaced. Regular rate & rhythm. No rubs, gallops or murmurs. Lungs: clear Abdomen: soft, nontender, nondistended. No hepatosplenomegaly. No bruits or masses. Good bowel sounds. Extremities: no cyanosis, clubbing, or, edema. Bilateral feet with wounds in various stages of healing. No drainage. Neuro: alert & oriented x 3, cranial nerves grossly intact. moves all 4 extremities w/o difficulty. Affect pleasant.  ECG: NSR 99 bpm. Personally reviewed  ASSESSMENT & PLAN:  1. Chronic systolic HF with biventricular dysfunction - Echo 05/22/2017 EF 15-20% by echo with severe RV dysfunction. NICM - Volume status stable on lasix 20 mg daily.  - Continue entresto 49-51 mg BID. Pressures too soft for up-titration.  - Continue Digoxin 0.125 mg daily. Level today.   - Continue Spiro 25 mg daily.  - No b-blocker yet  - Unable to wear ted hose due to wounds on feet.  - R/LHC 05/27/17 with mild, non-obstructive CAD and well-compensated hemodynamics.  2. RIJ/sublcavianDVT with bilateral PE  - VQ 05/23/17 with bilateral perfusion defects. - CT chest 05/27/17 with bilateral PE but no clot in main PAs. No RV strain - Continue Eliquis 5 mg BID ( has finished PE dosing). Denies bleeding.  - Continues to have scant hemoptysis, especially in the mornings.    3. Tobacco use -  Continue abstinence. Congratulated.   4. Biopsy confirmed metastatic carcinoma.  - IR saw during recent admission. S/P lymph node biopsy 05/30/17. Pathology positive for metastatic carcinoma.  - No evidence of Breast CA on Mammogram or [place] 06/06/17. - Plan for  PET scan tomorrow per Dr. Lindi Adie.  - CEA mildly positive and CA-125 positive. Leaning towards abdominal source, possibly ovarian. She did NOT have lower abdominal imaging during recent admission.  5. Bilateral feet wounds - Well healing. Continue home care.   Stable on current meds. Would not push up-titration with soft pressures and recent decompensation. No BB for now.  RTC 4 weeks.   Shirley Friar, PA-C 06/12/17   Patient seen and examined with the above-signed Advanced Practice Provider and/or Housestaff. I personally reviewed laboratory data, imaging studies and relevant notes. I independently examined the patient and formulated the important aspects of the plan. I have edited the note to reflect any of my changes or salient points. I have personally discussed the plan with the patient and/or family.  Symptomatically much improved. NYHA II-III. Volume status looks good. HR still fast but improving.  Continue current HF regimen. Can add ivabradine as needed. LE wounds healing. Tumor markers and surgical path reviewed with her. CA 125 markedly elevated. Concern for ovarian CA discussed. Will f/u with Dr. Lindi Adie.    Glori Bickers, MD  11:11 AM

## 2017-06-12 NOTE — Progress Notes (Signed)
Advanced Heart Failure Medication Review by a Pharmacist  Does the patient  feel that his/her medications are working for him/her?  yes  Has the patient been experiencing any side effects to the medications prescribed?  no  Does the patient measure his/her own blood pressure or blood glucose at home?  no   Does the patient have any problems obtaining medications due to transportation or finances?   Yes - no rx insurance currently - will have her fill out application for Entresto and Eliquis PAF  Understanding of regimen: good Understanding of indications: good Potential of compliance: good Patient understands to avoid NSAIDs. Patient understands to avoid decongestants.  Issues to address at subsequent visits: None   Pharmacist comments: Mrs. Kylie Bennett is a pleasant 51 yo F presenting with her husband and an updated medication list. She reports good compliance with her regimen and did not have any specific medication-related questions or concerns for me at this time.   Ruta Hinds. Velva Harman, PharmD, BCPS, CPP Clinical Pharmacist Pager: [phone] Phone: [phone] 06/12/2017 10:24 AM      Time with patient: 10 minutes Preparation and documentation time: 10 minutes Total time: 20 minutes

## 2017-06-12 NOTE — Patient Instructions (Addendum)
Routine lab work today. Will notify you of abnormal results, otherwise no news is good news!  Ileene Patrick CHF clinical pharmacist has enrolled you in the [place] for [employer] assistance as well as the [place] for [employer]. Contact her directly for any questions/concerns/needs.           Follow up 4 weeks with Oda Kilts PA-C. Take all medication as prescribed the day of your appointment. Bring all medications with you to your appointment.  ____________________________________________________________________  ____________________________________________________________________  Do the following things EVERYDAY: 1) Weigh yourself in the morning before breakfast. Write it down and keep it in a log. 2) Take your medicines as prescribed 3) Eat low salt foods-Limit salt (sodium) to 2000 mg per day.  4) Stay as active as you can everyday 5) Limit all fluids for the day to less than 2 liters

## 2017-06-13 ENCOUNTER — Other Ambulatory Visit
Admission: RE | Admit: 2017-06-13 | Discharge: 2017-06-13 | Disposition: A | Discharge status: Home / Self Care | Payer: Medicaid Other | Source: Ambulatory Visit | Attending: Hematology and Oncology | Admitting: Hematology and Oncology

## 2017-06-13 ENCOUNTER — Ambulatory Visit
Admission: RE | Admit: 2017-06-13 | Discharge: 2017-06-13 | Disposition: A | Discharge status: Home / Self Care | Payer: Medicaid Other | Source: Ambulatory Visit | Attending: Hematology and Oncology | Admitting: Hematology and Oncology

## 2017-06-13 DIAGNOSIS — C801 Malignant (primary) neoplasm, unspecified: Secondary | ICD-10-CM

## 2017-06-13 DIAGNOSIS — D4989 Neoplasm of unspecified behavior of other specified sites: Secondary | ICD-10-CM | POA: Diagnosis not present

## 2017-06-13 LAB — GLUCOSE, CAPILLARY: GLUCOSE-CAPILLARY: 155 mg/dL — AB (ref 65–99)

## 2017-06-13 LAB — PREGNANCY, URINE: PREG TEST UR: NEGATIVE

## 2017-06-13 MED ORDER — FLUDEOXYGLUCOSE F - 18 (FDG) INJECTION
12.2500 | Freq: Once | INTRAVENOUS | Status: AC | PRN
  Administered 2017-06-13: 12.25 via INTRAVENOUS

## 2017-06-13 MED FILL — !ELIQUIS 5MG TABLET: 5 | 30 days supply | Qty: 60 | Fill #0 | Status: TO

## 2017-06-18 ENCOUNTER — Other Ambulatory Visit (HOSPITAL_COMMUNITY): Payer: Self-pay | Admitting: Family Medicine

## 2017-06-18 ENCOUNTER — Encounter: Payer: Self-pay | Admitting: Family Medicine

## 2017-06-18 ENCOUNTER — Telehealth (HOSPITAL_COMMUNITY): Payer: Self-pay | Admitting: Pharmacist

## 2017-06-18 NOTE — Telephone Encounter (Signed)
[employer] patient assistance approved for Eliquis 5 mg BID through 06/17/18.   Ruta Hinds. Velva Harman, PharmD, BCPS, CPP Clinical Pharmacist Pager: [phone] Phone: [phone] 06/18/2017 2:33 PM

## 2017-06-18 NOTE — Telephone Encounter (Addendum)
[employer] patient assistance approved for Entresto 49-51 mg BID through 06/14/18.  Ruta Hinds. Velva Harman, PharmD, BCPS, CPP Clinical Pharmacist Pager: [phone] Phone: [phone] 06/18/2017 2:05 PM

## 2017-06-19 ENCOUNTER — Ambulatory Visit (HOSPITAL_COMMUNITY): Payer: Self-pay

## 2017-06-20 ENCOUNTER — Telehealth: Payer: Self-pay | Admitting: Family Medicine

## 2017-06-20 ENCOUNTER — Encounter: Payer: Self-pay | Admitting: Family Medicine

## 2017-06-20 MED ORDER — SPIRONOLACTONE 25 MG PO TABS: 25.0000 mg | ORAL_TABLET | Freq: Every day | ORAL | 1 refills | Status: DC

## 2017-06-20 MED ORDER — CARVEDILOL 3.125 MG PO TABS: 3.1250 mg | ORAL_TABLET | Freq: Two times a day (BID) | ORAL | 2 refills | Status: DC

## 2017-06-20 MED ORDER — FUROSEMIDE 20 MG PO TABS: 20.0000 mg | ORAL_TABLET | Freq: Every day | ORAL | 1 refills | Status: DC

## 2017-06-20 MED ORDER — DIGOXIN 125 MCG PO TABS: 0.1250 mg | ORAL_TABLET | Freq: Every day | ORAL | 1 refills | Status: DC

## 2017-06-20 MED FILL — SPIRONOLACTONE 25 MG TABLET: 25 | 30 days supply | Qty: 30 | Fill #0

## 2017-06-20 MED FILL — ?CARVEDILOL 3.125 MG TABLET: 3.125 | 30 days supply | Qty: 60 | Fill #0

## 2017-06-20 MED FILL — DIGITEK 125 MCG TABLET: 125 | 30 days supply | Qty: 30 | Fill #0

## 2017-06-20 MED FILL — ?FUROSEMIDE 20 MG TABLET: 20 | 30 days supply | Qty: 30 | Fill #0

## 2017-06-20 NOTE — Telephone Encounter (Signed)
Refilled medications per My Chart request.

## 2017-06-21 ENCOUNTER — Telehealth: Payer: Self-pay

## 2017-06-21 ENCOUNTER — Other Ambulatory Visit: Payer: Self-pay

## 2017-06-21 DIAGNOSIS — C801 Malignant (primary) neoplasm, unspecified: Secondary | ICD-10-CM

## 2017-06-21 NOTE — Telephone Encounter (Signed)
Called pt to notify her of [facility] physician GI referral appt on Monday 06/24/17 at 1030am. Pt advised to bring in insurance card with her. Pt states that she is unable to make the appt on Monday due to transportation issues. Provided pt with office number for  [facility] [phone] (Dr.William Outlaw). Told pt to call and reschedule if needed. Per Dr.Gudena, endoscopy highly suggest to be done next week to evaluate for possible gastrointestinal tumor and to send out for foundation1/cancertyle ID.

## 2017-06-24 DIAGNOSIS — C801 Malignant (primary) neoplasm, unspecified: Secondary | ICD-10-CM | POA: Diagnosis not present

## 2017-06-24 DIAGNOSIS — Z7901 Long term (current) use of anticoagulants: Secondary | ICD-10-CM | POA: Diagnosis not present

## 2017-06-25 ENCOUNTER — Telehealth: Payer: Self-pay

## 2017-06-25 NOTE — Progress Notes (Signed)
Spoke with Suanne Marker from pathology to request foundation 1 and cancer type ID. Request will be sent out from the [facility] pathology.

## 2017-06-25 NOTE — Telephone Encounter (Signed)
error 

## 2017-07-04 MED FILL — NULYTELY WITH FLAVOR PACKS: 420 | 30 days supply | Qty: 4000 | Fill #0

## 2017-07-05 ENCOUNTER — Other Ambulatory Visit: Payer: Self-pay | Admitting: Gastroenterology

## 2017-07-08 ENCOUNTER — Other Ambulatory Visit: Payer: Self-pay

## 2017-07-08 MED ORDER — ENOXAPARIN SODIUM 80 MG/0.8ML ~~LOC~~ SOLN: SUBCUTANEOUS | 0 refills | Status: DC

## 2017-07-08 MED ORDER — ENOXAPARIN SODIUM 80 MG/0.8ML ~~LOC~~ SOLN: 80.0000 mg | Freq: Two times a day (BID) | SUBCUTANEOUS | 0 refills | Status: DC

## 2017-07-08 NOTE — Telephone Encounter (Signed)
Called pt to notify her to stop taking eliquis and to start taking lovenox 48hrs prior to EGD/Colonoscopy. Sent script for lovenox to pt preferred pharmacy today, per Dr.Gudena's orders.No lovenox the day of procedure and may restart Eliquis the day after procedure.   Called Dr.Outlaw's office and lvm with Margreta Journey [phone]) to see when pt appt for egd and colonoscopy is scheduled. Informed pt that this RN or Dr.Outlaw's office will be calling her to confirm lovenox bridge for her EGD/colonoscopy procedure. Pt verbalized understanding and will wait for further instructions.

## 2017-07-09 ENCOUNTER — Other Ambulatory Visit: Payer: Self-pay | Admitting: Gastroenterology

## 2017-07-09 NOTE — Progress Notes (Signed)
Spoke with patient by phone, patient stated procedure to be rescheduled, due to lovenox not called into pharmacy.

## 2017-07-10 ENCOUNTER — Ambulatory Visit (HOSPITAL_COMMUNITY): Admission: RE | Admit: 2017-07-10 | Payer: Medicaid Other | Source: Ambulatory Visit | Admitting: Gastroenterology

## 2017-07-10 ENCOUNTER — Encounter (HOSPITAL_COMMUNITY): Payer: Self-pay

## 2017-07-10 SURGERY — ESOPHAGOGASTRODUODENOSCOPY (EGD) WITH PROPOFOL
Anesthesia: Monitor Anesthesia Care

## 2017-07-11 ENCOUNTER — Telehealth (HOSPITAL_COMMUNITY): Payer: Self-pay

## 2017-07-11 NOTE — Telephone Encounter (Signed)
CHF Clinic appointment reminder call placed to patient for upcoming post-hospital follow up.  Does understand purpose of this appointment and where CHF Clinic is located? Yes  How is patient feeling?  Well, no complaints  Does patient have all of their medications? Yes  Patient also reminded to take all medications as prescribed on the day of his/her appointment and to bring all medications to this appointment.  Advised to call our office for tardiness or cancellations/rescheduling needs.  Leory Plowman, Guinevere Ferrari

## 2017-07-12 ENCOUNTER — Encounter (HOSPITAL_COMMUNITY): Payer: Self-pay

## 2017-07-12 ENCOUNTER — Telehealth: Payer: Self-pay

## 2017-07-12 ENCOUNTER — Ambulatory Visit (HOSPITAL_COMMUNITY)
Admission: RE | Admit: 2017-07-12 | Discharge: 2017-07-12 | Disposition: A | Discharge status: Home / Self Care | Payer: Medicaid Other | Source: Ambulatory Visit | Attending: Internal Medicine | Admitting: Internal Medicine

## 2017-07-12 VITALS — BP 121/64 | HR 85 | Wt 179.2 lb

## 2017-07-12 DIAGNOSIS — I313 Pericardial effusion (noninflammatory): Secondary | ICD-10-CM | POA: Insufficient documentation

## 2017-07-12 DIAGNOSIS — K219 Gastro-esophageal reflux disease without esophagitis: Secondary | ICD-10-CM | POA: Diagnosis not present

## 2017-07-12 DIAGNOSIS — I2510 Atherosclerotic heart disease of native coronary artery without angina pectoris: Secondary | ICD-10-CM | POA: Diagnosis not present

## 2017-07-12 DIAGNOSIS — I2584 Coronary atherosclerosis due to calcified coronary lesion: Secondary | ICD-10-CM | POA: Insufficient documentation

## 2017-07-12 DIAGNOSIS — R221 Localized swelling, mass and lump, neck: Secondary | ICD-10-CM | POA: Diagnosis not present

## 2017-07-12 DIAGNOSIS — R590 Localized enlarged lymph nodes: Secondary | ICD-10-CM | POA: Diagnosis not present

## 2017-07-12 DIAGNOSIS — Z87891 Personal history of nicotine dependence: Secondary | ICD-10-CM | POA: Insufficient documentation

## 2017-07-12 DIAGNOSIS — Z720 Tobacco use: Secondary | ICD-10-CM | POA: Diagnosis not present

## 2017-07-12 DIAGNOSIS — I2699 Other pulmonary embolism without acute cor pulmonale: Secondary | ICD-10-CM | POA: Diagnosis not present

## 2017-07-12 DIAGNOSIS — I5022 Chronic systolic (congestive) heart failure: Secondary | ICD-10-CM | POA: Insufficient documentation

## 2017-07-12 DIAGNOSIS — Z7901 Long term (current) use of anticoagulants: Secondary | ICD-10-CM | POA: Insufficient documentation

## 2017-07-12 DIAGNOSIS — I429 Cardiomyopathy, unspecified: Secondary | ICD-10-CM | POA: Insufficient documentation

## 2017-07-12 DIAGNOSIS — I428 Other cardiomyopathies: Secondary | ICD-10-CM | POA: Diagnosis not present

## 2017-07-12 DIAGNOSIS — C801 Malignant (primary) neoplasm, unspecified: Secondary | ICD-10-CM | POA: Diagnosis not present

## 2017-07-12 LAB — BASIC METABOLIC PANEL
Anion gap: 9 (ref 5–15)
BUN: 21 mg/dL — AB (ref 6–20)
CHLORIDE: 100 mmol/L — AB (ref 101–111)
CO2: 26 mmol/L (ref 22–32)
Calcium: 9.7 mg/dL (ref 8.9–10.3)
Creatinine, Ser: 0.9 mg/dL (ref 0.44–1.00)
GFR calc Af Amer: >60 mL/min (ref >60)
GFR calc non Af Amer: >60 mL/min (ref >60)
Glucose, Bld: 116 mg/dL — ABNORMAL HIGH (ref 65–99)
POTASSIUM: 4.3 mmol/L (ref 3.5–5.1)
SODIUM: 135 mmol/L (ref 135–145)

## 2017-07-12 NOTE — Telephone Encounter (Signed)
Called pt to notify pt that she will not need to do lovenox bridge prior to her colonoscopy procedure, per Dr.Gudena. Pt verbalized understanding. Pt to stop eliquis 2-3 days before procedure. Notified and called Dr.Outlaw's office and lvm with his nurse.

## 2017-07-12 NOTE — Patient Instructions (Signed)
Routine lab work today. Will notify you of abnormal results, otherwise no news is good news!  Follow up 3 months with Dr. Haroldine Laws and echocardiogram. We will call you closer to this time, or you may call our office to schedule 1 month before you are due to be seen. Take all medication as prescribed the day of your appointment. Bring all medications with you to your appointment.  Do the following things EVERYDAY: 1) Weigh yourself in the morning before breakfast. Write it down and keep it in a log. 2) Take your medicines as prescribed 3) Eat low salt foods-Limit salt (sodium) to 2000 mg per day.  4) Stay as active as you can everyday 5) Limit all fluids for the day to less than 2 liters

## 2017-07-12 NOTE — Progress Notes (Signed)
Advanced Heart Failure Clinic Note   Primary Cardiologist: Dr. Meda Coffee Primary HF: Dr. Haroldine Laws   HPI:  Kylie Bennett is a 51 y.o. 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   Admitted 05/21/17 to 06/01/17 with SOB. Found to have bilateral PEs and right subclavian DVTs involving the head and neck.  as CT head/neck showed a mass of lymph nodes. US biopsy confirmed metastatic carcinoma.  Echo with depressed EF as below. She was diuresed with new HF this admission.  She diuresed over 20 L and down 40 lbs from admission. Discharge weight 182 lbs.   PET scan 06/13/17 without site of primary neoplasm.   Returns today for HF follow up. Feeling well, denies SOB. Plans for colonoscopy and upper GI study on 07/31/17. Denies chest pain, orthopnea, PND. Taking all medications.    Echo 05/22/2017  EF 15-20% with severe RV dysfunction. NICM  [facility] 05/27/17  Mid RCA lesion, 20 %stenosed.  Dist LAD lesion, 40 %stenosed. RHC hemodynamics AO = 123/89 (104) LV= 124/13 RA = 7 RV = 42/7 PA = 45/20 (29) PCW = 11 Fick cardiac output/index = 5.4/2.7 PVR = 3.3 WU SVR 1448 FA sat = 95% PA sat = 65%, 68%  CT chest 05/27/17 (Personally reviewed)  IMPRESSION: 1. Bilateral pulmonary emboli in the lower lobes and perhaps the right upper lobe. No heart strain identified. 2. Bibasilar pulmonary opacities. These opacities could represent pneumonia. Aspiration is possible. Pulmonary infarcts cannot be excluded given the pulmonary emboli. Recommend follow-up to resolution. 3. Coronary artery calcifications. 4. Mildly enlarged nodes and increased attenuation in the fat of the left axilla. This is a nonspecific finding. Recommend clinical correlation and short-term follow-up to resolution. 5. Shotty nodes in the mediastinum may be reactive given the findings in the lungs. 6. Small pleural effusions. Small pericardial  effusion.  Review of systems complete and found to be negative unless listed in HPI.    Past Medical History:  Diagnosis Date  . GERD (gastroesophageal reflux disease)   . Seasonal allergies     Current Outpatient Prescriptions  Medication Sig Dispense Refill  . acetaminophen (TYLENOL) 325 MG tablet Take 650 mg by mouth every 6 (six) hours as needed for mild pain.    Marland Kitchen apixaban (ELIQUIS) 5 MG TABS tablet Take 1 tablet (5 mg total) by mouth 2 (two) times daily. 180 tablet 3  . carvedilol (COREG) 3.125 MG tablet Take 1 tablet (3.125 mg total) by mouth 2 (two) times daily with a meal. 60 tablet 2  . digoxin (LANOXIN) 0.125 MG tablet Take 1 tablet (0.125 mg total) by mouth daily. 90 tablet 1  . fluticasone (FLONASE) 50 MCG/ACT nasal spray Place 2 sprays into both nostrils daily as needed for allergies.     . furosemide (LASIX) 20 MG tablet Take 1 tablet (20 mg total) by mouth daily. 90 tablet 1  . GuaiFENesin (MUCINEX PO) Take 1,500 mg by mouth daily.    . magnesium oxide (MAG-OX) 400 (241.3 Mg) MG tablet Take 1 tablet (400 mg total) by mouth 2 (two) times daily. 60 tablet 0  . omeprazole (PRILOSEC) 20 MG capsule Take 20 mg by mouth daily.    . sacubitril-valsartan (ENTRESTO) 49-51 MG Take 1 tablet by mouth 2 (two) times daily. 60 tablet 11  . spironolactone (ALDACTONE) 25 MG tablet Take 1 tablet (25 mg total) by mouth daily. 90 tablet 1   No current facility-administered medications  for this encounter.     Allergies  Allergen Reactions  . Unasyn [Ampicillin-Sulbactam Sodium] Other (See Comments)    Pt gets hot/flushed with infusion       Social History   Social History  . Marital status: Single    Spouse name: N/A  . Number of children: N/A  . Years of education: N/A   Occupational History  . Not on file.   Social History Main Topics  . Smoking status: Former Smoker    Packs/day: 1.00    Types: Cigarettes  . Smokeless tobacco: Never Used  . Alcohol use Yes  . Drug use:  No  . Sexual activity: Not on file   Other Topics Concern  . Not on file   Social History Narrative  . No narrative on file      Family History  Problem Relation Age of Onset  . COPD Mother   . Rheum arthritis Mother   . Breast cancer Mother   . Hypertension Father   . Skin cancer Father   . Heart attack Father        age 59s  . Heart attack Paternal Grandfather   . Stroke Maternal Grandfather     Vitals:   07/12/17 1338  BP: 121/64  Pulse: 85  SpO2: 100%  Weight: 179 lb 4 oz (81.3 kg)   Wt Readings from Last 3 Encounters:  07/12/17 179 lb 4 oz (81.3 kg)  06/12/17 176 lb 12.8 oz (80.2 kg)  06/07/17 176 lb 11.2 oz (80.2 kg)    PHYSICAL EXAM: General: Well appearing. No resp difficulty. HEENT: Normal Neck: Supple. JVP 7 cm Carotids 2+ bilat; no bruits. No thyromegaly or nodule noted. Cor: PMI nondisplaced. RRR, No M/G/R noted Lungs: Clear in upper lobes, diminished in bilateral bases.  Abdomen: Soft, non-tender, non-distended, no HSM. No bruits or masses. +BS  Extremities: No cyanosis, clubbing, rash, R and LLE no edema.  Neuro: Alert & orientedx3, cranial nerves grossly intact. moves all 4 extremities w/o difficulty. Affect pleasant   ASSESSMENT & PLAN:  1. Chronic systolic HF with biventricular dysfunction: Echo 05/22/17 with EF 15-20% with severe RV dysfunction. NICM.  - NYHA II - Volume stable on exam, continue lasix 20 mg daily - Continue Entresto 49/51 mg BID - Continue digoxin 0.125 mg daily - Continue Spiro 25 mg daily - Continue Coreg 3.125 mg BID - Bedside Echo today - RV looks normal now. LV appears better ~ 45%- 50%.  - Will leave meds the same for now as EF is recovering, BP stable.   2. RIJ/sublcavianDVT with bilateral PE  - VQ 05/23/17 with bilateral perfusion defects. - CT chest 05/27/17 with bilateral PE but no clot in main PAs. No RV strain - Continues on Eliquis, Dr. Haroldine Laws will discuss with Dr. Paulita Fujita and Dr. Lindi Adie, likely does not  need lovenox bridging prior to colonoscopy.    3. Tobacco use -  Continued abstinence.   4. Lymph node biopsy suggestive of metastatic carcinoma.  - IR saw during recent admission. S/P lymph node biopsy 05/30/17. Pathology concerning for metastatic carcinoma.  - No evidence of Breast CA on Mammogram or [place] 06/06/17. - CEA mildly positive and CA-125 positive. - PET scan without site of primary neoplasm. - Plan for colonoscopy and upper GI study as above. - Follows with Dr. Lindi Adie   5. Bilateral feet wounds - Resolved.  BMET today. Follow up in 3 months with Dr. Haroldine Laws with a formal Echo.   Arbutus Leas,  NP 07/12/17   Patient seen and examined with Jettie Booze, NP. We discussed all aspects of the encounter. I agree with the assessment and plan as stated above.   I performed bedside echo in clinic today. Image limited but EF appears nearly normal. RV normal. Functional status much improved. Will continue current therapy. Has colonscopy pending to assess for possible malignancy. I discussed with Dr. Lindi Adie. Does not need enoxaparin bridging. Will stop Eliquis 48 hours prior to procedure and restart as soon as possible post-procedure.   Total time spent 45 minutes. Over half that time spent discussing above.   Glori Bickers, MD  6:52 PM

## 2017-07-15 ENCOUNTER — Telehealth: Payer: Self-pay | Admitting: *Deleted

## 2017-07-15 NOTE — Telephone Encounter (Signed)
Received request for Medical records from [place], forwarded to [place] for email/scan/SLS 10/08

## 2017-07-24 MED FILL — ?CARVEDILOL 3.125 MG TABLET: 3.125 | 30 days supply | Qty: 60 | Fill #1

## 2017-07-24 MED FILL — ?FUROSEMIDE 20 MG TABLET: 20 | 30 days supply | Qty: 30 | Fill #1

## 2017-07-24 MED FILL — ?DIGITEK 125 MCG TABLET: 125 | 30 days supply | Qty: 30 | Fill #1

## 2017-07-24 MED FILL — ?SPIRONOLACTONE 25 MG TABLE: 25 | 30 days supply | Qty: 30 | Fill #1

## 2017-07-25 NOTE — Progress Notes (Signed)
Spoke with patient to confirm that she will be off of entresto 48hrs prior to upper endoscopy and colonoscopy (10/24). Patient instructed to resume day following procedure per Dr. Lindi Adie. Patient verbalized understanding.

## 2017-07-30 ENCOUNTER — Encounter (HOSPITAL_COMMUNITY): Payer: Self-pay | Admitting: *Deleted

## 2017-07-30 ENCOUNTER — Other Ambulatory Visit: Payer: Self-pay | Admitting: Gastroenterology

## 2017-07-30 NOTE — Progress Notes (Signed)
Anesthesia Chart Review:  Pt is a same day work up.   Pt is a 51 year old female scheduled for EGD, colonoscopy on 07/31/2017 with Arta Silence, MD  - PCP is [name], NP - Oncologist is Nicholas Lose, MD  - Cardiologist is Glori Bickers, MD who is aware of upcoming procedure. Last office visit note 07/12/17 documents "Bedside Echo today - RV looks normal now. LV appears better ~ 45%- 50%"  PMH includes:  Metastatic carcinoma (with currently unknown primary cancer), nonischemic cardiomyopathy, CHF, HTN, PE, DVT, GERD. Former smoker (quit July 2018).   - Hospitalized 8//14-25/18 for acute CHF, newly identified nonischemic cardiomyopathy (EF 15-20%), CAP, new bilateral PE along with R internal jugular and R subclavian DVTs, new diagnosis metastatic carcinoma of unknown primary site   Medications include: eliquis, carvedilol, digoxin, lasix, prilosec, entresto, spironolactone. Last dose eliquis 07/28/17.   Labs will be obtained day of procedure.   CT chest 05/26/17:  1. Bilateral pulmonary emboli in the lower lobes and perhaps the right upper lobe. No heart strain identified. 2. Bibasilar pulmonary opacities. These opacities could represent pneumonia. Aspiration is possible. Pulmonary infarcts cannot be excluded given the pulmonary emboli. Recommend follow-up to resolution. 3. Coronary artery calcifications. 4. Mildly enlarged nodes and increased attenuation in the fat of the left axilla. This is a nonspecific finding. Recommend clinical correlation and short-term follow-up to resolution. 5. Shotty nodes in the mediastinum may be reactive given the findings in the lungs. 6. Small pleural effusions.  Small pericardial effusion.  1 view CXR 05/26/17: Unchanged left lower lobe pneumonia.  EKG 06/12/17: NSR. Possible LA enlargement. LVH. T wave abnormality, consider lateral ischemia  CT soft tissue neck 05/28/17:  1. Positive for extensive venous thrombosis in the right head and neck:  Right sigmoid dural sinus thrombosis, right IJ thrombosis, right facial vein thrombosis. Patchy thrombus also in the left subclavian vein associated with # 2. 2. Indistinct soft tissue mass at the left thoracic inlet up to 2.7 cm most resembles a conglomeration of abnormal lymph nodes. No other neck mass or lymphadenopathy. 3. Consider Lymphoma and metastatic lymphadenopathy, and note also the presence of abnormal left axillary lymph nodes on the recent chest CTA. Lemierre's syndrome (Fusobacterium necrophorum throat infection) was also considered but felt unlikely given the absence of any neck inflammatory changes outside of the left thoracic inlet.  R/L cardiac cath 05/27/17:   Mid RCA lesion, 20 %stenosed.  Dist LAD lesion, 40 %stenosed. - Findings: AO = 123/89 (104) LV=   124/13 RA =  7 RV = 42/7 PA = 45/20 (29) PCW = 11 Fick cardiac output/index = 5.4/2.7 PVR = 3.3 WU SVR 1448 FA sat = 95% PA sat = 65%, 68% Assessment: 1. Mild non obstructive CAD 2. Severe NICM with EF 15% by echo 3. Well-compensated hemodynamics Plan/Discussion: Hemodynamics better than I expected. Continue to titrate medical therapy. Will switch heparin to apixaban (PE dosing) starting tonight.   [place] upper extremity 05/25/17:  - Findings consistent with acute deep vein thrombosis involving the internal jugular and subclavian veins of the right upper extremity.  [place] lower extremity 05/25/17:  - No evidence of deep vein or superficial thrombosis involving the right lower extremity and left lower extremity. - Interstitial fluid noted in the right calf. - Interstitial fluid noted in the left calf.  V/Q scan 05/23/17: Findings are consistent with high probability for B pulmonary embolus.  Echo 05/22/17:  - Left ventricle: The cavity size was severely dilated. There was  mild concentric hypertrophy. Systolic function was normal. The estimated ejection fraction was in the range of 15% to 20%. Diffuse hypokinesis.  Doppler parameters are consistent with restrictive physiology, indicative of decreased left ventricular diastolic compliance and/or increased left atrial pressure. Doppler parameters are consistent with elevated ventricular end-diastolic filling pressure. - Mitral valve: There was mild regurgitation. - Left atrium: The atrium was severely dilated. - Right ventricle: The cavity size was severely dilated. Wall thickness was normal. Systolic function was moderately reduced. - Right atrium: The atrium was moderately dilated. - Tricuspid valve: There was severe regurgitation. - Pulmonary arteries: Systolic pressure was moderately increased. PA peak pressure: 46 mm Hg (S). - IMPRESSION: Severe LV dilatation and systolic dysfunction with LVEF 15-20%, diffuse hypokinesis, grade 3 diastolic dysfunction with elevated filling pressures. Severe RV dilatation with moderate systolic dysfunction, RVSP 46 mmHg. Severely dilated left ventricle and left atrium together with grade 3 diastolic dysfunction consistent with a chronic process rather than acute etiology.  If labs acceptable day of procedure, I anticipate pt can proceed as scheduled.   Willeen Cass, FNP-BC [facility]/Anesthesiology Phone: [phone] 07/30/2017 1:01 PM

## 2017-07-30 NOTE — Progress Notes (Signed)
Pt denies any acute cardiopulmonary issues. Pt under the care of Dr. Haroldine Laws, Cardiology. Pt stated that her last dose of Eliquis was Sunday night. Pt made aware to stop taking  Aspirin, vitamins, fish oil, and herbal medications. Do not take any NSAIDs ie: Ibuprofen, Advil, Naproxen (Aleve), Motrin BC and Goody Powder. Pt verbalized understanding of all pre-op instructions. Anesthesia asked to review pt history.

## 2017-07-31 ENCOUNTER — Ambulatory Visit (HOSPITAL_COMMUNITY)
Admission: RE | Admit: 2017-07-31 | Discharge: 2017-07-31 | Disposition: A | Discharge status: Home / Self Care | Payer: Medicaid Other | Source: Ambulatory Visit | Attending: Gastroenterology | Admitting: Gastroenterology

## 2017-07-31 ENCOUNTER — Encounter (HOSPITAL_COMMUNITY): Payer: Self-pay | Admitting: Certified Registered Nurse Anesthetist

## 2017-07-31 ENCOUNTER — Encounter (HOSPITAL_COMMUNITY)
Admission: RE | Disposition: A | Discharge status: Home / Self Care | Payer: Self-pay | Source: Ambulatory Visit | Attending: Gastroenterology

## 2017-07-31 ENCOUNTER — Telehealth: Payer: Self-pay | Admitting: Hematology and Oncology

## 2017-07-31 ENCOUNTER — Ambulatory Visit (HOSPITAL_COMMUNITY): Payer: Medicaid Other | Admitting: Emergency Medicine

## 2017-07-31 DIAGNOSIS — Z859 Personal history of malignant neoplasm, unspecified: Secondary | ICD-10-CM | POA: Insufficient documentation

## 2017-07-31 DIAGNOSIS — I429 Cardiomyopathy, unspecified: Secondary | ICD-10-CM | POA: Insufficient documentation

## 2017-07-31 DIAGNOSIS — Z79899 Other long term (current) drug therapy: Secondary | ICD-10-CM | POA: Diagnosis not present

## 2017-07-31 DIAGNOSIS — K648 Other hemorrhoids: Secondary | ICD-10-CM | POA: Insufficient documentation

## 2017-07-31 DIAGNOSIS — I110 Hypertensive heart disease with heart failure: Secondary | ICD-10-CM | POA: Insufficient documentation

## 2017-07-31 DIAGNOSIS — Z86718 Personal history of other venous thrombosis and embolism: Secondary | ICD-10-CM | POA: Insufficient documentation

## 2017-07-31 DIAGNOSIS — I509 Heart failure, unspecified: Secondary | ICD-10-CM | POA: Insufficient documentation

## 2017-07-31 DIAGNOSIS — C799 Secondary malignant neoplasm of unspecified site: Secondary | ICD-10-CM | POA: Diagnosis not present

## 2017-07-31 DIAGNOSIS — Z791 Long term (current) use of non-steroidal anti-inflammatories (NSAID): Secondary | ICD-10-CM | POA: Insufficient documentation

## 2017-07-31 DIAGNOSIS — R933 Abnormal findings on diagnostic imaging of other parts of digestive tract: Secondary | ICD-10-CM | POA: Diagnosis not present

## 2017-07-31 DIAGNOSIS — K219 Gastro-esophageal reflux disease without esophagitis: Secondary | ICD-10-CM | POA: Diagnosis not present

## 2017-07-31 DIAGNOSIS — Z87891 Personal history of nicotine dependence: Secondary | ICD-10-CM | POA: Insufficient documentation

## 2017-07-31 HISTORY — DX: Other pulmonary embolism without acute cor pulmonale: I26.99

## 2017-07-31 HISTORY — PX: ESOPHAGOGASTRODUODENOSCOPY (EGD) WITH PROPOFOL: SHX5813

## 2017-07-31 HISTORY — DX: Malignant (primary) neoplasm, unspecified: C80.1

## 2017-07-31 HISTORY — DX: Essential (primary) hypertension: I10

## 2017-07-31 HISTORY — DX: Heart failure, unspecified: I50.9

## 2017-07-31 HISTORY — DX: Acute embolism and thrombosis of unspecified deep veins of unspecified lower extremity: I82.409

## 2017-07-31 HISTORY — DX: Diarrhea, unspecified: R19.7

## 2017-07-31 HISTORY — PX: COLONOSCOPY WITH PROPOFOL: SHX5780

## 2017-07-31 SURGERY — ESOPHAGOGASTRODUODENOSCOPY (EGD) WITH PROPOFOL
Anesthesia: Monitor Anesthesia Care

## 2017-07-31 MED ORDER — SODIUM CHLORIDE 0.9 % IV SOLN: INTRAVENOUS | Status: DC

## 2017-07-31 MED ORDER — MIDAZOLAM HCL 2 MG/2ML IJ SOLN
INTRAMUSCULAR | Status: DC | PRN
  Administered 2017-07-31: 2 mg via INTRAVENOUS

## 2017-07-31 MED ORDER — GLYCOPYRROLATE 0.2 MG/ML IJ SOLN
INTRAMUSCULAR | Status: DC | PRN
  Administered 2017-07-31: 0.2 mg via INTRAVENOUS

## 2017-07-31 MED ORDER — LIDOCAINE HCL (CARDIAC) 20 MG/ML IV SOLN
INTRAVENOUS | Status: DC | PRN
  Administered 2017-07-31: 80 mg via INTRAVENOUS

## 2017-07-31 MED ORDER — MIDAZOLAM HCL 2 MG/2ML IJ SOLN
INTRAMUSCULAR | Status: AC
  Filled 2017-07-31: qty 2

## 2017-07-31 MED ORDER — PROPOFOL 10 MG/ML IV BOLUS
INTRAVENOUS | Status: DC | PRN
  Administered 2017-07-31 (×2): 40 mg via INTRAVENOUS
  Administered 2017-07-31 (×3): 20 mg via INTRAVENOUS
  Administered 2017-07-31: 50 mg via INTRAVENOUS
  Administered 2017-07-31: 20 mg via INTRAVENOUS
  Administered 2017-07-31: 50 mg via INTRAVENOUS
  Administered 2017-07-31: 40 mg via INTRAVENOUS
  Administered 2017-07-31: 20 mg via INTRAVENOUS
  Administered 2017-07-31: 40 mg via INTRAVENOUS
  Administered 2017-07-31: 50 mg via INTRAVENOUS

## 2017-07-31 MED ORDER — PROPOFOL 500 MG/50ML IV EMUL
INTRAVENOUS | Status: DC | PRN
  Administered 2017-07-31: 50 ug/kg/min via INTRAVENOUS

## 2017-07-31 MED ORDER — FENTANYL CITRATE (PF) 100 MCG/2ML IJ SOLN
INTRAMUSCULAR | Status: DC | PRN
  Administered 2017-07-31 (×2): 50 ug via INTRAVENOUS

## 2017-07-31 MED ORDER — LACTATED RINGERS IV SOLN
INTRAVENOUS | Status: DC
  Administered 2017-07-31: 1000 mL via INTRAVENOUS
  Administered 2017-07-31: 09:00:00 via INTRAVENOUS

## 2017-07-31 MED ORDER — FENTANYL CITRATE (PF) 250 MCG/5ML IJ SOLN
INTRAMUSCULAR | Status: AC
  Filled 2017-07-31: qty 5

## 2017-07-31 SURGICAL SUPPLY — 24 items

## 2017-07-31 NOTE — Telephone Encounter (Signed)
Scheduled appt per per 10/24 sch message - patient is aware of appt date and time.

## 2017-07-31 NOTE — Anesthesia Preprocedure Evaluation (Signed)
Anesthesia Evaluation  Patient identified by MRN, date of birth, ID band Patient awake    Reviewed: Allergy & Precautions, NPO status , Patient's Chart, lab work & pertinent test results, reviewed documented beta blocker date and time   Airway Mallampati: II  TM Distance: >3 FB Neck ROM: Full    Dental no notable dental hx.    Pulmonary neg pulmonary ROS, former smoker,    Pulmonary exam normal breath sounds clear to auscultation       Cardiovascular hypertension, Pt. on medications and Pt. on home beta blockers +CHF  negative cardio ROS Normal cardiovascular exam Rhythm:Regular Rate:Normal     Neuro/Psych negative neurological ROS  negative psych ROS   GI/Hepatic negative GI ROS, Neg liver ROS, GERD  ,  Endo/Other  negative endocrine ROS  Renal/GU negative Renal ROS  negative genitourinary   Musculoskeletal negative musculoskeletal ROS (+)   Abdominal   Peds negative pediatric ROS (+)  Hematology negative hematology ROS (+)   Anesthesia Other Findings Metastatic carcinoma with unknown primary  Reproductive/Obstetrics negative OB ROS                             Anesthesia Physical Anesthesia Plan  ASA: IV  Anesthesia Plan: MAC   Post-op Pain Management:    Induction: Intravenous  PONV Risk Score and Plan: 2 and Ondansetron, Midazolam and Treatment may vary due to age or medical condition  Airway Management Planned: Nasal Cannula  Additional Equipment:   Intra-op Plan:   Post-operative Plan:   Informed Consent: I have reviewed the patients History and Physical, chart, labs and discussed the procedure including the risks, benefits and alternatives for the proposed anesthesia with the patient or authorized representative who has indicated his/her understanding and acceptance.   Dental advisory given  Plan Discussed with: CRNA  Anesthesia Plan Comments:          Anesthesia Quick Evaluation

## 2017-07-31 NOTE — Transfer of Care (Signed)
Immediate Anesthesia Transfer of Care Note  Patient: Doree Kuehne  Procedure(s) Performed: ESOPHAGOGASTRODUODENOSCOPY (EGD) WITH PROPOFOL (N/A ) COLONOSCOPY WITH PROPOFOL (N/A )  Patient Location: Endoscopy Unit  Anesthesia Type:MAC  Level of Consciousness: awake, alert  and oriented  Airway & Oxygen Therapy: Patient Spontanous Breathing and Patient connected to nasal cannula oxygen  Post-op Assessment: Report given to RN and Post -op Vital signs reviewed and stable  Post vital signs: Reviewed and stable  Last Vitals:  Vitals:   07/31/17 0844 07/31/17 1014  BP: (!) 165/97 (!) 147/84  Pulse:  (!) 106  Resp: 14 14  Temp: 36.9 C (!) 36.4 C  SpO2: 100% 100%    Last Pain:  Vitals:   07/31/17 1014  TempSrc: Oral         Complications: No apparent anesthesia complications

## 2017-07-31 NOTE — Op Note (Signed)
[facility] Patient Name: Kylie Bennett Procedure Date : 07/31/2017 MRN: [id number] Attending MD: Arta Silence , MD Date of Birth: 01-17-1966 CSN: [id number] Age: 51 Admit Type: Outpatient Procedure:                Colonoscopy Indications:              This is the patient's first colonoscopy, Personal                            history of malignant neoplasm, Abnormal PET scan of                            the GI tract Providers:                Arta Silence, MD, Burtis Junes, RN, Elspeth Cho                            Tech., Technician, Raphael Gibney, CRNA Referring MD:             Dr. Lindi Adie Medicines:                Monitored Anesthesia Care Complications:            No immediate complications. Estimated Blood Loss:     Estimated blood loss: none. Procedure:                Pre-Anesthesia Assessment:                           - Prior to the procedure, a History and Physical                            was performed, and patient medications and                            allergies were reviewed. The patient's tolerance of                            previous anesthesia was also reviewed. The risks                            and benefits of the procedure and the sedation                            options and risks were discussed with the patient.                            All questions were answered, and informed consent                            was obtained. Prior Anticoagulants: The patient has                            taken Eliquis (apixaban), last dose was 4 days  prior to procedure. ASA Grade Assessment: IV - A                            patient with severe systemic disease that is a                            constant threat to life. After reviewing the risks                            and benefits, the patient was deemed in                            satisfactory condition to undergo the procedure.                           After  obtaining informed consent, the colonoscope                            was passed under direct vision. Throughout the                            procedure, the patient's blood pressure, pulse, and                            oxygen saturations were monitored continuously. The                            [id number] ([id number]) scope was introduced through                            the anus and advanced to the the cecum, identified                            by appendiceal orifice and ileocecal valve. The                            ileocecal valve, appendiceal orifice, and rectum                            were photographed. The entire colon was examined.                            The colonoscopy was performed without difficulty.                            The patient tolerated the procedure well. The                            quality of the bowel preparation was good. Scope In: 9:51:17 AM Scope Out: 10:07:55 AM Scope Withdrawal Time: 0 hours 13 minutes 11 seconds  Total Procedure Duration: 0 hours 16 minutes 38 seconds  Findings:      The perianal and digital rectal examinations were normal.      Non-bleeding hemorrhoids  were found during retroflexion. The hemorrhoids       were mild.      No additional abnormalities were found on retroflexion.      Colon otherwise normal; no other polyps, masses, vascular ectasias, or       inflammatory changes were seen. Impression:               - Non-bleeding internal hemorrhoids.                           - Otherwise normal colonoscopy; no source of                            adenocarcinoma was identified. Moderate Sedation:      None Recommendation:           - Patient has a contact number available for                            emergencies. The signs and symptoms of potential                            delayed complications were discussed with the                            patient. Return to normal activities tomorrow.                             Written discharge instructions were provided to the                            patient.                           - Discharge patient to home (via wheelchair).                           - Resume Eliquis (apixaban) at prior dose today.                           - Return to GI clinic PRN.                           - Return to referring physician (Dr. Lindi Adie) as                            previously scheduled.                           - Soft diet today.                           - No repeat colonoscopy. Procedure Code(s):        --- Professional ---                           [phone], Colonoscopy, flexible; diagnostic, including  collection of specimen(s) by brushing or washing,                            when performed (separate procedure) Diagnosis Code(s):        --- Professional ---                           K64.9, Unspecified hemorrhoids                           Z85.9, Personal history of malignant neoplasm,                            unspecified                           R93.3, Abnormal findings on diagnostic imaging of                            other parts of digestive tract CPT copyright 2016 [employer]. All rights reserved. The codes documented in this report are preliminary and upon coder review may  be revised to meet current compliance requirements. Arta Silence, MD 07/31/2017 10:30:59 AM This report has been signed electronically. Number of Addenda: 0

## 2017-07-31 NOTE — Anesthesia Postprocedure Evaluation (Signed)
Anesthesia Post Note  Patient: Ramona Ruark  Procedure(s) Performed: ESOPHAGOGASTRODUODENOSCOPY (EGD) WITH PROPOFOL (N/A ) COLONOSCOPY WITH PROPOFOL (N/A )     Patient location during evaluation: PACU Anesthesia Type: MAC Level of consciousness: awake and alert Pain management: pain level controlled Vital Signs Assessment: post-procedure vital signs reviewed and stable Respiratory status: spontaneous breathing, nonlabored ventilation and respiratory function stable Cardiovascular status: stable and blood pressure returned to baseline Postop Assessment: no apparent nausea or vomiting Anesthetic complications: no    Last Vitals:  Vitals:   07/31/17 1020 07/31/17 1030  BP: (!) 166/101 (!) 147/104  Pulse: 98 87  Resp: 14 15  Temp:    SpO2: 100% 100%    Last Pain:  Vitals:   07/31/17 1014  TempSrc: Oral                 Lynda Rainwater

## 2017-07-31 NOTE — Op Note (Signed)
[facility] Patient Name: Kylie Bennett Procedure Date : 07/31/2017 MRN: [id number] Attending MD: Arta Silence , MD Date of Birth: [birth year] CSN: [id number] Age: 51 Admit Type: Outpatient Procedure:                Upper GI endoscopy Indications:              Abnormal PET scan of the GI tract, Personal history                            of malignant neoplasm (adenocarcinoma of unknown                            primary) Providers:                Arta Silence, MD, Burtis Junes, RN, Elspeth Cho                            Tech., Technician, Raphael Gibney, CRNA Referring MD:              Medicines:                Monitored Anesthesia Care Complications:            No immediate complications. Estimated Blood Loss:     Estimated blood loss: none. Procedure:                Pre-Anesthesia Assessment:                           - Prior to the procedure, a History and Physical                            was performed, and patient medications and                            allergies were reviewed. The patient's tolerance of                            previous anesthesia was also reviewed. The risks                            and benefits of the procedure and the sedation                            options and risks were discussed with the patient.                            All questions were answered, and informed consent                            was obtained. Prior Anticoagulants: The patient has                            taken Eliquis (apixaban), last dose was 4 days  prior to procedure. ASA Grade Assessment: IV - A                            patient with severe systemic disease that is a                            constant threat to life. After reviewing the risks                            and benefits, the patient was deemed in                            satisfactory condition to undergo the procedure.                           After  obtaining informed consent, the endoscope was                            passed under direct vision. Throughout the                            procedure, the patient's blood pressure, pulse, and                            oxygen saturations were monitored continuously. The                            [id number] ([id number]) scope was introduced through the                            mouth, and advanced to the second part of duodenum.                            The upper GI endoscopy was accomplished without                            difficulty. The patient tolerated the procedure                            well. Scope In: Scope Out: Findings:      The examined esophagus was normal.      The entire examined stomach was normal.      The duodenal bulb, first portion of the duodenum and second portion of       the duodenum were normal. Impression:               - Normal esophagus.                           - Normal stomach.                           - Normal duodenal bulb, first portion of the  duodenum and second portion of the duodenum.                           - No source of malignancy identified. Moderate Sedation:      None Recommendation:           - Perform a colonoscopy today.                           - Make the patient NPO until procedures are done.                           - Continue present medications. Procedure Code(s):        --- Professional ---                           [phone], Esophagogastroduodenoscopy, flexible,                            transoral; diagnostic, including collection of                            specimen(s) by brushing or washing, when performed                            (separate procedure) Diagnosis Code(s):        --- Professional ---                           Z85.9, Personal history of malignant neoplasm,                            unspecified                           R93.3, Abnormal findings on diagnostic imaging of                             other parts of digestive tract CPT copyright 2016 American [employer]. All rights reserved. The codes documented in this report are preliminary and upon coder review may  be revised to meet current compliance requirements. Arta Silence, MD 07/31/2017 10:27:45 AM This report has been signed electronically. Number of Addenda: 0

## 2017-07-31 NOTE — H&P (Signed)
Patient interval history reviewed.  Patient examined again.  There has been no change from documented H/P dated 07/30/17 (scanned into chart from our office) except as documented above.  Assessment:  1.  Metastatic adenocarcinoma unknown primary.  Plan:  1.  Endoscopy. 2.  Risks (bleeding, infection, bowel perforation that could require surgery, sedation-related changes in cardiopulmonary systems), benefits (identification and possible treatment of source of symptoms, exclusion of certain causes of symptoms), and alternatives (watchful waiting, radiographic imaging studies, empiric medical treatment) of upper endoscopy (EGD) were explained to patient/family in detail and patient wishes to proceed. 3.  Colonoscopy. 4.  Risks (bleeding, infection, bowel perforation that could require surgery, sedation-related changes in cardiopulmonary systems), benefits (identification and possible treatment of source of symptoms, exclusion of certain causes of symptoms), and alternatives (watchful waiting, radiographic imaging studies, empiric medical treatment) of colonoscopy were explained to patient/family in detail and patient wishes to proceed.

## 2017-07-31 NOTE — Discharge Instructions (Signed)

## 2017-08-03 ENCOUNTER — Encounter (HOSPITAL_COMMUNITY): Payer: Self-pay | Admitting: Gastroenterology

## 2017-08-05 ENCOUNTER — Ambulatory Visit: Payer: Self-pay | Admitting: Family Medicine

## 2017-08-08 ENCOUNTER — Other Ambulatory Visit: Payer: Self-pay

## 2017-08-08 ENCOUNTER — Encounter: Payer: Self-pay | Admitting: Hematology and Oncology

## 2017-08-08 ENCOUNTER — Ambulatory Visit (HOSPITAL_BASED_OUTPATIENT_CLINIC_OR_DEPARTMENT_OTHER): Payer: Medicaid Other | Admitting: Hematology and Oncology

## 2017-08-08 DIAGNOSIS — C770 Secondary and unspecified malignant neoplasm of lymph nodes of head, face and neck: Secondary | ICD-10-CM | POA: Diagnosis not present

## 2017-08-08 DIAGNOSIS — C801 Malignant (primary) neoplasm, unspecified: Secondary | ICD-10-CM

## 2017-08-08 DIAGNOSIS — I82409 Acute embolism and thrombosis of unspecified deep veins of unspecified lower extremity: Secondary | ICD-10-CM | POA: Diagnosis not present

## 2017-08-08 NOTE — Assessment & Plan Note (Signed)
Carcinoma of unknown primary: Patient presented with right supraclavicular lymph node conglomeration measuring 2.7 cm in size biopsy-proven metastatic carcinoma. Immunohistochemical stains did not identify a clear-cut etiology. positive for pancytokeratin and cytokeratin 7. negative for cytokeratin 20, GATA-3, GCDFP, ER, PR, and TTF-1. CD68 and CD45 highlight surrounding lymphocytes and histiocytes. Mammogram and ultrasound negative for breast cancer  Differential diagnosis: Gastrointestinal tumors. Recommendation: PET/CT scan  Severe and extensive DVTs: Continue with anticoagulation with Apixaban.  Return to clinic after PET/CT scan to discuss the results. Patient may need upper endoscopy for further evaluation.

## 2017-08-08 NOTE — Progress Notes (Signed)
Patient Care Team: Scot Jun, FNP as PCP - General (Family Medicine)  DIAGNOSIS:  Encounter Diagnosis  Name Primary?  . Carcinoma of unknown primary ([place])     SUMMARY OF ONCOLOGIC HISTORY:   Carcinoma of unknown primary ([place])   05/28/2017 Imaging    Extensive thrombosis in the right head and neck, sigmoid dural sinus thrombosis, right IJ thrombus, right facial vein thrombosis, left subclavian vein thrombosis; soft tissue left thoracic inlet 2.7 cm conglomeration of lymph nodes      05/30/2017 Initial Diagnosis    Left supraclavicular lymph node biopsy: Metastatic carcinoma positive for pancytokeratin and cytokeratin 7. negative for cytokeratin 20, GATA-3, GCDFP, ER, PR, and TTF-1. CD68 and CD45 highlight surrounding lymphocytes and histiocytes       CHIEF COMPLIANT: Left supraclavicular tumor unknown primary  INTERVAL HISTORY: Aela Bohan is a 51 year old with above-mentioned history of left supraclavicular lymph node 8 mm in size biopsy proven to be metastatic cancer we do not have a primary identified in spite of extensive workup including breast MRIs and PET CT scans upper endoscopy and colonoscopy all of which were negative.  Tumor markers showed elevation of CA 125 as well as mildly elevated CEA.  She has no clinical symptoms of any cancer.  REVIEW OF SYSTEMS:   Constitutional: Denies fevers, chills or abnormal weight loss Eyes: Denies blurriness of vision Ears, nose, mouth, throat, and face: Denies mucositis or sore throat Respiratory: Denies cough, dyspnea or wheezes Cardiovascular: Denies palpitation, chest discomfort Gastrointestinal:  Denies nausea, heartburn or change in bowel habits Skin: Denies abnormal skin rashes Lymphatics: Denies new lymphadenopathy or easy bruising Neurological:Denies numbness, tingling or new weaknesses Behavioral/Psych: Mood is stable, no new changes  Extremities: No lower extremity edema All other systems were reviewed with the  patient and are negative.  I have reviewed the past medical history, past surgical history, social history and family history with the patient and they are unchanged from previous note.  ALLERGIES:  is allergic to unasyn [ampicillin-sulbactam sodium].  MEDICATIONS:  Current Outpatient Prescriptions  Medication Sig Dispense Refill  . acetaminophen (TYLENOL) 325 MG tablet Take 650 mg by mouth every 6 (six) hours as needed for mild pain.    Marland Kitchen apixaban (ELIQUIS) 5 MG TABS tablet Take 1 tablet (5 mg total) by mouth 2 (two) times daily. 180 tablet 3  . carvedilol (COREG) 3.125 MG tablet Take 1 tablet (3.125 mg total) by mouth 2 (two) times daily with a meal. 60 tablet 2  . digoxin (LANOXIN) 0.125 MG tablet Take 1 tablet (0.125 mg total) by mouth daily. 90 tablet 1  . fluticasone (FLONASE) 50 MCG/ACT nasal spray Place 2 sprays into both nostrils daily as needed for allergies.     . furosemide (LASIX) 20 MG tablet Take 1 tablet (20 mg total) by mouth daily. 90 tablet 1  . GuaiFENesin (MUCINEX PO) Take 1,500 mg by mouth daily.    . magnesium oxide (MAG-OX) 400 (241.3 Mg) MG tablet Take 1 tablet (400 mg total) by mouth 2 (two) times daily. 60 tablet 0  . omeprazole (PRILOSEC) 20 MG capsule Take 20 mg by mouth daily.    . sacubitril-valsartan (ENTRESTO) 49-51 MG Take 1 tablet by mouth 2 (two) times daily. 60 tablet 11  . spironolactone (ALDACTONE) 25 MG tablet Take 1 tablet (25 mg total) by mouth daily. 90 tablet 1   No current facility-administered medications for this visit.     PHYSICAL EXAMINATION: ECOG PERFORMANCE STATUS: 1 - Symptomatic but  completely ambulatory  Vitals:   08/08/17 1459  BP: (!) 141/88  Pulse: 93  Resp: 20  Temp: 97.9 F (36.6 C)  SpO2: 100%   Filed Weights   08/08/17 1459  Weight: 183 lb 12.8 oz (83.4 kg)    GENERAL:alert, no distress and comfortable SKIN: skin color, texture, turgor are normal, no rashes or significant lesions EYES: normal, Conjunctiva are  pink and non-injected, sclera clear OROPHARYNX:no exudate, no erythema and lips, buccal mucosa, and tongue normal  NECK: supple, thyroid normal size, non-tender, without nodularity LYMPH:  no palpable lymphadenopathy in the cervical, axillary or inguinal LUNGS: clear to auscultation and percussion with normal breathing effort HEART: regular rate & rhythm and no murmurs and no lower extremity edema ABDOMEN:abdomen soft, non-tender and normal bowel sounds MUSCULOSKELETAL:no cyanosis of digits and no clubbing  NEURO: alert & oriented x 3 with fluent speech, no focal motor/sensory deficits EXTREMITIES: No lower extremity edema  LABORATORY DATA:  I have reviewed the data as listed   Chemistry      Component Value Date/Time   NA 135 07/12/2017 1413   K 4.3 07/12/2017 1413   CL 100 (L) 07/12/2017 1413   CO2 26 07/12/2017 1413   BUN 21 (H) 07/12/2017 1413   CREATININE 0.90 07/12/2017 1413      Component Value Date/Time   CALCIUM 9.7 07/12/2017 1413   ALKPHOS 67 05/28/2017 0509   AST 33 05/28/2017 0509   ALT 17 05/28/2017 0509   BILITOT 1.0 05/28/2017 0509       Lab Results  Component Value Date   WBC 8.9 06/01/2017   HGB 11.9 (L) 06/01/2017   HCT 40.3 06/01/2017   MCV 88.6 06/01/2017   PLT 636 (H) 06/01/2017   NEUTROABS 11.2 (H) 05/28/2017    ASSESSMENT & PLAN:  Carcinoma of unknown primary ([place]) Carcinoma of unknown primary: Patient presented with left supraclavicular lymph node conglomeration measuring 2.7 cm in size biopsy-proven metastatic carcinoma. Immunohistochemical stains did not identify a clear-cut etiology. positive for pancytokeratin and cytokeratin 7. negative for cytokeratin 20, GATA-3, GCDFP, ER, PR, and TTF-1. CD68 and CD45 highlight surrounding lymphocytes and histiocytes. Mammogram and ultrasound negative for breast cancer  Differential diagnosis: Gastrointestinal tumors. PET/CT scan: Mild increase in activity in the right supraclavicular lymph node 8 mm  in size no other evidence of hypermetabolic disease Tumor markers: CA 125: 251 CEA 7.28 AFP normal CA 19-9 normal CA 27-29: 49  Plan: 1.  [facility] 2. surgery referral regarding supraclavicular lymph node excision  Severe and extensive DVTs: Continue with anticoagulation with Apixaban.  Return to clinic after these consultations to discuss a final treatment plan. Potential treatment options could include excision of the left supraclavicular lymph node followed by radiation followed by systemic chemotherapy with Taxol and carboplatin x6 cycles I called and discussed the case with Dr. Donne Hazel.  He will see the patient evaluate for supraclavicular excisional biopsy.  I will also evaluate to see if it can be sent for foundation 1 testing as well as cancer type ID.  I spent 25 minutes talking to the patient of which more than half was spent in counseling and coordination of care.  No orders of the defined types were placed in this encounter.  The patient has a good understanding of the overall plan. she agrees with it. she will call with any problems that may develop before the next visit here.   Rulon Eisenmenger, MD 08/08/17

## 2017-08-09 ENCOUNTER — Telehealth: Payer: Self-pay

## 2017-08-09 NOTE — Telephone Encounter (Signed)
Called pt and lvm with appointment for Dr.Lowe ([facility] Pt to have [place] of pelvic. Told pt a referral was sent to Dr.Wakefield as well. LVM and for pt to call back to confirm vm message.

## 2017-08-09 NOTE — Telephone Encounter (Signed)
Called Dr.Wakefield's office and lvm to set up appt for pt referral. Call back number provided to return call with confirmed time/date.

## 2017-08-12 ENCOUNTER — Telehealth: Payer: Self-pay | Admitting: Hematology and Oncology

## 2017-08-12 ENCOUNTER — Other Ambulatory Visit: Payer: Self-pay

## 2017-08-12 ENCOUNTER — Telehealth: Payer: Self-pay

## 2017-08-12 DIAGNOSIS — C801 Malignant (primary) neoplasm, unspecified: Secondary | ICD-10-CM

## 2017-08-12 NOTE — Telephone Encounter (Signed)
Referral sent for Thoracic surgery, called office and they have received and will be working on it.  Cyndia Bent RN

## 2017-08-12 NOTE — Telephone Encounter (Signed)
Per 11/1 - no los at checkout.

## 2017-08-13 ENCOUNTER — Telehealth: Payer: Self-pay

## 2017-08-13 NOTE — Telephone Encounter (Signed)
Spoke with Jeannie Done and informed her she needs to be seen by a thoracic surgeon and a referral was sent to [facility].  Cyndia Bent RN

## 2017-08-15 ENCOUNTER — Encounter: Payer: Medicaid Other | Admitting: Cardiothoracic Surgery

## 2017-08-20 ENCOUNTER — Ambulatory Visit: Payer: Self-pay | Admitting: Family Medicine

## 2017-08-21 ENCOUNTER — Encounter: Payer: Medicaid Other | Admitting: Cardiothoracic Surgery

## 2017-08-21 MED FILL — ?CARVEDILOL 3.125 MG TABLET: 3.125 | 30 days supply | Qty: 60 | Fill #2

## 2017-08-21 MED FILL — ?SPIRONOLACTONE 25 MG TABLE: 25 | 30 days supply | Qty: 30 | Fill #2 | Status: TO

## 2017-08-21 MED FILL — ?DIGITEK 125 MCG TABLET: 125 | 30 days supply | Qty: 30 | Fill #2 | Status: TO

## 2017-08-21 MED FILL — ?FUROSEMIDE 20 MG TABLET: 20 | 30 days supply | Qty: 30 | Fill #2 | Status: TO

## 2017-08-26 ENCOUNTER — Telehealth: Payer: Self-pay

## 2017-08-26 NOTE — Telephone Encounter (Signed)
LVM to follow up on appt with thoracic surgeon she no showed last week.  Cyndia Bent RN

## 2017-09-03 ENCOUNTER — Encounter: Payer: Self-pay | Admitting: Cardiothoracic Surgery

## 2017-09-03 ENCOUNTER — Institutional Professional Consult (permissible substitution): Payer: Medicaid Other | Admitting: Cardiothoracic Surgery

## 2017-09-03 VITALS — BP 138/90 | HR 88 | Resp 16 | Ht 67.0 in | Wt 185.0 lb

## 2017-09-03 DIAGNOSIS — C799 Secondary malignant neoplasm of unspecified site: Secondary | ICD-10-CM

## 2017-09-03 DIAGNOSIS — C801 Malignant (primary) neoplasm, unspecified: Secondary | ICD-10-CM | POA: Diagnosis not present

## 2017-09-03 NOTE — Progress Notes (Signed)
[name] [facility] Medical Record #[id number] Date of Birth: 04-30-1966  Referring: Nicholas Lose, MD Primary Care: Scot Jun, FNP  Chief Complaint:    Chief Complaint  Patient presents with  . Referral    for metastic cancer of unknown primary after bx...CT CHEST 8/19, PET 06/13/17,     History of Present Illness:    Kylie Bennett 51 y.o. female is seen in the office  today for unknown primary found on needle core biopsy of a left neck node in August 2018, the biopsy revealed adenocarcinoma.  To date no definite primary site has been located including with PET scan, upper GI endoscopy colonoscopy breast exam and mammogram and breast ultrasound.  Material was sent for foundation 1 testing on 928 but there was not sufficient material to run the test.  The patient comes to the office today to discuss resection of left neck node. Recurrent situation came to light when she developed signs and symptoms of severe congestive heart failure with fluid retention shortness of breath and was admitted to [facility] for heart failure.  Further examination led to a needle biopsy of the left neck node.  She was also noted to have significant clots in her neck veins and pulmonary arteries, she has been on anticoagulation since.  The patient is a former smoker, she quit in June 2018.   Current Activity/ Functional Status:  Patient is independent with mobility/ambulation, transfers, ADL's, IADL's.   Zubrod Score: At the time of surgery this patient's most appropriate activity status/level should be described as: [x]     0    Normal activity, no symptoms []     1    Restricted in physical strenuous activity but ambulatory, able to do out light work []     2    Ambulatory and capable of self care, unable to do work activities, up and about               >50 % of waking hours                               []     3    Only limited self care, in bed greater than 50% of waking hours []     4    Completely disabled, no self care, confined to bed or chair []     5    Moribund   Past Medical History:  Diagnosis Date  . Cancer [employer])    metastatic carcinoma  . CHF (congestive heart failure) ([place])   . Diarrhea   . DVT (deep venous thrombosis) ([place])   . GERD (gastroesophageal reflux disease)   . Hypertension   . Pulmonary embolism ([place])   . Seasonal allergies     Past Surgical History:  Procedure Laterality Date  . COLONOSCOPY WITH PROPOFOL N/A 07/31/2017   Procedure: COLONOSCOPY WITH PROPOFOL;  Surgeon: Arta Silence, MD;  Location: [facility];  Service: Endoscopy;  Laterality: N/A;  . ESOPHAGOGASTRODUODENOSCOPY (EGD) WITH PROPOFOL N/A 07/31/2017   Procedure: ESOPHAGOGASTRODUODENOSCOPY (EGD) WITH PROPOFOL;  Surgeon: Arta Silence, MD;  Location: [facility];  Service: Endoscopy;  Laterality: N/A;  . IR US GUIDE BX ASP/DRAIN  05/30/2017  . RIGHT/LEFT HEART CATH AND CORONARY ANGIOGRAPHY N/A 05/27/2017   Procedure: RIGHT/LEFT HEART CATH AND CORONARY ANGIOGRAPHY;  Surgeon: Jolaine Artist, MD;  Location: [facility];  Service: Cardiovascular;  Laterality: N/A;  . WISDOM TOOTH EXTRACTION      Family History  Problem Relation Age of Onset  . COPD Mother   . Rheum arthritis Mother   . Breast cancer Mother   . Hypertension Father   . Skin cancer Father   . Heart attack Father        age 36s  . Heart attack Paternal Grandfather   . Stroke Maternal Grandfather     Social History   Socioeconomic History  . Marital status: Single    Spouse name: Not on file  . Number of children: Not on file  . Years of education: Not on file  . Highest education level: Not on file  Social Needs  . Financial resource strain: Not on file  . Food insecurity - worry: Not on file  . Food insecurity - inability: Not on file  . Transportation needs - medical: Not on file  . Transportation  needs - non-medical: Not on file  Occupational History  . Not on file  Tobacco Use  . Smoking status: Former Smoker    Packs/day: 1.00    Types: Cigarettes  . Smokeless tobacco: Never Used  . Tobacco comment: quit smoking June 2018  Substance and Sexual Activity  . Alcohol use: Yes    Comment: occasional  . Drug use: No  . Sexual activity: Not on file  Other Topics Concern  . Not on file  Social History Narrative  . Not on file    Social History   Tobacco Use  Smoking Status Former Smoker  . Packs/day: 1.00  . Types: Cigarettes  Smokeless Tobacco Never Used  Tobacco Comment   quit smoking June 2018    Social History   Substance and Sexual Activity  Alcohol Use Yes   Comment: occasional     Allergies  Allergen Reactions  . Unasyn [Ampicillin-Sulbactam Sodium] Other (See Comments)    Pt gets hot/flushed with infusion     Current Outpatient Medications  Medication Sig Dispense Refill  . acetaminophen (TYLENOL) 325 MG tablet Take 650 mg by mouth every 6 (six) hours as needed for mild pain.    Marland Kitchen apixaban (ELIQUIS) 5 MG TABS tablet Take 1 tablet (5 mg total) by mouth 2 (two) times daily. 180 tablet 3  . carvedilol (COREG) 3.125 MG tablet Take 1 tablet (3.125 mg total) by mouth 2 (two) times daily with a meal. 60 tablet 2  . digoxin (LANOXIN) 0.125 MG tablet Take 1 tablet (0.125 mg total) by mouth daily. 90 tablet 1  . fluticasone (FLONASE) 50 MCG/ACT nasal spray Place 2 sprays into both nostrils daily as needed for allergies.     . furosemide (LASIX) 20 MG tablet Take 1 tablet (20 mg total) by mouth daily. 90 tablet 1  . GuaiFENesin (MUCINEX PO) Take 1,500 mg by mouth daily.    . magnesium oxide (MAG-OX) 400 (241.3 Mg) MG tablet Take 1 tablet (400 mg total) by mouth 2 (two) times daily. 60 tablet 0  . omeprazole (PRILOSEC) 20 MG capsule Take 20 mg by mouth daily.    . sacubitril-valsartan (ENTRESTO) 49-51 MG Take 1 tablet by mouth 2 (two) times daily. 60 tablet 11  .  spironolactone (ALDACTONE) 25 MG tablet Take 1 tablet (25 mg total) by mouth  daily. 90 tablet 1   No current facility-administered medications for this visit.     Pertinent items are noted in HPI.   Review of Systems:     Cardiac Review of Systems: Y or N  Chest Pain [ n   ]  Resting SOB [ n  ] Exertional SOB  Blue.Reese  ]  Orthopnea [ n ]   Pedal Edema [  y ]    Palpitations [ n ] Syncope  [ n ]   Presyncope [ n  ]  General Review of Systems: [Y] = yes [  ]=no Constitional: recent weight change [  ];  Wt loss over the last 3 months [   ] anorexia [  ]; fatigue [ y ]; nausea [  ]; night sweats [  ]; fever [  ]; or chills [  ];          Dental: poor dentition[  ]; Last Dentist visit:   Eye : blurred vision [n  ]; diplopia [   ]; vision changes [  ];  Amaurosis fugax[  ]; Resp: cough [ n ];  wheezing[n ];  hemoptysis[n  ]; shortness of breath[y  ]; paroxysmal nocturnal dyspnea[y  ]; dyspnea on exertion[ y ]; or orthopnea[  ];  GI:  gallstones[y  ], vomiting[ n ];  dysphagia[ n]; melena[  n];  hematochezia [ n ]; heartburn[  ];   Hx of  Colonoscopy[y  ]; GU: kidney stones [  ]; hematuria[  ];   dysuria [  ];  nocturia[  ];  history of     obstruction [  ]; urinary frequency [  ]             Skin: rash, swelling[  ];, hair loss[  ];  peripheral edema[  ];  or itching[  ]; Musculosketetal: myalgias[  ];  joint swelling[  ];  joint erythema[  ];  joint pain[n  ];  back pain[  ];  Heme/Lymph: bruising[  ];  bleeding[  ];  anemia[  ];  Neuro: TIA[n  ];  headaches[  ];  stroke[  ];  vertigo[  ];  seizures[  ];   paresthesias[  ];  difficulty walking[  ];  Psych:depression[  ]; anxiety[  ];  Endocrine: diabetes[  ];  thyroid dysfunction[  ];  Immunizations: Flu up to date [  ]; Pneumococcal up to date [  ];  Other:  Physical Exam: BP 138/90 (BP Location: Right Arm, Patient Position: Sitting, Cuff Size: Large)   Pulse 88   Resp 16   Ht 5\' 7"  (1.702 m)   Wt 185 lb (83.9 kg)   SpO2 98% Comment: ON RA   BMI 28.98 kg/m   PHYSICAL EXAMINATION: General appearance: alert, cooperative, appears stated age and no distress Head: Normocephalic, without obvious abnormality, atraumatic Neck: no adenopathy, no carotid bruit, no JVD, supple, symmetrical, trachea midline and thyroid not enlarged, symmetric, no tenderness/mass/nodules Lymph nodes: Cervical, supraclavicular, and axillary nodes normal. and I cannot palpate in the left neck at the site of the previous needle biopsy, nor any easily biopsied supraclavicular or cervical lymph nodes on either side she has no axillary adenopathy Resp: clear to auscultation bilaterally Back: symmetric, no curvature. ROM normal. No CVA tenderness. Cardio: regular rate and rhythm, S1, S2 normal, no murmur, click, rub or gallop GI: soft, non-tender; bowel sounds normal; no masses,  no organomegaly Extremities: extremities normal, atraumatic, no cyanosis or edema and Homans sign is  negative, no sign of DVT Neurologic: Grossly normal  Diagnostic Studies & Laboratory data:     Recent Radiology Findings: Last PET scan and CT scan were in August and September 2018  CLINICAL DATA:  Initial Treatment strategy for metastatic adenocarcinoma of unknown primary.  EXAM: NUCLEAR MEDICINE PET SKULL BASE TO THIGH  TECHNIQUE: 12.25 mCi F-18 FDG was injected intravenously. Full-ring PET imaging was performed from the skull base to thigh after the radiotracer. CT data was obtained and used for attenuation correction and anatomic localization.  FASTING BLOOD GLUCOSE:  Value: 155 mg/dl  COMPARISON:  CT 05/28/2017  FINDINGS: NECK: There has been interval resolution of previous soft tissue stranding within the left supraclavicular region. Left supraclavicular node measures 8 mm and has an SUV max equal to 1.46.  CHEST: No hypermetabolic axillary lymph nodes. No hypermetabolic mediastinal or hilar lymph nodes. Bilateral lower lobe airspace consolidation is identified.  No hypermetabolic pulmonary nodules noted.  ABDOMEN/PELVIS: No abnormal hypermetabolic activity within the liver, pancreas, adrenal glands, or spleen. Gallstone noted. Aortic atherosclerosis identified. No hypermetabolic lymph nodes in the abdomen or pelvis.  SKELETON: No focal hypermetabolic activity to suggest skeletal metastasis.  IMPRESSION: 1. No findings identified to indicate site of primary neoplasm. 2. Subcentimeter left supraclavicular lymph node exhibits mild FDG uptake, nonspecific. 3. Bilateral lower lobe airspace consolidation may be the sequelae of aspiration and/or pneumonia.   Electronically Signed   By: Kerby Moors M.D.   On: 06/13/2017 10:01  I have independently reviewed the above radiologic studies.  Recent Lab Findings: Lab Results  Component Value Date   WBC 8.9 06/01/2017   HGB 11.9 (L) 06/01/2017   HCT 40.3 06/01/2017   PLT 636 (H) 06/01/2017   GLUCOSE 116 (H) 07/12/2017   ALT 17 05/28/2017   AST 33 05/28/2017   NA 135 07/12/2017   K 4.3 07/12/2017   CL 100 (L) 07/12/2017   CREATININE 0.90 07/12/2017   BUN 21 (H) 07/12/2017   CO2 26 07/12/2017   TSH 4.246 05/22/2017   INR 1.33 05/23/2017   HGBA1C 7.1 (H) 05/25/2017   CLINICAL DATA:  Initial Treatment strategy for metastatic adenocarcinoma of unknown primary.  EXAM: NUCLEAR MEDICINE PET SKULL BASE TO THIGH  TECHNIQUE: 12.25 mCi F-18 FDG was injected intravenously. Full-ring PET imaging was performed from the skull base to thigh after the radiotracer. CT data was obtained and used for attenuation correction and anatomic localization.  FASTING BLOOD GLUCOSE:  Value: 155 mg/dl  COMPARISON:  CT 05/28/2017  FINDINGS: NECK: There has been interval resolution of previous soft tissue stranding within the left supraclavicular region. Left supraclavicular node measures 8 mm and has an SUV max equal to 1.46.  CHEST: No hypermetabolic axillary lymph nodes. No  hypermetabolic mediastinal or hilar lymph nodes. Bilateral lower lobe airspace consolidation is identified. No hypermetabolic pulmonary nodules noted.  ABDOMEN/PELVIS: No abnormal hypermetabolic activity within the liver, pancreas, adrenal glands, or spleen. Gallstone noted. Aortic atherosclerosis identified. No hypermetabolic lymph nodes in the abdomen or pelvis.  SKELETON: No focal hypermetabolic activity to suggest skeletal metastasis.  IMPRESSION: 1. No findings identified to indicate site of primary neoplasm. 2. Subcentimeter left supraclavicular lymph node exhibits mild FDG uptake, nonspecific. 3. Bilateral lower lobe airspace consolidation may be the sequelae of aspiration and/or pneumonia.   Electronically Signed   By: Kerby Moors M.D.   On: 06/13/2017 10:01  I have independently reviewed the above radiology studies  and reviewed the findings with the patient.   Assessment /  Plan:   Patient with previous ultrasound directed needle biopsy of left supraclavicular lymph node positive for metastatic adenocarcinoma of unknown primary.  There is insufficient material to perform foundation 1 testing per my discussion with pathology today.  Tissue was sent on 9/28 but was insufficient. Reviewing the patient's physical exam and PET and CT scan I would not recommend proceeding with any surgical resection of lymph nodes as a form of treatment, the PET scan findings are very nonspecific and underwhelming that there is a isolated lymph node that could be resected.  If the goal is to obtain further diagnostic material for foundation or other testing I would recommend repeat ultrasound guided lymph node biopsy.   I  spent 30 minutes counseling the patient face to face and 50% or more the  time was spent in counseling and coordination of care. The total time spent in the appointment was 62minutes.  Grace Isaac MD      [address] [place]  [address] Office [phone]   Beeper [phone]  09/03/2017 5:09 PM

## 2017-09-04 ENCOUNTER — Other Ambulatory Visit: Payer: Self-pay | Admitting: Hematology and Oncology

## 2017-09-04 ENCOUNTER — Telehealth: Payer: Self-pay | Admitting: Hematology and Oncology

## 2017-09-04 DIAGNOSIS — C801 Malignant (primary) neoplasm, unspecified: Secondary | ICD-10-CM

## 2017-09-04 NOTE — Telephone Encounter (Signed)
I called the patient and discussed the recent clinic appointment with cardiothoracic surgery.  They did not recommend excision of the supraclavicular lymph node.  In order to get more tissue for foundation one analysis as breast cancer type ID, request another ultrasound-guided biopsy by radiology.  Patient is agreeable for this. I would like to see her after the biopsy is complete

## 2017-09-20 ENCOUNTER — Other Ambulatory Visit: Payer: Self-pay | Admitting: General Surgery

## 2017-09-20 ENCOUNTER — Other Ambulatory Visit: Payer: Self-pay | Admitting: Radiology

## 2017-09-23 ENCOUNTER — Ambulatory Visit (HOSPITAL_COMMUNITY)
Admission: RE | Admit: 2017-09-23 | Discharge: 2017-09-23 | Disposition: A | Discharge status: Home / Self Care | Payer: Medicaid Other | Source: Ambulatory Visit

## 2017-09-23 ENCOUNTER — Encounter (HOSPITAL_COMMUNITY): Payer: Self-pay

## 2017-09-23 ENCOUNTER — Ambulatory Visit (HOSPITAL_COMMUNITY)
Admission: RE | Admit: 2017-09-23 | Discharge: 2017-09-23 | Disposition: A | Discharge status: Home / Self Care | Payer: Medicaid Other | Source: Ambulatory Visit | Attending: Hematology and Oncology | Admitting: Hematology and Oncology

## 2017-09-23 DIAGNOSIS — Z79899 Other long term (current) drug therapy: Secondary | ICD-10-CM | POA: Diagnosis not present

## 2017-09-23 DIAGNOSIS — Z880 Allergy status to penicillin: Secondary | ICD-10-CM | POA: Insufficient documentation

## 2017-09-23 DIAGNOSIS — I509 Heart failure, unspecified: Secondary | ICD-10-CM | POA: Insufficient documentation

## 2017-09-23 DIAGNOSIS — Z7901 Long term (current) use of anticoagulants: Secondary | ICD-10-CM | POA: Insufficient documentation

## 2017-09-23 DIAGNOSIS — C801 Malignant (primary) neoplasm, unspecified: Secondary | ICD-10-CM | POA: Diagnosis not present

## 2017-09-23 DIAGNOSIS — R591 Generalized enlarged lymph nodes: Secondary | ICD-10-CM | POA: Diagnosis not present

## 2017-09-23 LAB — CBC
HEMATOCRIT: 42.8 % (ref 36.0–46.0)
HEMOGLOBIN: 14.5 g/dL (ref 12.0–15.0)
MCH: 32.7 pg (ref 26.0–34.0)
MCHC: 33.9 g/dL (ref 30.0–36.0)
MCV: 96.6 fL (ref 78.0–100.0)
Platelets: 280 10*3/uL (ref 150–400)
RBC: 4.43 MIL/uL (ref 3.87–5.11)
RDW: 13.7 % (ref 11.5–15.5)
WBC: 8.9 10*3/uL (ref 4.0–10.5)

## 2017-09-23 LAB — PROTIME-INR
INR: 0.96
PROTHROMBIN TIME: 12.7 s (ref 11.4–15.2)

## 2017-09-23 MED ORDER — LIDOCAINE HCL 1 % IJ SOLN
INTRAMUSCULAR | Status: AC
  Filled 2017-09-23: qty 20

## 2017-09-23 MED ORDER — SODIUM CHLORIDE 0.9 % IV SOLN: INTRAVENOUS | Status: DC

## 2017-09-23 NOTE — H&P (Signed)
Referring Physician(s): Dr. Nicholas Lose  Supervising Physician: Aletta Edouard  Patient Status: [facility] - Out-pt  Chief Complaint: Lymphadenopathy   Subjective: Patient incidentally found to have lymphadenopathy during her admission in August for CHF.  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  She has had multiple testing done since then with no success in determining a primary.  She presents today for repeat biopsy to send for foundation one testing.  Allergies: Unasyn [ampicillin-sulbactam sodium]  Medications: Prior to Admission medications   Medication Sig Start Date End Date Taking? Authorizing Provider  acetaminophen (TYLENOL) 325 MG tablet Take 650 mg by mouth every 6 (six) hours as needed for mild pain.   Yes [provider]  carvedilol (COREG) 3.125 MG tablet Take 1 tablet (3.125 mg total) by mouth 2 (two) times daily with a meal. 06/20/17  Yes Scot Jun, FNP  digoxin (LANOXIN) 0.125 MG tablet Take 1 tablet (0.125 mg total) by mouth daily. 06/20/17  Yes Scot Jun, FNP  fluticasone (FLONASE) 50 MCG/ACT nasal spray Place 2 sprays into both nostrils daily as needed for allergies.    Yes [provider]  furosemide (LASIX) 20 MG tablet Take 1 tablet (20 mg total) by mouth daily. 06/20/17  Yes Scot Jun, FNP  GuaiFENesin (MUCINEX PO) Take 1,500 mg by mouth daily.   Yes [provider]  magnesium oxide (MAG-OX) 400 (241.3 Mg) MG tablet Take 1 tablet (400 mg total) by mouth 2 (two) times daily. 06/01/17  Yes Patrecia Pour, MD  omeprazole (PRILOSEC) 20 MG capsule Take 20 mg by mouth daily.   Yes [provider]  sacubitril-valsartan (ENTRESTO) 49-51 MG Take 1 tablet by mouth 2 (two) times daily. 06/12/17  Yes Bensimhon, Shaune Pascal, MD  spironolactone (ALDACTONE) 25 MG tablet Take 1 tablet (25 mg total) by mouth daily. 06/20/17  Yes Scot Jun, FNP  apixaban (ELIQUIS) 5 MG TABS tablet Take 1 tablet (5  mg total) by mouth 2 (two) times daily. 06/12/17   Bensimhon, Shaune Pascal, MD    Vital Signs: BP (!) 136/99   Pulse 91   Temp 99.1 F (37.3 C) (Oral)   Resp 18   Ht 5\' 7"  (1.702 m)   Wt 180 lb (81.6 kg)   SpO2 100%   BMI 28.19 kg/m   Physical Exam: Gen: WD, WN WF in NAD Heart: regular Lungs: CTAB  Imaging: No results found.  Labs:  CBC: Recent Labs    05/29/17 0421 05/30/17 0445 05/31/17 0732 06/01/17 0440  WBC 15.0* 12.0* 13.5* 8.9  HGB 13.1 12.0 12.7 11.9*  HCT 42.5 40.6 41.2 40.3  PLT 629* 582* 590* 636*    COAGS: Recent Labs    05/23/17 0408 05/28/17 1855 05/29/17 0421 05/29/17 0704  INR 1.33  --   --   --   APTT  --  60* >200* 78*    BMP: Recent Labs    05/31/17 0732 06/01/17 0440 06/12/17 1059 07/12/17 1413  NA 135 136 136 135  K 4.3 4.7 4.5 4.3  CL 100* 101 104 100*  CO2 24 27 23 26   GLUCOSE 114* 114* 199* 116*  BUN 10 9 16  21*  CALCIUM 9.1 9.5 9.3 9.7  CREATININE 0.61 0.59 0.81 0.90  GFRNONAA >60 >60 >60 >60  GFRAA >60 >60 >60 >60    LIVER FUNCTION TESTS: Recent Labs    05/24/17 0457 05/25/17 0520 05/28/17 0509  BILITOT 1.8* 1.6* 1.0  AST  47* 40 33  ALT 22 22 17   ALKPHOS 75 76 67  PROT 5.9* 7.3 6.5  ALBUMIN 2.0* 2.3* 2.1*    Assessment and Plan: 1. 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  She would like to do this without sedation as she did well without it the first time. Risks and benefits discussed with the patient including, but not limited to bleeding, infection, damage to adjacent structures or low yield requiring additional tests. All of the patient's questions were answered, patient is agreeable to proceed. Consent signed and in chart.  Electronically Signed: Henreitta Cea 09/23/2017, 12:13 PM   I spent a total of 15 Minutes at the the patient's bedside AND on the patient's hospital floor or unit, greater  than 50% of which was counseling/coordinating care for lymphadenopathy

## 2017-09-23 NOTE — Procedures (Signed)
Interventional Radiology Procedure Note  Procedure: US guided core biopsy of left supraclavicular lymph node  Complications: None  Estimated Blood Loss: < 10 mL  Recommendations: 18 G core biopsy x 4 of 6 mm left supraclavicular lymph node.  Venetia Night. Kathlene Cote, M.D Pager:  [phone]

## 2017-10-23 ENCOUNTER — Other Ambulatory Visit: Payer: Self-pay | Admitting: Family Medicine

## 2017-10-31 ENCOUNTER — Ambulatory Visit (HOSPITAL_COMMUNITY): Payer: Medicaid Other

## 2017-10-31 ENCOUNTER — Encounter (HOSPITAL_COMMUNITY): Payer: Medicaid Other | Admitting: Internal Medicine

## 2017-11-16 ENCOUNTER — Other Ambulatory Visit: Payer: Self-pay | Admitting: Family Medicine

## 2017-12-05 ENCOUNTER — Other Ambulatory Visit: Payer: Self-pay | Admitting: Family Medicine

## 2017-12-06 ENCOUNTER — Encounter (HOSPITAL_COMMUNITY): Payer: Self-pay | Admitting: Internal Medicine

## 2017-12-06 ENCOUNTER — Ambulatory Visit (HOSPITAL_COMMUNITY)
Admission: RE | Admit: 2017-12-06 | Discharge: 2017-12-06 | Disposition: A | Discharge status: Home / Self Care | Payer: Medicaid Other | Source: Ambulatory Visit | Attending: Family Medicine | Admitting: Family Medicine

## 2017-12-06 ENCOUNTER — Ambulatory Visit (HOSPITAL_BASED_OUTPATIENT_CLINIC_OR_DEPARTMENT_OTHER)
Admission: RE | Admit: 2017-12-06 | Discharge: 2017-12-06 | Disposition: A | Discharge status: Home / Self Care | Payer: Medicaid Other | Source: Ambulatory Visit | Attending: Internal Medicine | Admitting: Internal Medicine

## 2017-12-06 VITALS — BP 130/82 | HR 98 | Wt 193.4 lb

## 2017-12-06 DIAGNOSIS — Z87891 Personal history of nicotine dependence: Secondary | ICD-10-CM | POA: Insufficient documentation

## 2017-12-06 DIAGNOSIS — Z7901 Long term (current) use of anticoagulants: Secondary | ICD-10-CM | POA: Diagnosis not present

## 2017-12-06 DIAGNOSIS — I2510 Atherosclerotic heart disease of native coronary artery without angina pectoris: Secondary | ICD-10-CM | POA: Diagnosis not present

## 2017-12-06 DIAGNOSIS — Z880 Allergy status to penicillin: Secondary | ICD-10-CM | POA: Diagnosis not present

## 2017-12-06 DIAGNOSIS — Z86711 Personal history of pulmonary embolism: Secondary | ICD-10-CM | POA: Insufficient documentation

## 2017-12-06 DIAGNOSIS — C779 Secondary and unspecified malignant neoplasm of lymph node, unspecified: Secondary | ICD-10-CM | POA: Insufficient documentation

## 2017-12-06 DIAGNOSIS — I428 Other cardiomyopathies: Secondary | ICD-10-CM | POA: Diagnosis not present

## 2017-12-06 DIAGNOSIS — Z79899 Other long term (current) drug therapy: Secondary | ICD-10-CM | POA: Insufficient documentation

## 2017-12-06 DIAGNOSIS — I5022 Chronic systolic (congestive) heart failure: Secondary | ICD-10-CM

## 2017-12-06 DIAGNOSIS — I110 Hypertensive heart disease with heart failure: Secondary | ICD-10-CM | POA: Insufficient documentation

## 2017-12-06 DIAGNOSIS — C801 Malignant (primary) neoplasm, unspecified: Secondary | ICD-10-CM

## 2017-12-06 DIAGNOSIS — K219 Gastro-esophageal reflux disease without esophagitis: Secondary | ICD-10-CM | POA: Diagnosis not present

## 2017-12-06 DIAGNOSIS — I2699 Other pulmonary embolism without acute cor pulmonale: Secondary | ICD-10-CM | POA: Diagnosis not present

## 2017-12-06 NOTE — Patient Instructions (Signed)
Your physician has requested that you have a cardiac MRI. Cardiac MRI uses a computer to create images of your heart as its beating, producing both still and moving pictures of your heart and major blood vessels. For further information please visit [email or web address] Please follow the instruction sheet given to you today for more information.  We will contact you in 6 months to schedule your next appointment.

## 2017-12-06 NOTE — Progress Notes (Signed)
Advanced Heart Failure Clinic Note   Primary Cardiologist: Dr. Meda Coffee Primary HF: Dr. Haroldine Laws   HPI:  Kylie Bennett is a 52 y.o. 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   Admitted 05/21/17 to 06/01/17 with SOB. Found to have bilateral PEs and right subclavian DVTs involving the head and neck. CT head/neck showed a mass of lymph nodes. US biopsy confirmed metastatic carcinoma but unable to locate primary.  Echo with EF 15-20%. She was diuresed 40 lbs.  PET scan 06/13/17 without site of primary neoplasm. Colonoscopy negative.   Underwent repeat LN biopsy 12/18; The core biopsies consist of benign lymph node tissue with attached fibroadipose tissue. Immunohistochemistry for cytokeratin AE1/AE3 and cytokeratin 7 are negative. At the request of Dr. Lindi Adie the previous left supraclavicular lymph node biopsy on 06/03/17 [phone]) is reviewed and shows metastatic carcinoma positive with cytokeratin AE1/AE3 and cytokeratin 7.  Returns today for f/u with her husband. Feels good but remains anxious about her cancer diagnosis. Has not heard back from [facility] yet. Denies SOB, edema, CP orthopnea or PND. Tolerating meds well.   Echo today reviewed personally with her EF 60-65% Mild LVH. RV ok. Personally reviewed   Studies:  Echo 05/22/2017  EF 15-20% with severe RV dysfunction. NICM  [facility] 05/27/17  Mid RCA lesion, 20 %stenosed.  Dist LAD lesion, 40 %stenosed. RHC hemodynamics AO = 123/89 (104) LV= 124/13 RA = 7 RV = 42/7 PA = 45/20 (29) PCW = 11 Fick cardiac output/index = 5.4/2.7 PVR = 3.3 WU SVR 1448 FA sat = 95% PA sat = 65%, 68%  CT chest 05/27/17 (Personally reviewed)  IMPRESSION: 1. Bilateral pulmonary emboli in the lower lobes and perhaps the right upper lobe. No heart strain identified. 2. Bibasilar pulmonary opacities. These opacities could represent pneumonia. Aspiration is  possible. Pulmonary infarcts cannot be excluded given the pulmonary emboli. Recommend follow-up to resolution. 3. Coronary artery calcifications. 4. Mildly enlarged nodes and increased attenuation in the fat of the left axilla. This is a nonspecific finding. Recommend clinical correlation and short-term follow-up to resolution. 5. Shotty nodes in the mediastinum may be reactive given the findings in the lungs. 6. Small pleural effusions. Small pericardial effusion.  Review of systems complete and found to be negative unless listed in HPI.    Past Medical History:  Diagnosis Date  . Cancer [facility])    metastatic carcinoma  . CHF (congestive heart failure) ([place])   . Diarrhea   . DVT (deep venous thrombosis) ([place])   . GERD (gastroesophageal reflux disease)   . Hypertension   . Pulmonary embolism ([place])   . Seasonal allergies     Current Outpatient Medications  Medication Sig Dispense Refill  . acetaminophen (TYLENOL) 325 MG tablet Take 650 mg by mouth every 6 (six) hours as needed for mild pain.    Marland Kitchen apixaban (ELIQUIS) 5 MG TABS tablet Take 1 tablet (5 mg total) by mouth 2 (two) times daily. 180 tablet 3  . carvedilol (COREG) 3.125 MG tablet TAKE 1 TABLET BY MOUTH TWICE DAILY WITH A MEAL AS DIRECTED 60 tablet 0  . digoxin (LANOXIN) 0.125 MG tablet Take 1 tablet (0.125 mg total) by mouth daily. 90 tablet 1  . fluticasone (FLONASE) 50 MCG/ACT nasal spray Place 2 sprays into both nostrils daily as needed for allergies.     . furosemide (LASIX) 20 MG tablet Take 1 tablet (20 mg total) by mouth  daily. 90 tablet 1  . GuaiFENesin (MUCINEX PO) Take 1,500 mg by mouth daily.    . magnesium oxide (MAG-OX) 400 (241.3 Mg) MG tablet Take 1 tablet (400 mg total) by mouth 2 (two) times daily. 60 tablet 0  . omeprazole (PRILOSEC) 20 MG capsule Take 20 mg by mouth daily.    . sacubitril-valsartan (ENTRESTO) 49-51 MG Take 1 tablet by mouth 2 (two) times daily. 60 tablet 11  . spironolactone (ALDACTONE)  25 MG tablet Take 1 tablet (25 mg total) by mouth daily. 90 tablet 1   No current facility-administered medications for this encounter.     Allergies  Allergen Reactions  . Unasyn [Ampicillin-Sulbactam Sodium] Other (See Comments)    Pt gets hot/flushed with infusion       Social History   Socioeconomic History  . Marital status: Single    Spouse name: Not on file  . Number of children: Not on file  . Years of education: Not on file  . Highest education level: Not on file  Social Needs  . Financial resource strain: Not on file  . Food insecurity - worry: Not on file  . Food insecurity - inability: Not on file  . Transportation needs - medical: Not on file  . Transportation needs - non-medical: Not on file  Occupational History  . Not on file  Tobacco Use  . Smoking status: Former Smoker    Packs/day: 1.00    Types: Cigarettes  . Smokeless tobacco: Never Used  . Tobacco comment: quit smoking June 2018  Substance and Sexual Activity  . Alcohol use: Yes    Comment: occasional  . Drug use: No  . Sexual activity: Not on file  Other Topics Concern  . Not on file  Social History Narrative  . Not on file      Family History  Problem Relation Age of Onset  . COPD Mother   . Rheum arthritis Mother   . Breast cancer Mother   . Hypertension Father   . Skin cancer Father   . Heart attack Father        age 82s  . Heart attack Paternal Grandfather   . Stroke Maternal Grandfather     Vitals:   12/06/17 1207  BP: 130/82  Pulse: 98  SpO2: 98%  Weight: 193 lb 6.4 oz (87.7 kg)   Wt Readings from Last 3 Encounters:  12/06/17 193 lb 6.4 oz (87.7 kg)  09/23/17 180 lb (81.6 kg)  09/03/17 185 lb (83.9 kg)    PHYSICAL EXAM: General:  Well appearing. No resp difficulty HEENT: normal Neck: supple. no JVD. Carotids 2+ bilat; no bruits. No lymphadenopathy or thryomegaly appreciated. Cor: PMI nondisplaced. Regular rate & rhythm. No rubs, gallops or murmurs. Lungs: clear.  Mildly decreased throughout  Abdomen: soft, nontender, nondistended. No hepatosplenomegaly. No bruits or masses. Good bowel sounds. Extremities: no cyanosis, clubbing, rash, edema Neuro: alert & orientedx3, cranial nerves grossly intact. moves all 4 extremities w/o difficulty. Affect pleasant    ASSESSMENT & PLAN:  1. Chronic systolic HF with biventricular dysfunction: Echo 05/22/17 with EF 15-20% with severe RV dysfunction. NICM. Cath 8/18 mild nonobstructive CAD - Echo reviewed personally. EF 60-65% mild LVH  - Volume stable on exam, continue lasix 20 mg daily. Can switch to prn if she wants.  - Continue Entresto 49/51 mg BID - Stop digoxin  - Continue Spiro 25 mg daily - Continue Coreg 3.125 mg BID - Proceed with cMRI to exclude infiltrative process with  LVH  2. RIJ/sublcavianDVT with bilateral PE  - VQ 05/23/17 with bilateral perfusion defects. - CT chest 05/27/17 with bilateral PE but no clot in main PAs. No RV strain - Continues on Eliquis. Can likely switch apixiban to 2.5 bid at next visit based on Amplify-EXT trial   3. Tobacco use -  Continued abstinence.   4. Lymph node biopsy suggestive of metastatic carcinoma.  - IR saw during recent admission. S/P lymph node biopsy 05/30/17. Pathology concerning for metastatic carcinoma.  - No evidence of Breast CA on Mammogram or [place] 06/06/17. - CEA mildly positive and CA-125 positive. - PET scan without site of primary neoplasm. - Colonoscopy negative - Repeat LN biopsy negative - I have contacted Dr. Lindi Adie to arrange f/u in [place], [place] 12/06/17

## 2017-12-06 NOTE — Progress Notes (Signed)
  Echocardiogram 2D Echocardiogram has been performed.  Darlina Sicilian M 12/06/2017, 11:57 AM

## 2017-12-10 ENCOUNTER — Telehealth: Payer: Self-pay

## 2017-12-11 NOTE — Telephone Encounter (Signed)
Left vm that patient needs appointment as last office visit was 8/28/218. Last refill did state no more refills until office visit

## 2017-12-16 ENCOUNTER — Ambulatory Visit (INDEPENDENT_AMBULATORY_CARE_PROVIDER_SITE_OTHER): Payer: Medicaid Other | Admitting: Family Medicine

## 2017-12-16 ENCOUNTER — Encounter: Payer: Self-pay | Admitting: Family Medicine

## 2017-12-16 VITALS — BP 130/80 | HR 80 | Temp 98.0°F | Ht 67.0 in | Wt 193.6 lb

## 2017-12-16 DIAGNOSIS — C799 Secondary malignant neoplasm of unspecified site: Secondary | ICD-10-CM | POA: Diagnosis not present

## 2017-12-16 DIAGNOSIS — I10 Essential (primary) hypertension: Secondary | ICD-10-CM

## 2017-12-16 DIAGNOSIS — I5022 Chronic systolic (congestive) heart failure: Secondary | ICD-10-CM

## 2017-12-16 DIAGNOSIS — E119 Type 2 diabetes mellitus without complications: Secondary | ICD-10-CM | POA: Diagnosis not present

## 2017-12-16 LAB — POCT URINALYSIS DIP (DEVICE)
Bilirubin Urine: NEGATIVE
Glucose, UA: NEGATIVE mg/dL
Ketones, ur: NEGATIVE mg/dL
Nitrite: NEGATIVE
PROTEIN: NEGATIVE mg/dL
SPECIFIC GRAVITY, URINE: 1.025 (ref 1.005–1.030)
UROBILINOGEN UA: 0.2 mg/dL (ref 0.0–1.0)
pH: 5 (ref 5.0–8.0)

## 2017-12-16 LAB — POCT GLYCOSYLATED HEMOGLOBIN (HGB A1C): Hemoglobin A1C: 6.9

## 2017-12-16 MED ORDER — CARVEDILOL 3.125 MG PO TABS: ORAL_TABLET | ORAL | 2 refills | Status: DC

## 2017-12-16 MED ORDER — FUROSEMIDE 20 MG PO TABS: 20.0000 mg | ORAL_TABLET | Freq: Every day | ORAL | 3 refills | Status: DC

## 2017-12-16 MED ORDER — SPIRONOLACTONE 25 MG PO TABS: 25.0000 mg | ORAL_TABLET | Freq: Every day | ORAL | 2 refills | Status: DC

## 2017-12-16 NOTE — Patient Instructions (Signed)
Physical Activity With Heart Disease Exercising has many benefits, even when you have heart disease. It can help control cholesterol and high blood pressure and improve sleep. It can make your bones and heart muscle stronger. If you exercise about 20-30 minutes per day, 5 times per week, you will be able to do more and feel healthier. Before starting an exercise program, talk with your health care provider about exercising. Your health care provider may recommend a physical exam before starting an exercise program. Your health care provider can match your fitness level with the right exercise program. How can being active help improve my health? When exercise is done on a regular basis, there are many benefits. Exercise can:  Lower your blood pressure.  Lower your cholesterol.  Control your weight.  Improve your sleep.  Help control your blood sugars.  Improve your heart and lung function.  Strengthen your bones and reduce bone loss.  Reduce your risk of blood clots (thrombophlebitis).  Lower your risk of certain cancers.  Improve your energy level.  Reduce stress.  Make you feel better about yourself.  How do I begin a heart-healthy exercise program?  Talk with your health care provider about how often to exercise and what types of exercise would be good for you. Ask your health care provider if: ? You should engage in moderate or vigorous cardiovascular exercise. ? There are any exercises you should avoid. ? Your medicines should be taken at a certain time. ? You should check your pulse or take other precautions during exercise.  Get a calendar. Write down a schedule and plan for your exercise routine.  Consider joining a community exercise program, such as a biking group, yoga class, or swimming pool membership. You can also join a local gym. You can find free workout applications on some phones or devices, or purchase workout DVDs and exercise on your own. Find out what  works for you.  After checking with your health care provider about approved activities, try to incorporate a variety of activities into your plan. For instance, you may do a yoga class Tuesdays and Thursdays, walk or climb stairs Mondays and Wednesdays, and then lift small weights while you are watching your favorite show on Sunday night.  If you have not been exercising, begin with sessions that last 10 to 15 minutes. Gradually work up to sessions that last 20 to 30 minutes. Try to exercise 5 times per week. Follow all of your health care provider's recommendations.  Be patient with yourself. It takes time to build up strength and lung capacity. What are some types of exercises I could try? Cardiovascular exercise gets your heart and lungs working. Depending on your speed and intensity, a cardiovascular activity can be moderate or can become vigorous. Watch your pace and follow your health care provider's recommendations about the intensity of your workouts. Moderate cardiovascular exercise includes:  Walking.  Slow bicycling.  Water aerobics.  Doubles tennis.  Ballroom dancing.  Light gardening or yard work.  Vigorous cardiovascular exercise includes:  Jogging or running.  Jumping rope.  Singles tennis.  Stair climbing.  Swimming laps.  Cross-country skiing.  Hiking uphill.  Heavy gardening, such as digging trenches.  Strengthening exercises tense your muscles to build strength. Some examples include:  Doing push-ups and abdominal work.  Lifting small weights.  Using resistance bands.  Doing exercises with a medicine ball.  Doing pull-ups on a pull-up bar.  Flexibility exercises lengthen your muscles to keep them flexible   and less tight and improve balance. Some examples include:  Stretching.  Yoga.  Tai chi.  Ballet barre.  What other things will help me be successful?  If you do not enjoy an activity, try a new one. Keep doing new things until you  find exercises that you like.  Drink plenty of water before, during, and after exercise.  Eat meals about 2 hours before you exercise. If you need to snack right before, eat fruit such as a banana or apple.  Wear clothing that is comfortable, fits well, and is appropriate for the weather. Also be sure to wear supportive shoes.  Warm up or stretch for 5 minutes before exercise. Slowly decrease your activity or cool down for 5 minutes after exercise. This can help prevent injury. Ask your health care provider for more information.  Always exercise within your abilities and be aware of what your body is able to handle.  If you belong to a gym, ask the staff to help you learn how to use equipment and exercise machines. Ask a physical therapist or trainer about proper form and techniques to prevent injury. Are there any precautions I might need to take?  Depending on your condition, you may need to work closely with your health care provider or specialist to come up with an approved exercise program. Follow your health care provider's instructions for recovery, cardiac rehabilitation, and a long-term exercise plan.  Because of your heart condition, you are more sensitive to weather and environmental changes and need to be cautious. If there are extreme outdoor conditions, such as heat, humidity, or cold, you may need to exercise indoors. If chemicals or other pollutants in the air reach an unsafe level and there is an air pollution advisory, you may need to exercise indoors. Your local news, board of health, or hospital can provide information on air quality. Exercise in an indoor, climate-controlled facility if these conditions exist.  Monitor your pulse if directed by your health care provider.  If you feel tired, or have any heart symptoms, such as shortness of breath, dizziness, irregular heartbeat, or chest pains, stop exercising and call your health care provider or 911.  If you have certain  types of heart disease, you may need to take extra precautions. These may include: ? Avoiding heavy lifting. ? Avoiding strenuous activities. ? Making sure you include a cool-down period to give your body time to adjust. ? Understanding how your medicines can affect you during exercise. Certain medicines may cause heat intolerance and changes in blood sugar. ? Knowing your regular symptoms. Slow down and rest when you need to. Call your health care provider if they happen more, last longer, or feel stronger. ? Keeping nitroglycerin spray and tabletswith you at all times if you have angina. Use them as directed to prevent and treat symptoms. When should I see my health care provider?  You have chest pain, shortness of breath, or feel very tired.  You have pain in the arm, shoulder, neck, or jaw.  You feel weak, dizzy, or light-headed.  You have an irregular heart rate or your heart rate is greater than 100 beats per minute (bpm) before exercise. This information is not intended to replace advice given to you by your health care provider. Make sure you discuss any questions you have with your health care provider. Document Released: 04/21/2014 Document Revised: 02/27/2016 Document Reviewed: 01/26/2014 Elsevier Interactive Patient Education  2018 [employer]  

## 2017-12-16 NOTE — Progress Notes (Signed)
Patient ID: Kylie Bennett, female    DOB: [birth year], 52 y.o.   MRN: [id number]  PCP: Scot Jun, FNP  Chief Complaint  Patient presents with  . Annual Exam  . Medication Refill    coreg,digoxin,spironolactone    Subjective:  HPI Kylie Bennett is a 52 y.o. female with CHF, metastatic cancer of unknown origin, presents for chronic condition follow-up and medication refills. Remedios reports routine follow-up with cardiology since her last visit here in office.  She reports she was told that her heart functioning had improved.  Per notes from prior cardiology visit current EF is normal at 60 to 65% compared to previous EF of 15 to 20% during hospitalization in August 2018 she was found to have bilateral PEs, a mass of the lymph nodes, which was confirmed to be metastatic cancer.  Today she reports that she underwent a mammogram which was normal and a Pap smear which was abnormal however on repeat Pap cells were normal.  She had a biopsy of the lymph node of her neck which initially showed cancer returned as noncancerous.  She reports doing very well overall.  She reports compliance with medication regimen.  She denies any shortness of breath, dizziness, bilateral lower extremity edema, chronic cough, or bleeding.  She reports being able to engage in routine activity without tiring.  She is currently on anticoagulation for prior PEs and DVTs and denies any instances of abnormal bleeding.  She has no other complaints today.  Requests refills on all medications. Social History   Socioeconomic History  . Marital status: Single    Spouse name: Not on file  . Number of children: Not on file  . Years of education: Not on file  . Highest education level: Not on file  Social Needs  . Financial resource strain: Not on file  . Food insecurity - worry: Not on file  . Food insecurity - inability: Not on file  . Transportation needs - medical: Not on file  . Transportation needs - non-medical: Not on  file  Occupational History  . Not on file  Tobacco Use  . Smoking status: Former Smoker    Packs/day: 1.00    Types: Cigarettes  . Smokeless tobacco: Never Used  . Tobacco comment: quit smoking June 2018  Substance and Sexual Activity  . Alcohol use: Yes    Comment: occasional  . Drug use: No  . Sexual activity: Not on file  Other Topics Concern  . Not on file  Social History Narrative  . Not on file    Family History  Problem Relation Age of Onset  . COPD Mother   . Rheum arthritis Mother   . Breast cancer Mother   . Hypertension Father   . Skin cancer Father   . Heart attack Father        age 83s  . Heart attack Paternal Grandfather   . Stroke Maternal Grandfather    Review of Systems Pertinent negatives indicated in HPI Patient Active Problem List   Diagnosis Date Noted  . Chronic systolic CHF (congestive heart failure) ([place]) 06/12/2017  . Carcinoma of unknown primary ([place]) 06/07/2017  . Lymph node enlargement   . Bilateral pulmonary embolism ([place])   . Axillary adenopathy   . Hoarseness of voice   . Nonischemic cardiomyopathy ([place])   . Hyponatremia 05/22/2017  . Community acquired pneumonia of right lower lobe of lung ([place]) 05/22/2017    Allergies  Allergen Reactions  . Unasyn [Ampicillin-Sulbactam Sodium]  Other (See Comments)    Pt gets hot/flushed with infusion     Prior to Admission medications   Medication Sig Start Date End Date Taking? Authorizing Provider  acetaminophen (TYLENOL) 325 MG tablet Take 650 mg by mouth every 6 (six) hours as needed for mild pain.   Yes [provider]  apixaban (ELIQUIS) 5 MG TABS tablet Take 1 tablet (5 mg total) by mouth 2 (two) times daily. 06/12/17  Yes Bensimhon, Shaune Pascal, MD  carvedilol (COREG) 3.125 MG tablet TAKE 1 TABLET BY MOUTH TWICE DAILY WITH A MEAL AS DIRECTED 12/06/17  Yes Scot Jun, FNP  digoxin (LANOXIN) 0.125 MG tablet Take 1 tablet (0.125 mg total) by mouth daily. 06/20/17  Yes Scot Jun, FNP  fluticasone (FLONASE) 50 MCG/ACT nasal spray Place 2 sprays into both nostrils daily as needed for allergies.    Yes [provider]  furosemide (LASIX) 20 MG tablet TAKE 1 TABLET BY MOUTH DAILY 12/06/17  Yes Scot Jun, FNP  GuaiFENesin (MUCINEX PO) Take 1,500 mg by mouth daily.   Yes [provider]  magnesium oxide (MAG-OX) 400 (241.3 Mg) MG tablet Take 1 tablet (400 mg total) by mouth 2 (two) times daily. 06/01/17  Yes Patrecia Pour, MD  omeprazole (PRILOSEC) 20 MG capsule Take 20 mg by mouth daily.   Yes [provider]  sacubitril-valsartan (ENTRESTO) 49-51 MG Take 1 tablet by mouth 2 (two) times daily. 06/12/17  Yes Bensimhon, Shaune Pascal, MD  spironolactone (ALDACTONE) 25 MG tablet TAKE 1 TABLET BY MOUTH DAILY 12/06/17  Yes Scot Jun, FNP    Past Medical, Surgical Family and Social History reviewed and updated.    Objective:   Today's Vitals   12/16/17 1448  BP: 130/80  Pulse: 80  Temp: 98 F (36.7 C)  TempSrc: Oral  SpO2: 100%  Weight: 193 lb 9.6 oz (87.8 kg)  Height: 5\' 7"  (1.702 m)    Wt Readings from Last 3 Encounters:  12/16/17 193 lb 9.6 oz (87.8 kg)  12/06/17 193 lb 6.4 oz (87.7 kg)  09/23/17 180 lb (81.6 kg)    Physical Exam Constitutional: Patient appears well-developed and well-nourished. No distress. HENT: Normocephalic, atraumatic, External right and left ear normal. Oropharynx is clear and moist.  Eyes: Conjunctivae and EOM are normal. PERRLA, no scleral icterus. Neck: Normal ROM. Neck supple. No JVD. No tracheal deviation. No thyromegaly. CVS: RRR, S1/S2 +, no murmurs, no gallops, no carotid bruit.  Pulmonary: Effort and breath sounds normal, no stridor, rhonchi, wheezes, rales.  Abdominal: Soft. BS +, no distension, tenderness, rebound or guarding.  Musculoskeletal: Normal range of motion. No edema and no tenderness.  Lymphadenopathy: No lymphadenopathy noted, cervical. Neuro: Alert. Normal reflexes,  muscle tone coordination.  Skin: Skin is warm and dry. No rash noted. Not diaphoretic. No erythema. No pallor. Psychiatric: Normal mood and affect. Behavior, judgment, thought content normal. Assessment & Plan:  1. Chronic systolic CHF (congestive heart failure) ([place]), asymptomatic of symptoms of heart failure today.  Patient has managed by cardiology. Refilled spirolactone and coreg. Per last cardiology note, digoxin was discontinued. Will not restart digitoxin.   2. Metastatic carcinoma ([place]), continue routine follow-up with oncology.  If any new nodules or enlargement of lymph nodes appear return for follow-up immediately.  3. Hypertension, Essential , - Continue current BP medications We have discussed target BP range and blood pressure goal. I have advised patient to check BP regularly and to call us back or report  to clinic if the numbers are consistently higher than 140/90. We discussed the importance of compliance with medical therapy and DASH diet recommended, consequences of uncontrolled hypertension discussed.    4. Type 2 Diabetes, A1C 6.9, new diagnosis. Encouraged routine physical activity. Will trial metformin 500 mg twice daily with meals. Return in 6 months for repeat A1C.   Carroll Sage. Kenton Kingfisher, MSN, FNP-C [facility]  [address] [place], [place] [address] [phone]

## 2017-12-23 ENCOUNTER — Telehealth: Payer: Self-pay | Admitting: Family Medicine

## 2017-12-23 NOTE — Telephone Encounter (Signed)
Left message for patient to return call regarding A1C and would like to start metformin.

## 2017-12-23 NOTE — Telephone Encounter (Signed)
-----   Message from Scot Jun, [place] sent at 12/22/2017  5:38 PM EDT ----- Call patient regarding A1C

## 2017-12-27 ENCOUNTER — Telehealth: Payer: Self-pay | Admitting: Internal Medicine

## 2017-12-27 NOTE — Telephone Encounter (Signed)
Called patient and left VM to call me back to let me know what time of day she wants her MRI scheduled.

## 2018-01-28 ENCOUNTER — Telehealth: Payer: Self-pay | Admitting: Internal Medicine

## 2018-01-28 NOTE — Telephone Encounter (Signed)
Called patient and LVM to return my call with what time she would like her cardiac MRI.

## 2018-02-14 ENCOUNTER — Encounter (HOSPITAL_COMMUNITY): Payer: Self-pay | Admitting: *Deleted

## 2018-03-18 ENCOUNTER — Telehealth (HOSPITAL_COMMUNITY): Payer: Self-pay | Admitting: Cardiology

## 2018-03-18 MED ORDER — APIXABAN 5 MG PO TABS: 5.0000 mg | ORAL_TABLET | Freq: Two times a day (BID) | ORAL | 3 refills | Status: DC

## 2018-03-18 NOTE — Telephone Encounter (Signed)
Order printed to attach with PAP application

## 2018-05-04 ENCOUNTER — Other Ambulatory Visit: Payer: Self-pay | Admitting: Family Medicine

## 2018-05-16 ENCOUNTER — Telehealth (HOSPITAL_COMMUNITY): Payer: Self-pay | Admitting: Pharmacist

## 2018-05-16 ENCOUNTER — Other Ambulatory Visit (HOSPITAL_COMMUNITY): Payer: Self-pay | Admitting: Pharmacist

## 2018-05-16 MED ORDER — APIXABAN 5 MG PO TABS: 5.0000 mg | ORAL_TABLET | Freq: Two times a day (BID) | ORAL | 11 refills | Status: DC

## 2018-05-16 MED ORDER — SACUBITRIL-VALSARTAN 49-51 MG PO TABS
1.0000 | ORAL_TABLET | Freq: Two times a day (BID) | ORAL | 11 refills | Status: DC
Start: 2018-05-16 — End: 2018-07-10

## 2018-05-16 NOTE — Telephone Encounter (Signed)
Ms. Bohne sent patient assistance applications for Entresto and Eliquis. After speaking with her, she does have active Medicaid coverage for her medications so she would be ineligible for patient assistance. Eliquis does not require a PA and should be $3 at the pharmacy. I have submitted the PA for her Delene Loll which should be approved within 24 hours and will also be a $3 copay. Patient aware and grateful for the information.   Ruta Hinds. Velva Harman, PharmD, BCPS, CPP Clinical Pharmacist Phone: [phone] 05/16/2018 3:25 PM

## 2018-05-22 ENCOUNTER — Telehealth (HOSPITAL_COMMUNITY): Payer: Self-pay | Admitting: Pharmacist

## 2018-05-22 NOTE — Telephone Encounter (Signed)
Entresto PA approved by  Medicaid through 05/11/19.   Ruta Hinds. Velva Harman, PharmD, BCPS, CPP Clinical Pharmacist Phone: [phone] 05/22/2018 10:06 AM

## 2018-05-29 ENCOUNTER — Encounter (HOSPITAL_COMMUNITY): Payer: Medicaid Other

## 2018-06-18 ENCOUNTER — Telehealth (HOSPITAL_COMMUNITY): Payer: Self-pay

## 2018-06-18 NOTE — Telephone Encounter (Signed)
Patient calling CHF clinic triage to schedule 6 mo appt with bensimhon with echocardiogram. Scheduled for patient. Also wanting to know if our office/providers will be included in a new insurance she is switching to. Will forward to [place] (CHF clinical social workers) to see if they can assist in determining this.  Renee Pain, RN

## 2018-06-23 ENCOUNTER — Ambulatory Visit: Payer: Medicaid Other | Admitting: Family Medicine

## 2018-06-24 ENCOUNTER — Telehealth: Payer: Self-pay | Admitting: Licensed Clinical Social Worker

## 2018-06-24 NOTE — Telephone Encounter (Signed)
CSW returned call to patient to follow up on insurance questions. Awaiting return call. Raquel Sarna, [place], [place]

## 2018-07-07 ENCOUNTER — Other Ambulatory Visit (HOSPITAL_COMMUNITY): Payer: Self-pay | Admitting: *Deleted

## 2018-07-07 DIAGNOSIS — I5022 Chronic systolic (congestive) heart failure: Secondary | ICD-10-CM

## 2018-07-09 ENCOUNTER — Encounter (HOSPITAL_COMMUNITY): Payer: Medicaid Other | Admitting: Internal Medicine

## 2018-07-09 ENCOUNTER — Other Ambulatory Visit (HOSPITAL_COMMUNITY): Payer: Medicaid Other

## 2018-07-10 ENCOUNTER — Ambulatory Visit (HOSPITAL_BASED_OUTPATIENT_CLINIC_OR_DEPARTMENT_OTHER)
Admission: RE | Admit: 2018-07-10 | Discharge: 2018-07-10 | Disposition: A | Discharge status: Home / Self Care | Payer: Medicaid Other | Source: Ambulatory Visit | Attending: Internal Medicine | Admitting: Internal Medicine

## 2018-07-10 ENCOUNTER — Ambulatory Visit (HOSPITAL_COMMUNITY)
Admission: RE | Admit: 2018-07-10 | Discharge: 2018-07-10 | Disposition: A | Discharge status: Home / Self Care | Payer: Medicaid Other | Source: Ambulatory Visit | Attending: Internal Medicine | Admitting: Internal Medicine

## 2018-07-10 VITALS — BP 144/96 | HR 89 | Wt 198.6 lb

## 2018-07-10 DIAGNOSIS — I5022 Chronic systolic (congestive) heart failure: Secondary | ICD-10-CM

## 2018-07-10 DIAGNOSIS — Z86711 Personal history of pulmonary embolism: Secondary | ICD-10-CM | POA: Diagnosis not present

## 2018-07-10 DIAGNOSIS — K219 Gastro-esophageal reflux disease without esophagitis: Secondary | ICD-10-CM | POA: Diagnosis not present

## 2018-07-10 DIAGNOSIS — Z880 Allergy status to penicillin: Secondary | ICD-10-CM | POA: Insufficient documentation

## 2018-07-10 DIAGNOSIS — Z86718 Personal history of other venous thrombosis and embolism: Secondary | ICD-10-CM | POA: Insufficient documentation

## 2018-07-10 DIAGNOSIS — I2699 Other pulmonary embolism without acute cor pulmonale: Secondary | ICD-10-CM

## 2018-07-10 DIAGNOSIS — Z79899 Other long term (current) drug therapy: Secondary | ICD-10-CM | POA: Insufficient documentation

## 2018-07-10 DIAGNOSIS — I10 Essential (primary) hypertension: Secondary | ICD-10-CM

## 2018-07-10 DIAGNOSIS — Z8249 Family history of ischemic heart disease and other diseases of the circulatory system: Secondary | ICD-10-CM | POA: Diagnosis not present

## 2018-07-10 DIAGNOSIS — Z7901 Long term (current) use of anticoagulants: Secondary | ICD-10-CM | POA: Insufficient documentation

## 2018-07-10 DIAGNOSIS — I428 Other cardiomyopathies: Secondary | ICD-10-CM | POA: Insufficient documentation

## 2018-07-10 DIAGNOSIS — Z87891 Personal history of nicotine dependence: Secondary | ICD-10-CM | POA: Diagnosis not present

## 2018-07-10 DIAGNOSIS — F17210 Nicotine dependence, cigarettes, uncomplicated: Secondary | ICD-10-CM | POA: Diagnosis not present

## 2018-07-10 DIAGNOSIS — I110 Hypertensive heart disease with heart failure: Secondary | ICD-10-CM | POA: Diagnosis not present

## 2018-07-10 MED ORDER — SACUBITRIL-VALSARTAN 97-103 MG PO TABS: 1.0000 | ORAL_TABLET | Freq: Two times a day (BID) | ORAL | 6 refills | Status: DC

## 2018-07-10 NOTE — Progress Notes (Signed)
Advanced Heart Failure Clinic Note   Primary Cardiologist: Dr. Meda Coffee Primary HF: Dr. Haroldine Laws   HPI:  Kylie Bennett is a 52 y.o. 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   Admitted 05/21/17 to 06/01/17 with SOB. Found to have bilateral PEs and right subclavian DVTs involving the head and neck. CT head/neck showed a mass of lymph nodes. US biopsy confirmed metastatic carcinoma but unable to locate primary.  Echo with EF 15-20%. She was diuresed 40 lbs.  PET scan 06/13/17 without site of primary neoplasm. Colonoscopy negative.   Underwent repeat LN biopsy 12/18; The core biopsies consist of benign lymph node tissue with attached fibroadipose tissue. Immunohistochemistry for cytokeratin AE1/AE3 and cytokeratin 7 are negative. At the request of Dr. Lindi Adie the previous left supraclavicular lymph node biopsy on 06/03/17 [phone]) is reviewed and shows metastatic carcinoma positive with cytokeratin AE1/AE3 and cytokeratin 7.   Returns today for f/u with her husband. Feels good. Denies SOB, edema or CP. Remains on low-dose Eliquis without bleeding. SBP typically 130-140.   Echo today reviewed personally with her EF 60-65% RV mildly dilated but normal function and no evidence of PAH. Personally reviewed   Studies:  Echo 05/22/2017  EF 15-20% with severe RV dysfunction. NICM  [facility] 05/27/17  Mid RCA lesion, 20 %stenosed.  Dist LAD lesion, 40 %stenosed. RHC hemodynamics AO = 123/89 (104) LV= 124/13 RA = 7 RV = 42/7 PA = 45/20 (29) PCW = 11 Fick cardiac output/index = 5.4/2.7 PVR = 3.3 WU SVR 1448 FA sat = 95% PA sat = 65%, 68%  CT chest 05/27/17 (Personally reviewed)  IMPRESSION: 1. Bilateral pulmonary emboli in the lower lobes and perhaps the right upper lobe. No heart strain identified. 2. Bibasilar pulmonary opacities. These opacities could represent pneumonia. Aspiration is possible.  Pulmonary infarcts cannot be excluded given the pulmonary emboli. Recommend follow-up to resolution. 3. Coronary artery calcifications. 4. Mildly enlarged nodes and increased attenuation in the fat of the left axilla. This is a nonspecific finding. Recommend clinical correlation and short-term follow-up to resolution. 5. Shotty nodes in the mediastinum may be reactive given the findings in the lungs. 6. Small pleural effusions. Small pericardial effusion.  Review of systems complete and found to be negative unless listed in HPI.    Past Medical History:  Diagnosis Date  . Cancer [facility])    metastatic carcinoma  . CHF (congestive heart failure) ([place])   . Diarrhea   . DVT (deep venous thrombosis) ([place])   . GERD (gastroesophageal reflux disease)   . Hypertension   . Pulmonary embolism ([place])   . Seasonal allergies     Current Outpatient Medications  Medication Sig Dispense Refill  . acetaminophen (TYLENOL) 325 MG tablet Take 650 mg by mouth every 6 (six) hours as needed for mild pain.    Marland Kitchen apixaban (ELIQUIS) 5 MG TABS tablet Take 1 tablet (5 mg total) by mouth 2 (two) times daily. 60 tablet 11  . carvedilol (COREG) 3.125 MG tablet TAKE 1 TABLET BY MOUTH TWICE DAILY WITH A MEAL AS DIRECTED 60 tablet 0  . fluticasone (FLONASE) 50 MCG/ACT nasal spray Place 2 sprays into both nostrils daily as needed for allergies.     . furosemide (LASIX) 20 MG tablet Take 1 tablet (20 mg total) by mouth daily. 90 tablet 3  . GuaiFENesin (MUCINEX PO) Take 1,500 mg by mouth daily.    . magnesium oxide (MAG-OX)  400 (241.3 Mg) MG tablet Take 1 tablet (400 mg total) by mouth 2 (two) times daily. 60 tablet 0  . omeprazole (PRILOSEC) 20 MG capsule Take 20 mg by mouth daily.    . sacubitril-valsartan (ENTRESTO) 49-51 MG Take 1 tablet by mouth 2 (two) times daily. 60 tablet 11  . spironolactone (ALDACTONE) 25 MG tablet Take 1 tablet (25 mg total) by mouth daily. 90 tablet 2   No current facility-administered  medications for this encounter.     Allergies  Allergen Reactions  . Unasyn [Ampicillin-Sulbactam Sodium] Other (See Comments)    Pt gets hot/flushed with infusion       Social History   Socioeconomic History  . Marital status: Single    Spouse name: Not on file  . Number of children: Not on file  . Years of education: Not on file  . Highest education level: Not on file  Occupational History  . Not on file  Social Needs  . Financial resource strain: Not on file  . Food insecurity:    Worry: Not on file    Inability: Not on file  . Transportation needs:    Medical: Not on file    Non-medical: Not on file  Tobacco Use  . Smoking status: Former Smoker    Packs/day: 1.00    Types: Cigarettes  . Smokeless tobacco: Never Used  . Tobacco comment: quit smoking June 2018  Substance and Sexual Activity  . Alcohol use: Yes    Comment: occasional  . Drug use: No  . Sexual activity: Not on file  Lifestyle  . Physical activity:    Days per week: Not on file    Minutes per session: Not on file  . Stress: Not on file  Relationships  . Social connections:    Talks on phone: Not on file    Gets together: Not on file    Attends religious service: Not on file    Active member of club or organization: Not on file    Attends meetings of clubs or organizations: Not on file    Relationship status: Not on file  . Intimate partner violence:    Fear of current or ex partner: Not on file    Emotionally abused: Not on file    Physically abused: Not on file    Forced sexual activity: Not on file  Other Topics Concern  . Not on file  Social History Narrative  . Not on file      Family History  Problem Relation Age of Onset  . COPD Mother   . Rheum arthritis Mother   . Breast cancer Mother   . Hypertension Father   . Skin cancer Father   . Heart attack Father        age 24s  . Heart attack Paternal Grandfather   . Stroke Maternal Grandfather     Vitals:   07/10/18 1518    BP: (!) 144/96  Pulse: 89  SpO2: 97%  Weight: 90.1 kg (198 lb 9.6 oz)   Wt Readings from Last 3 Encounters:  07/10/18 90.1 kg (198 lb 9.6 oz)  12/16/17 87.8 kg (193 lb 9.6 oz)  12/06/17 87.7 kg (193 lb 6.4 oz)    PHYSICAL EXAM: General:  Well appearing. No resp difficulty. Hoarse voice HEENT: normal Neck: supple. no JVD. Carotids 2+ bilat; no bruits. No lymphadenopathy or thryomegaly appreciated. Cor: PMI nondisplaced. Regular rate & rhythm. No rubs, gallops or murmurs. Lungs: clear mildly decreased BS Abdomen:  soft, nontender, nondistended. No hepatosplenomegaly. No bruits or masses. Good bowel sounds. Extremities: no cyanosis, clubbing, rash, edema Neuro: alert & orientedx3, cranial nerves grossly intact. moves all 4 extremities w/o difficulty. Affect pleasant   ASSESSMENT & PLAN:  1. Chronic systolic HF with biventricular dysfunction: Echo 05/22/17 with EF 15-20% with severe RV dysfunction. NICM. Cath 8/18 mild nonobstructive CAD - Echo reviewed personally. EF remains 60-65%. RV size upper end of normal but normal function and no evidence of PAH - Volume stable on exam - Increase Entresto to 97/103 mg BID due to HTN - Continue Spiro 25 mg daily - Continue Coreg 3.125 mg BID  2. RIJ/sublcavianDVT with bilateral PE  - VQ 05/23/17 with bilateral perfusion defects. - CT chest 05/27/17 with bilateral PE but no clot in main PAs. No RV strain - Discussed dropping apixiban to 2.5 bid based on Amplify-EXT trial but she wants to stay on 5 bid - Given RV size will get VQ to make sure she has resolution of PEs   3. Tobacco use -  Continues to be abstinent  4. Lymph node biopsy suggestive of metastatic carcinoma.  - S/P lymph node biopsy 05/30/17. Pathology concerning for metastatic carcinoma however further w/u has been unrevealing  - No evidence of Breast CA on Mammogram or [place] 06/06/17. - CEA mildly positive and CA-125 positive. - PET scan without site of primary neoplasm. -  Colonoscopy negative - Repeat LN biopsy negative   Glori Bickers, MD 07/10/18

## 2018-07-10 NOTE — Patient Instructions (Addendum)
Increase Entresto 97/103 mg Twice daily   VQ and Chest X-ray, we will call you to schedule once insurance has approved  We will contact you in 1 year to schedule your next appointment.

## 2018-07-14 ENCOUNTER — Other Ambulatory Visit: Payer: Self-pay | Admitting: Family Medicine

## 2018-07-17 DIAGNOSIS — N939 Abnormal uterine and vaginal bleeding, unspecified: Secondary | ICD-10-CM | POA: Diagnosis not present

## 2018-07-17 DIAGNOSIS — Z01419 Encounter for gynecological examination (general) (routine) without abnormal findings: Secondary | ICD-10-CM | POA: Diagnosis not present

## 2018-07-17 DIAGNOSIS — N951 Menopausal and female climacteric states: Secondary | ICD-10-CM | POA: Diagnosis not present

## 2018-07-17 DIAGNOSIS — R87612 Low grade squamous intraepithelial lesion on cytologic smear of cervix (LGSIL): Secondary | ICD-10-CM | POA: Diagnosis not present

## 2018-07-22 ENCOUNTER — Telehealth (HOSPITAL_COMMUNITY): Payer: Self-pay | Admitting: Vascular Surgery

## 2018-07-22 NOTE — Telephone Encounter (Signed)
Left pt message giving VQ scan appt 07/25/18 @ 12:30, asked pt to call back to confirm appt

## 2018-07-25 ENCOUNTER — Ambulatory Visit (HOSPITAL_COMMUNITY): Payer: Medicaid Other

## 2018-07-25 ENCOUNTER — Other Ambulatory Visit (HOSPITAL_COMMUNITY): Payer: Self-pay

## 2018-07-25 ENCOUNTER — Encounter (HOSPITAL_COMMUNITY): Payer: Medicaid Other

## 2018-07-25 MED ORDER — CARVEDILOL 3.125 MG PO TABS: ORAL_TABLET | ORAL | 5 refills | Status: DC

## 2018-08-20 ENCOUNTER — Other Ambulatory Visit: Payer: Self-pay | Admitting: Family Medicine

## 2018-08-20 ENCOUNTER — Ambulatory Visit
Admission: RE | Admit: 2018-08-20 | Discharge: 2018-08-20 | Disposition: A | Discharge status: Home / Self Care | Payer: Medicaid Other | Source: Ambulatory Visit

## 2018-08-20 DIAGNOSIS — Z1231 Encounter for screening mammogram for malignant neoplasm of breast: Secondary | ICD-10-CM | POA: Diagnosis not present

## 2018-08-22 ENCOUNTER — Telehealth: Payer: Self-pay

## 2018-08-22 NOTE — Telephone Encounter (Signed)
Left a vm for patient to callback 

## 2018-08-22 NOTE — Telephone Encounter (Signed)
-----   Message from Azzie Glatter, [place] sent at 08/21/2018 10:10 PM EST ----- Regarding: "Mammogram Results" Results are negative for Malignancy. Please inform patient.   Thanks.

## 2018-08-25 NOTE — Telephone Encounter (Signed)
-----   Message from Kylie Bennett, [place] sent at 08/21/2018 10:10 PM EST ----- Regarding: "Mammogram Results" Results are negative for Malignancy. Please inform patient.   Thanks.

## 2018-08-25 NOTE — Telephone Encounter (Signed)
Left vm for patient to call back.

## 2018-09-18 ENCOUNTER — Other Ambulatory Visit: Payer: Self-pay | Admitting: Family Medicine

## 2018-09-20 IMAGING — NM NM PULMONARY VENT & PERF
13 series · 13 of 13 positions shown · non-contrast
Comparison: Chest radiograph 05/23/2017

CLINICAL DATA: Positive D-dimer suspect pulmonary embolus

EXAM:
NUCLEAR MEDICINE VENTILATION - PERFUSION LUNG SCAN
TECHNIQUE: Ventilation images were obtained in multiple projections using
inhaled aerosol 7c-IIm DTPA. Perfusion images were obtained in
multiple projections after intravenous injection of 7c-IIm MAA.
RADIOPHARMACEUTICALS:  30 mCi 0echnetium-JJm DTPA aerosol inhalation
and 4 mCi 0echnetium-JJm MAA IV

[Series 1: ant/post vent · 4.14mm/px · 1 of 1 slices shown]
[im 1/1]
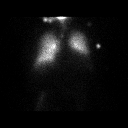

[Series 2: lao/rpo vent · 4.14mm/px · 1 of 1 slices shown (1 of 2)]
[im 1/1]
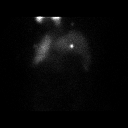

[Series 2: lao/rpo vent · 4.14mm/px · 1 of 1 slices shown (2 of 2)]
[im 1/1]
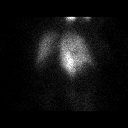

[Series 3: lpo/rao vent · 4.14mm/px · 1 of 1 slices shown (1 of 2)]
[im 1/1]
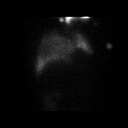

[Series 3: lpo/rao vent · 4.14mm/px · 1 of 1 slices shown (2 of 2)]
[im 1/1]
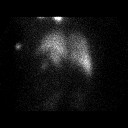

[Series 4: lt lat/rt lat vent · 4.14mm/px · 1 of 1 slices shown (1 of 2)]
[im 1/1]
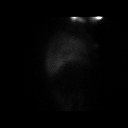

[Series 4: lt lat/rt lat vent · 4.14mm/px · 1 of 1 slices shown (2 of 2)]
[im 1/1]
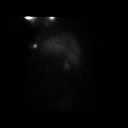

[Series 5: lt lat/rt lat perf · 4.14mm/px · 1 of 1 slices shown (1 of 2)]
[im 1/1]
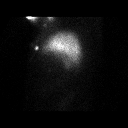

[Series 5: lt lat/rt lat perf · 4.14mm/px · 1 of 1 slices shown (2 of 2)]
[im 1/1]
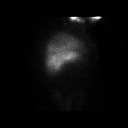

[Series 6: lpo/rao perf · 4.14mm/px · 1 of 1 slices shown (1 of 2)]
[im 1/1]
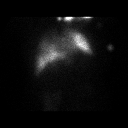

[Series 6: lpo/rao perf · 4.14mm/px · 1 of 1 slices shown (2 of 2)]
[im 1/1]
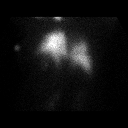

[Series 7: ant/post perf · 4.14mm/px · 1 of 1 slices shown]
[im 1/1]
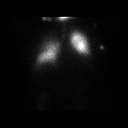

[Series 8: lao/rpo perf · 4.14mm/px · 1 of 1 slices shown]
[im 1/1]
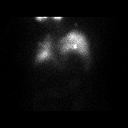

[13 of 13 positions shown; findings below may reference images not displayed]

FINDINGS: Ventilation: Large defect in the left lower lobe, corresponding to
radiographic abnormality.

Perfusion: At least 2 large peripheral defects in the apical and
lateral aspect of the right lung with relatively normal perfusion
and no corresponding opacity on radiograph. Large matched defect in
the left lower lobe as well.
IMPRESSION: Findings are consistent with high probability for pulmonary embolus.

Critical Value/emergent results were called by telephone at the time
of interpretation on 05/23/2017 at [DATE] to Dr. Ngwoh Konda, who
verbally acknowledged these results.

## 2018-09-23 ENCOUNTER — Other Ambulatory Visit (HOSPITAL_COMMUNITY): Payer: Self-pay

## 2018-09-23 MED ORDER — SPIRONOLACTONE 25 MG PO TABS: 25.0000 mg | ORAL_TABLET | Freq: Every day | ORAL | 2 refills | Status: DC

## 2018-09-24 ENCOUNTER — Other Ambulatory Visit (HOSPITAL_COMMUNITY): Payer: Self-pay | Admitting: *Deleted

## 2018-10-27 ENCOUNTER — Other Ambulatory Visit (HOSPITAL_COMMUNITY): Payer: Self-pay

## 2018-10-27 MED ORDER — FUROSEMIDE 20 MG PO TABS: 20.0000 mg | ORAL_TABLET | Freq: Every day | ORAL | 1 refills | Status: DC

## 2018-10-29 ENCOUNTER — Other Ambulatory Visit (HOSPITAL_COMMUNITY): Payer: Self-pay

## 2018-12-30 ENCOUNTER — Other Ambulatory Visit (HOSPITAL_COMMUNITY): Payer: Self-pay | Admitting: Internal Medicine

## 2019-01-31 ENCOUNTER — Other Ambulatory Visit (HOSPITAL_COMMUNITY): Payer: Self-pay | Admitting: Internal Medicine

## 2019-04-19 ENCOUNTER — Other Ambulatory Visit (HOSPITAL_COMMUNITY): Payer: Self-pay | Admitting: Internal Medicine

## 2019-04-27 ENCOUNTER — Telehealth (HOSPITAL_COMMUNITY): Payer: Self-pay | Admitting: Pharmacy Technician

## 2019-04-27 NOTE — Telephone Encounter (Signed)
  Received notification from [facility] that prior authorization for Kylie Bennett is required.  PA submitted on [employer] [id number] W Status is pending  Charlann Boxer, CPhT

## 2019-04-27 NOTE — Telephone Encounter (Signed)
Patient was approved through [facility] for Entresto from 04/27/2019-04/21/2020.

## 2019-05-24 ENCOUNTER — Other Ambulatory Visit (HOSPITAL_COMMUNITY): Payer: Self-pay | Admitting: Internal Medicine

## 2019-06-05 ENCOUNTER — Other Ambulatory Visit (HOSPITAL_COMMUNITY): Payer: Self-pay | Admitting: Internal Medicine

## 2019-08-10 ENCOUNTER — Other Ambulatory Visit (HOSPITAL_COMMUNITY): Payer: Self-pay

## 2019-08-10 MED ORDER — ENTRESTO 97-103 MG PO TABS: 1.0000 | ORAL_TABLET | Freq: Two times a day (BID) | ORAL | 6 refills | Status: DC

## 2019-08-11 ENCOUNTER — Other Ambulatory Visit (HOSPITAL_COMMUNITY): Payer: Self-pay

## 2019-08-11 MED ORDER — ENTRESTO 97-103 MG PO TABS: 1.0000 | ORAL_TABLET | Freq: Two times a day (BID) | ORAL | 6 refills | Status: DC

## 2019-10-19 ENCOUNTER — Other Ambulatory Visit (HOSPITAL_COMMUNITY): Payer: Self-pay

## 2019-10-19 MED ORDER — FUROSEMIDE 20 MG PO TABS: ORAL_TABLET | ORAL | 1 refills | Status: DC

## 2019-12-17 ENCOUNTER — Other Ambulatory Visit (HOSPITAL_COMMUNITY): Payer: Self-pay

## 2019-12-17 MED ORDER — CARVEDILOL 3.125 MG PO TABS: ORAL_TABLET | ORAL | 0 refills | Status: DC

## 2020-01-19 ENCOUNTER — Other Ambulatory Visit (HOSPITAL_COMMUNITY): Payer: Self-pay

## 2020-02-25 ENCOUNTER — Other Ambulatory Visit: Payer: Self-pay

## 2020-03-02 ENCOUNTER — Other Ambulatory Visit (HOSPITAL_COMMUNITY): Payer: Self-pay

## 2020-03-02 MED ORDER — CARVEDILOL 3.125 MG PO TABS: ORAL_TABLET | ORAL | 0 refills | Status: DC

## 2020-03-02 MED ORDER — FUROSEMIDE 20 MG PO TABS: ORAL_TABLET | ORAL | 0 refills | Status: DC

## 2020-03-02 MED ORDER — SPIRONOLACTONE 25 MG PO TABS: ORAL_TABLET | ORAL | 0 refills | Status: DC

## 2020-03-02 NOTE — Telephone Encounter (Signed)
Patient called to inquire about why her medications weren't refilled. Upon investigating patients chart I advised patient that she needed to schedule an appointment for further refills since we haven't seen her since 2019. appt was scheduled and medications were electronically sent into patients pharmacy on file. I also advised patient that she must keep her pending appt for further refills  Meds ordered this encounter  Medications  . spironolactone (ALDACTONE) 25 MG tablet    Sig: TAKE 1 TABLET(25 MG) BY MOUTH DAILY must keep pending appt for further refills    Dispense:  90 tablet    Refill:  0  . carvedilol (COREG) 3.125 MG tablet    Sig: TAKE 1 TABLET BY MOUTH TWICE DAILY WITH MEALS AS DIRECTED. MUST KEEP PENDING APPT FOR REFILLS    Dispense:  180 tablet    Refill:  0    NEED an appointment for future refills.  . furosemide (LASIX) 20 MG tablet    Sig: TAKE 1 TABLET(20 MG) BY MOUTH DAILY. MUST KEEP PENDING APPT FOR FURTHER REFILLS    Dispense:  90 tablet    Refill:  0

## 2020-04-20 ENCOUNTER — Other Ambulatory Visit (HOSPITAL_COMMUNITY): Payer: Self-pay | Admitting: *Deleted

## 2020-04-29 ENCOUNTER — Other Ambulatory Visit (HOSPITAL_COMMUNITY): Payer: Self-pay | Admitting: *Deleted

## 2020-04-29 MED ORDER — ENTRESTO 97-103 MG PO TABS: 1.0000 | ORAL_TABLET | Freq: Two times a day (BID) | ORAL | 0 refills | Status: DC

## 2020-05-23 ENCOUNTER — Telehealth (HOSPITAL_COMMUNITY): Payer: Self-pay

## 2020-05-23 ENCOUNTER — Telehealth (HOSPITAL_COMMUNITY): Payer: Self-pay | Admitting: Pharmacy Technician

## 2020-05-23 NOTE — Telephone Encounter (Signed)
Patient Advocate Encounter   Received notification from [facility] that prior authorization for Kylie Bennett is required.   PA submitted on CoverMyMeds Key [id number] Status is pending   Will continue to follow.

## 2020-05-23 NOTE — Telephone Encounter (Signed)
Patient called stating that she needs a pre auth for entresto since her insurance changed to [place]. Patient would like a callback once this is complete

## 2020-05-24 ENCOUNTER — Ambulatory Visit (HOSPITAL_COMMUNITY)
Admission: RE | Admit: 2020-05-24 | Discharge: 2020-05-24 | Disposition: A | Discharge status: Home / Self Care | Payer: Medicaid Other | Source: Ambulatory Visit | Attending: Internal Medicine | Admitting: Internal Medicine

## 2020-05-24 ENCOUNTER — Other Ambulatory Visit: Payer: Self-pay

## 2020-05-24 ENCOUNTER — Other Ambulatory Visit (HOSPITAL_COMMUNITY): Payer: Self-pay | Admitting: Internal Medicine

## 2020-05-24 VITALS — BP 108/68 | HR 112 | Wt 229.0 lb

## 2020-05-24 DIAGNOSIS — I428 Other cardiomyopathies: Secondary | ICD-10-CM | POA: Diagnosis not present

## 2020-05-24 DIAGNOSIS — I5022 Chronic systolic (congestive) heart failure: Secondary | ICD-10-CM

## 2020-05-24 DIAGNOSIS — I2510 Atherosclerotic heart disease of native coronary artery without angina pectoris: Secondary | ICD-10-CM | POA: Diagnosis not present

## 2020-05-24 DIAGNOSIS — I2699 Other pulmonary embolism without acute cor pulmonale: Secondary | ICD-10-CM

## 2020-05-24 DIAGNOSIS — Z86711 Personal history of pulmonary embolism: Secondary | ICD-10-CM | POA: Insufficient documentation

## 2020-05-24 DIAGNOSIS — Z8589 Personal history of malignant neoplasm of other organs and systems: Secondary | ICD-10-CM | POA: Diagnosis not present

## 2020-05-24 DIAGNOSIS — Z87891 Personal history of nicotine dependence: Secondary | ICD-10-CM | POA: Insufficient documentation

## 2020-05-24 DIAGNOSIS — K219 Gastro-esophageal reflux disease without esophagitis: Secondary | ICD-10-CM | POA: Insufficient documentation

## 2020-05-24 DIAGNOSIS — Z7901 Long term (current) use of anticoagulants: Secondary | ICD-10-CM | POA: Diagnosis not present

## 2020-05-24 DIAGNOSIS — I110 Hypertensive heart disease with heart failure: Secondary | ICD-10-CM | POA: Diagnosis not present

## 2020-05-24 DIAGNOSIS — Z86718 Personal history of other venous thrombosis and embolism: Secondary | ICD-10-CM | POA: Diagnosis not present

## 2020-05-24 DIAGNOSIS — J90 Pleural effusion, not elsewhere classified: Secondary | ICD-10-CM | POA: Diagnosis not present

## 2020-05-24 DIAGNOSIS — Z79899 Other long term (current) drug therapy: Secondary | ICD-10-CM | POA: Insufficient documentation

## 2020-05-24 MED ORDER — CARVEDILOL 3.125 MG PO TABS: ORAL_TABLET | ORAL | 0 refills | Status: DC

## 2020-05-24 NOTE — Progress Notes (Signed)
Advanced Heart Failure Clinic Note   Primary Cardiologist: Dr. Meda Coffee Primary HF: Dr. Haroldine Laws   HPI:  Kylie Bennett is a 54 y.o. 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   Admitted 05/21/17 to 06/01/17 with SOB. Found to have bilateral PEs and right subclavian DVTs involving the head and neck. CT head/neck showed a mass of lymph nodes. US biopsy confirmed metastatic carcinoma but unable to locate primary.  Echo with EF 15-20%. She was diuresed 40 lbs.  PET scan 06/13/17 without site of primary neoplasm. Colonoscopy negative.   Underwent repeat LN biopsy 12/18; The core biopsies consist of benign lymph node tissue with attached fibroadipose tissue. Immunohistochemistry for cytokeratin AE1/AE3 and cytokeratin 7 are negative. At the request of Dr. Lindi Adie the previous left supraclavicular lymph node biopsy on 06/03/17 [phone]) is reviewed and shows metastatic carcinoma positive with cytokeratin AE1/AE3 and cytokeratin 7.  F/u biopsy of LN ws negative. She has been cleared.   Returns today for f/u with her husband. We have not seen her since 10/19. Remains on disability. Feels ok. Able to do housework, laundry and go to store. No edema, orthopnea or PND. Taking lasix 20mg  daily.   Echo 10/19  EF 60-65% RV mildly dilated but normal function and no evidence of PAH. Personally reviewed   Studies:  Echo 05/22/2017  EF 15-20% with severe RV dysfunction. NICM  [facility] 05/27/17  Mid RCA lesion, 20 %stenosed.  Dist LAD lesion, 40 %stenosed. RHC hemodynamics AO = 123/89 (104) LV= 124/13 RA = 7 RV = 42/7 PA = 45/20 (29) PCW = 11 Fick cardiac output/index = 5.4/2.7 PVR = 3.3 WU SVR 1448 FA sat = 95% PA sat = 65%, 68%  CT chest 05/27/17 (Personally reviewed)  IMPRESSION: 1. Bilateral pulmonary emboli in the lower lobes and perhaps the right upper lobe. No heart strain identified. 2. Bibasilar pulmonary  opacities. These opacities could represent pneumonia. Aspiration is possible. Pulmonary infarcts cannot be excluded given the pulmonary emboli. Recommend follow-up to resolution. 3. Coronary artery calcifications. 4. Mildly enlarged nodes and increased attenuation in the fat of the left axilla. This is a nonspecific finding. Recommend clinical correlation and short-term follow-up to resolution. 5. Shotty nodes in the mediastinum may be reactive given the findings in the lungs. 6. Small pleural effusions. Small pericardial effusion.  Review of systems complete and found to be negative unless listed in HPI.    Past Medical History:  Diagnosis Date  . Cancer [facility])    metastatic carcinoma  . CHF (congestive heart failure) ([place])   . Diarrhea   . DVT (deep venous thrombosis) ([place])   . GERD (gastroesophageal reflux disease)   . Hypertension   . Pulmonary embolism ([place])   . Seasonal allergies     Current Outpatient Medications  Medication Sig Dispense Refill  . acetaminophen (TYLENOL) 325 MG tablet Take 650 mg by mouth every 6 (six) hours as needed for mild pain.    . carvedilol (COREG) 3.125 MG tablet TAKE 1 TABLET BY MOUTH TWICE DAILY WITH MEALS AS DIRECTED. MUST KEEP PENDING APPT FOR REFILLS 180 tablet 0  . ELIQUIS 5 MG TABS tablet TAKE 1 TABLET(5 MG) BY MOUTH TWICE DAILY 60 tablet 11  . fluticasone (FLONASE) 50 MCG/ACT nasal spray Place 2 sprays into both nostrils daily as needed for allergies.     . furosemide (LASIX) 20 MG tablet TAKE 1 TABLET(20 MG) BY MOUTH DAILY. MUST  KEEP PENDING APPT FOR FURTHER REFILLS 90 tablet 0  . GuaiFENesin (MUCINEX PO) Take 1,500 mg by mouth daily.    . magnesium oxide (MAG-OX) 400 (241.3 Mg) MG tablet Take 1 tablet (400 mg total) by mouth 2 (two) times daily. 60 tablet 0  . omeprazole (PRILOSEC) 20 MG capsule Take 20 mg by mouth daily.    . sacubitril-valsartan (ENTRESTO) 97-103 MG Take 1 tablet by mouth 2 (two) times daily. 60 tablet 0  .  spironolactone (ALDACTONE) 25 MG tablet TAKE 1 TABLET(25 MG) BY MOUTH DAILY must keep pending appt for further refills 90 tablet 0   No current facility-administered medications for this encounter.    Allergies  Allergen Reactions  . Unasyn [Ampicillin-Sulbactam Sodium] Other (See Comments)    Pt gets hot/flushed with infusion       Social History   Socioeconomic History  . Marital status: Single    Spouse name: Not on file  . Number of children: Not on file  . Years of education: Not on file  . Highest education level: Not on file  Occupational History  . Not on file  Tobacco Use  . Smoking status: Former Smoker    Packs/day: 1.00    Types: Cigarettes  . Smokeless tobacco: Never Used  . Tobacco comment: quit smoking June 2018  Vaping Use  . Vaping Use: Never used  Substance and Sexual Activity  . Alcohol use: Yes    Comment: occasional  . Drug use: No  . Sexual activity: Not on file  Other Topics Concern  . Not on file  Social History Narrative  . Not on file   Social Determinants of Health   Financial Resource Strain:   . Difficulty of Paying Living Expenses:   Food Insecurity:   . Worried About Charity fundraiser in the Last Year:   . Arboriculturist in the Last Year:   Transportation Needs:   . Film/video editor (Medical):   Marland Kitchen Lack of Transportation (Non-Medical):   Physical Activity:   . Days of Exercise per Week:   . Minutes of Exercise per Session:   Stress:   . Feeling of Stress :   Social Connections:   . Frequency of Communication with Friends and Family:   . Frequency of Social Gatherings with Friends and Family:   . Attends Religious Services:   . Active Member of Clubs or Organizations:   . Attends Archivist Meetings:   Marland Kitchen Marital Status:   Intimate Partner Violence:   . Fear of Current or Ex-Partner:   . Emotionally Abused:   Marland Kitchen Physically Abused:   . Sexually Abused:       Family History  Problem Relation Age of  Onset  . COPD Mother   . Rheum arthritis Mother   . Breast cancer Mother   . Hypertension Father   . Skin cancer Father   . Heart attack Father        age 61s  . Heart attack Paternal Grandfather   . Stroke Maternal Grandfather     Vitals:   05/24/20 1310  BP: 108/68  Pulse: (!) 112  SpO2: 96%  Weight: 103.9 kg (229 lb)   Wt Readings from Last 3 Encounters:  05/24/20 103.9 kg (229 lb)  07/10/18 90.1 kg (198 lb 9.6 oz)  12/16/17 87.8 kg (193 lb 9.6 oz)    PHYSICAL EXAM: General:  Well appearing. No resp difficulty HEENT: normal Neck: supple. no JVD. Carotids  2+ bilat; no bruits. No lymphadenopathy or thryomegaly appreciated. Cor: PMI nondisplaced. Regular. tachy. No rubs, gallops or murmurs. Lungs: clear Abdomen: soft, nontender, nondistended. No hepatosplenomegaly. No bruits or masses. Good bowel sounds. Extremities: no cyanosis, clubbing, rash, edema Neuro: alert & orientedx3, cranial nerves grossly intact. moves all 4 extremities w/o difficulty. Affect pleasant  Sinus tach 112 No ST-T wave abnormalities.  Personally reviewed  ASSESSMENT & PLAN:  1. Chronic systolic HF with biventricular dysfunction: Echo 05/22/17 with EF 15-20% with severe RV dysfunction. NICM. Cath 8/18 mild nonobstructive CAD - Echo 10/19 EF remains 60-65%. RV size upper end of normal but normal function and no evidence of PAH - Volume stable on exam continue lasix 20 daily  - Continue Entresto 97/103 mg BID - Continue Spiro 25 mg daily - Continue Coreg 3.125 mg BID - Will repeat echo to ensure stability of EF   2. RIJ/sublcavianDVT with bilateral PE  - VQ 05/23/17 with bilateral perfusion defects. - CT chest 05/27/17 with bilateral PE but no clot in main PAs. No RV strain - Previously discussed dropping apixiban to 2.5 bid based on Amplify-EXT trial but she wants to stay on 5 bid  3. Tobacco use -  Continues to be abstinent  4. Lymph node biopsy suggestive of metastatic carcinoma.  - S/P  lymph node biopsy 05/30/17. Pathology concerning for metastatic carcinoma however further w/u has been unrevealing  - No evidence of Breast CA on Mammogram or [place] 06/06/17. - CEA mildly positive and CA-125 positive. - PET scan without site of primary neoplasm. - Colonoscopy negative - Repeat LN biopsy negative - Now cleared    Glori Bickers, MD 05/24/20

## 2020-05-24 NOTE — Patient Instructions (Signed)
Your physician has requested that you have an echocardiogram. Echocardiography is a painless test that uses sound waves to create images of your heart. It provides your doctor with information about the size and shape of your heart and how well your hearts chambers and valves are working. This procedure takes approximately one hour. There are no restrictions for this procedure.  Call our office in 1 year (august 2022) to schedule your follow up appointment  If you have any questions or concerns before your next appointment please send [place] a message through [place] or call our office at [phone].    TO LEAVE A MESSAGE FOR THE NURSE SELECT OPTION 2, PLEASE LEAVE A MESSAGE INCLUDING:  YOUR NAME  DATE OF BIRTH  CALL BACK NUMBER  REASON FOR CALL**this is important as we prioritize the call backs  YOU WILL RECEIVE A CALL BACK THE SAME DAY AS LONG AS YOU CALL BEFORE 4:00 PM   At the [facility], you and your health needs are our priority. As part of our continuing mission to provide you with exceptional heart care, we have created designated Provider Care Teams. These Care Teams include your primary Cardiologist (physician) and Advanced Practice Providers (APPs- Physician Assistants and Nurse Practitioners) who all work together to provide you with the care you need, when you need it.   You may see any of the following providers on your designated Care Team at your next follow up:  Dr Glori Bickers  Dr Haynes Kerns, NP  Lyda Jester, [place]  Audry Riles, PharmD   Please be sure to bring in all your medications bottles to every appointment.

## 2020-05-24 NOTE — Telephone Encounter (Signed)
Advanced Heart Failure Patient Advocate Encounter  Prior Authorization for Delene Loll has been approved.    PA# [id number] Effective dates: 05/23/20 through 05/23/21  Patients co-pay is $3.00  The patient is aware.  Charlann Boxer, CPhT

## 2020-06-07 ENCOUNTER — Ambulatory Visit (HOSPITAL_COMMUNITY): Payer: Medicaid Other

## 2020-06-15 ENCOUNTER — Other Ambulatory Visit (HOSPITAL_COMMUNITY): Payer: Self-pay | Admitting: Cardiology

## 2020-06-15 MED ORDER — SPIRONOLACTONE 25 MG PO TABS: 25.0000 mg | ORAL_TABLET | Freq: Every day | ORAL | 0 refills | Status: DC

## 2020-06-16 ENCOUNTER — Other Ambulatory Visit (HOSPITAL_COMMUNITY): Payer: Self-pay

## 2020-06-16 MED ORDER — SPIRONOLACTONE 25 MG PO TABS: 25.0000 mg | ORAL_TABLET | Freq: Every day | ORAL | 0 refills | Status: DC

## 2020-07-22 ENCOUNTER — Other Ambulatory Visit (HOSPITAL_COMMUNITY): Payer: Self-pay | Admitting: *Deleted

## 2020-07-22 MED ORDER — FUROSEMIDE 20 MG PO TABS: ORAL_TABLET | ORAL | 0 refills | Status: DC

## 2020-08-16 ENCOUNTER — Other Ambulatory Visit (HOSPITAL_COMMUNITY): Payer: Self-pay | Admitting: *Deleted

## 2020-08-16 MED ORDER — APIXABAN 5 MG PO TABS: ORAL_TABLET | ORAL | 11 refills | Status: DC

## 2020-08-19 ENCOUNTER — Other Ambulatory Visit (HOSPITAL_COMMUNITY): Payer: Self-pay | Admitting: Internal Medicine

## 2020-09-13 ENCOUNTER — Other Ambulatory Visit (HOSPITAL_COMMUNITY): Payer: Self-pay | Admitting: *Deleted

## 2020-09-13 MED ORDER — SPIRONOLACTONE 25 MG PO TABS: 25.0000 mg | ORAL_TABLET | Freq: Every day | ORAL | 0 refills | Status: DC

## 2020-09-19 ENCOUNTER — Other Ambulatory Visit (HOSPITAL_COMMUNITY): Payer: Self-pay | Admitting: *Deleted

## 2020-09-19 MED ORDER — ENTRESTO 97-103 MG PO TABS: 1.0000 | ORAL_TABLET | Freq: Two times a day (BID) | ORAL | 0 refills | Status: DC

## 2020-09-23 ENCOUNTER — Other Ambulatory Visit (HOSPITAL_COMMUNITY): Payer: Self-pay

## 2020-10-18 ENCOUNTER — Other Ambulatory Visit (HOSPITAL_COMMUNITY): Payer: Self-pay | Admitting: *Deleted

## 2020-10-18 ENCOUNTER — Other Ambulatory Visit (HOSPITAL_COMMUNITY): Payer: Self-pay | Admitting: Internal Medicine

## 2020-10-18 MED ORDER — ENTRESTO 97-103 MG PO TABS: 1.0000 | ORAL_TABLET | Freq: Two times a day (BID) | ORAL | 0 refills | Status: DC

## 2020-11-15 ENCOUNTER — Other Ambulatory Visit (HOSPITAL_COMMUNITY): Payer: Self-pay | Admitting: Internal Medicine

## 2020-12-15 ENCOUNTER — Other Ambulatory Visit (HOSPITAL_COMMUNITY): Payer: Self-pay | Admitting: Internal Medicine

## 2021-01-14 ENCOUNTER — Other Ambulatory Visit (HOSPITAL_COMMUNITY): Payer: Self-pay | Admitting: Internal Medicine

## 2021-03-17 ENCOUNTER — Other Ambulatory Visit (HOSPITAL_COMMUNITY): Payer: Self-pay | Admitting: Internal Medicine

## 2021-05-18 ENCOUNTER — Other Ambulatory Visit (HOSPITAL_COMMUNITY): Payer: Self-pay | Admitting: Internal Medicine

## 2021-07-17 ENCOUNTER — Other Ambulatory Visit (HOSPITAL_COMMUNITY): Payer: Self-pay | Admitting: Internal Medicine

## 2021-08-08 DEATH — deceased
